# Patient Record
Sex: Female | Born: 1965 | Race: White | Hispanic: No | Smoking: Current every day smoker
Health system: Southern US, Community
[De-identification: ages and names within clinical notes are randomized; demographics above are authoritative.]

## PROBLEM LIST (undated history)

## (undated) DIAGNOSIS — G43909 Migraine, unspecified, not intractable, without status migrainosus: Secondary | ICD-10-CM

## (undated) DIAGNOSIS — IMO0001 Reserved for inherently not codable concepts without codable children: Secondary | ICD-10-CM

## (undated) DIAGNOSIS — E119 Type 2 diabetes mellitus without complications: Secondary | ICD-10-CM

## (undated) DIAGNOSIS — F419 Anxiety disorder, unspecified: Secondary | ICD-10-CM

## (undated) DIAGNOSIS — K219 Gastro-esophageal reflux disease without esophagitis: Secondary | ICD-10-CM

## (undated) HISTORY — DX: Reserved for inherently not codable concepts without codable children: IMO0001

## (undated) HISTORY — DX: Migraine, unspecified, not intractable, without status migrainosus: G43.909

## (undated) HISTORY — DX: Anxiety disorder, unspecified: F41.9

## (undated) HISTORY — DX: Gastro-esophageal reflux disease without esophagitis: K21.9

## (undated) HISTORY — DX: Type 2 diabetes mellitus without complications: E11.9

---

## 1997-08-25 ENCOUNTER — Ambulatory Visit (HOSPITAL_COMMUNITY): Admission: RE | Admit: 1997-08-25 | Discharge: 1997-08-25 | Payer: Self-pay | Admitting: Obstetrics and Gynecology

## 1998-01-19 ENCOUNTER — Inpatient Hospital Stay (HOSPITAL_COMMUNITY): Admission: AD | Admit: 1998-01-19 | Discharge: 1998-01-23 | Payer: Self-pay | Admitting: Obstetrics and Gynecology

## 1999-07-28 ENCOUNTER — Other Ambulatory Visit: Admission: RE | Admit: 1999-07-28 | Discharge: 1999-07-28 | Payer: Self-pay | Admitting: *Deleted

## 2000-07-11 ENCOUNTER — Encounter: Payer: Self-pay | Admitting: Emergency Medicine

## 2000-07-11 ENCOUNTER — Emergency Department (HOSPITAL_COMMUNITY): Admission: EM | Admit: 2000-07-11 | Discharge: 2000-07-11 | Payer: Self-pay | Admitting: Emergency Medicine

## 2001-08-30 ENCOUNTER — Other Ambulatory Visit: Admission: RE | Admit: 2001-08-30 | Discharge: 2001-08-30 | Payer: Self-pay | Admitting: Obstetrics and Gynecology

## 2002-04-30 ENCOUNTER — Encounter (HOSPITAL_COMMUNITY): Payer: Self-pay | Admitting: Oncology

## 2002-04-30 ENCOUNTER — Ambulatory Visit (HOSPITAL_COMMUNITY): Admission: RE | Admit: 2002-04-30 | Discharge: 2002-04-30 | Payer: Self-pay | Admitting: Oncology

## 2002-08-06 ENCOUNTER — Encounter: Admission: RE | Admit: 2002-08-06 | Discharge: 2002-08-06 | Payer: Self-pay | Admitting: Obstetrics and Gynecology

## 2002-08-06 ENCOUNTER — Encounter: Payer: Self-pay | Admitting: Obstetrics and Gynecology

## 2002-10-04 ENCOUNTER — Other Ambulatory Visit: Admission: RE | Admit: 2002-10-04 | Discharge: 2002-10-04 | Payer: Self-pay | Admitting: Obstetrics and Gynecology

## 2003-07-28 ENCOUNTER — Emergency Department (HOSPITAL_COMMUNITY): Admission: EM | Admit: 2003-07-28 | Discharge: 2003-07-28 | Payer: Self-pay | Admitting: Emergency Medicine

## 2003-08-14 ENCOUNTER — Ambulatory Visit (HOSPITAL_COMMUNITY): Admission: RE | Admit: 2003-08-14 | Discharge: 2003-08-15 | Payer: Self-pay | Admitting: Orthopaedic Surgery

## 2003-12-15 ENCOUNTER — Encounter: Admission: RE | Admit: 2003-12-15 | Discharge: 2003-12-15 | Payer: Self-pay | Admitting: Obstetrics and Gynecology

## 2005-03-16 ENCOUNTER — Other Ambulatory Visit: Admission: RE | Admit: 2005-03-16 | Discharge: 2005-03-16 | Payer: Self-pay | Admitting: Obstetrics and Gynecology

## 2005-04-21 ENCOUNTER — Encounter: Admission: RE | Admit: 2005-04-21 | Discharge: 2005-04-21 | Payer: Self-pay | Admitting: Obstetrics and Gynecology

## 2005-04-21 ENCOUNTER — Ambulatory Visit: Payer: Self-pay | Admitting: Gastroenterology

## 2005-04-26 ENCOUNTER — Ambulatory Visit: Payer: Self-pay | Admitting: Gastroenterology

## 2005-05-02 ENCOUNTER — Encounter (INDEPENDENT_AMBULATORY_CARE_PROVIDER_SITE_OTHER): Payer: Self-pay | Admitting: Specialist

## 2005-05-02 ENCOUNTER — Ambulatory Visit: Payer: Self-pay | Admitting: Gastroenterology

## 2005-06-28 ENCOUNTER — Ambulatory Visit: Payer: Self-pay | Admitting: Gastroenterology

## 2006-02-14 ENCOUNTER — Ambulatory Visit (HOSPITAL_COMMUNITY): Admission: RE | Admit: 2006-02-14 | Discharge: 2006-02-14 | Payer: Self-pay | Admitting: Family Medicine

## 2006-08-03 ENCOUNTER — Encounter: Admission: RE | Admit: 2006-08-03 | Discharge: 2006-08-03 | Payer: Self-pay | Admitting: Family Medicine

## 2006-10-11 ENCOUNTER — Encounter (HOSPITAL_COMMUNITY): Admission: RE | Admit: 2006-10-11 | Discharge: 2006-11-10 | Payer: Self-pay | Admitting: Oncology

## 2006-10-11 ENCOUNTER — Ambulatory Visit (HOSPITAL_COMMUNITY): Payer: Self-pay | Admitting: Oncology

## 2007-01-25 ENCOUNTER — Ambulatory Visit (HOSPITAL_COMMUNITY): Admission: RE | Admit: 2007-01-25 | Discharge: 2007-01-25 | Payer: Self-pay | Admitting: Obstetrics and Gynecology

## 2007-01-25 ENCOUNTER — Encounter (INDEPENDENT_AMBULATORY_CARE_PROVIDER_SITE_OTHER): Payer: Self-pay | Admitting: Obstetrics and Gynecology

## 2007-08-14 ENCOUNTER — Encounter: Admission: RE | Admit: 2007-08-14 | Discharge: 2007-08-14 | Payer: Self-pay | Admitting: Family Medicine

## 2009-03-02 ENCOUNTER — Encounter: Admission: RE | Admit: 2009-03-02 | Discharge: 2009-03-02 | Payer: Self-pay | Admitting: Obstetrics and Gynecology

## 2009-04-01 ENCOUNTER — Ambulatory Visit: Payer: Self-pay | Admitting: Oncology

## 2009-04-02 LAB — IRON AND TIBC: UIBC: 350 ug/dL

## 2009-04-02 LAB — CBC & DIFF AND RETIC
Basophils Absolute: 0.1 10*3/uL (ref 0.0–0.1)
Eosinophils Absolute: 0.3 10*3/uL (ref 0.0–0.5)
HCT: 31.3 % — ABNORMAL LOW (ref 34.8–46.6)
HGB: 9.7 g/dL — ABNORMAL LOW (ref 11.6–15.9)
LYMPH%: 21.5 % (ref 14.0–49.7)
MCV: 80.7 fL (ref 79.5–101.0)
MONO#: 0.6 10*3/uL (ref 0.1–0.9)
MONO%: 5.1 % (ref 0.0–14.0)
NEUT#: 8 10*3/uL — ABNORMAL HIGH (ref 1.5–6.5)
NEUT%: 70.5 % (ref 38.4–76.8)
Platelets: 467 10*3/uL — ABNORMAL HIGH (ref 145–400)
RBC: 3.88 10*6/uL (ref 3.70–5.45)
Retic %: 1.97 % — ABNORMAL HIGH (ref 0.50–1.50)
WBC: 11.4 10*3/uL — ABNORMAL HIGH (ref 3.9–10.3)

## 2009-04-02 LAB — CHCC SMEAR

## 2009-04-02 LAB — COMPREHENSIVE METABOLIC PANEL
Albumin: 3.6 g/dL (ref 3.5–5.2)
Alkaline Phosphatase: 71 U/L (ref 39–117)
Calcium: 9.2 mg/dL (ref 8.4–10.5)
Chloride: 107 mEq/L (ref 96–112)
Glucose, Bld: 98 mg/dL (ref 70–99)
Potassium: 3.4 mEq/L — ABNORMAL LOW (ref 3.5–5.3)
Sodium: 139 mEq/L (ref 135–145)
Total Protein: 6.8 g/dL (ref 6.0–8.3)

## 2009-04-02 LAB — FOLATE: Folate: 15.2 ng/mL

## 2009-04-03 LAB — LACTATE DEHYDROGENASE: LDH: 127 U/L (ref 94–250)

## 2009-06-24 ENCOUNTER — Ambulatory Visit: Payer: Self-pay | Admitting: Oncology

## 2010-04-05 ENCOUNTER — Emergency Department (HOSPITAL_COMMUNITY)
Admission: EM | Admit: 2010-04-05 | Discharge: 2010-04-05 | Payer: Self-pay | Source: Home / Self Care | Admitting: Emergency Medicine

## 2010-05-09 ENCOUNTER — Encounter (HOSPITAL_COMMUNITY): Payer: Self-pay | Admitting: Oncology

## 2010-05-10 ENCOUNTER — Encounter: Payer: Self-pay | Admitting: Obstetrics and Gynecology

## 2010-08-11 ENCOUNTER — Inpatient Hospital Stay (HOSPITAL_COMMUNITY)
Admission: AD | Admit: 2010-08-11 | Discharge: 2010-08-11 | Disposition: A | Discharge status: Home / Self Care | Payer: 59 | Source: Ambulatory Visit | Attending: Obstetrics and Gynecology | Admitting: Obstetrics and Gynecology

## 2010-08-11 ENCOUNTER — Inpatient Hospital Stay (HOSPITAL_COMMUNITY): Payer: 59

## 2010-08-11 DIAGNOSIS — D649 Anemia, unspecified: Secondary | ICD-10-CM | POA: Insufficient documentation

## 2010-08-11 DIAGNOSIS — N938 Other specified abnormal uterine and vaginal bleeding: Secondary | ICD-10-CM | POA: Insufficient documentation

## 2010-08-11 DIAGNOSIS — N949 Unspecified condition associated with female genital organs and menstrual cycle: Secondary | ICD-10-CM | POA: Insufficient documentation

## 2010-08-11 LAB — COMPREHENSIVE METABOLIC PANEL
ALT: 18 U/L (ref 0–35)
Albumin: 3.5 g/dL (ref 3.5–5.2)
Calcium: 9.2 mg/dL (ref 8.4–10.5)
GFR calc non Af Amer: >60 mL/min (ref >60)
Potassium: 3.9 mEq/L (ref 3.5–5.1)
Total Bilirubin: 0.6 mg/dL (ref 0.3–1.2)

## 2010-08-11 LAB — CBC
HCT: 30.7 % — ABNORMAL LOW (ref 36.0–46.0)
Hemoglobin: 9.8 g/dL — ABNORMAL LOW (ref 12.0–15.0)
MCV: 85 fL (ref 78.0–100.0)
RBC: 3.61 MIL/uL — ABNORMAL LOW (ref 3.87–5.11)

## 2010-08-23 ENCOUNTER — Encounter (HOSPITAL_COMMUNITY): Payer: 59

## 2010-08-23 ENCOUNTER — Other Ambulatory Visit: Payer: Self-pay | Admitting: Obstetrics and Gynecology

## 2010-08-23 LAB — SURGICAL PCR SCREEN: Staphylococcus aureus: NEGATIVE

## 2010-08-26 ENCOUNTER — Other Ambulatory Visit: Payer: Self-pay | Admitting: Obstetrics and Gynecology

## 2010-08-26 ENCOUNTER — Ambulatory Visit (HOSPITAL_COMMUNITY)
Admission: AD | Admit: 2010-08-26 | Discharge: 2010-08-27 | Disposition: A | Discharge status: Home / Self Care | Payer: 59 | Source: Ambulatory Visit | Attending: Obstetrics and Gynecology | Admitting: Obstetrics and Gynecology

## 2010-08-26 DIAGNOSIS — N8300 Follicular cyst of ovary, unspecified side: Secondary | ICD-10-CM | POA: Insufficient documentation

## 2010-08-26 DIAGNOSIS — D649 Anemia, unspecified: Secondary | ICD-10-CM | POA: Insufficient documentation

## 2010-08-26 DIAGNOSIS — Z01818 Encounter for other preprocedural examination: Secondary | ICD-10-CM | POA: Insufficient documentation

## 2010-08-26 DIAGNOSIS — D250 Submucous leiomyoma of uterus: Secondary | ICD-10-CM | POA: Insufficient documentation

## 2010-08-26 DIAGNOSIS — N8000 Endometriosis of the uterus, unspecified: Secondary | ICD-10-CM | POA: Insufficient documentation

## 2010-08-26 DIAGNOSIS — N801 Endometriosis of ovary: Secondary | ICD-10-CM | POA: Insufficient documentation

## 2010-08-26 DIAGNOSIS — N920 Excessive and frequent menstruation with regular cycle: Secondary | ICD-10-CM | POA: Insufficient documentation

## 2010-08-26 DIAGNOSIS — N80109 Endometriosis of ovary, unspecified side, unspecified depth: Secondary | ICD-10-CM | POA: Insufficient documentation

## 2010-08-26 DIAGNOSIS — Z01812 Encounter for preprocedural laboratory examination: Secondary | ICD-10-CM | POA: Insufficient documentation

## 2010-08-26 HISTORY — PX: LAPAROSCOPIC VAGINAL HYSTERECTOMY: SUR798

## 2010-08-26 LAB — CBC
Hemoglobin: 9.2 g/dL — ABNORMAL LOW (ref 12.0–15.0)
RBC: 3.39 MIL/uL — ABNORMAL LOW (ref 3.87–5.11)
RDW: 16.6 % — ABNORMAL HIGH (ref 11.5–15.5)
WBC: 11.5 10*3/uL — ABNORMAL HIGH (ref 4.0–10.5)

## 2010-08-26 NOTE — H&P (Signed)
NAMEKERRILYN, Tracy NO.:  [id number]  MEDICAL RECORD NO.:  [id number]           PATIENT TYPE:  LOCATION:                                 FACILITY:  PHYSICIAN:  Janine Limbo, M.D.DATE OF BIRTH:  02/05/1966  DATE OF ADMISSION:  08/26/2010 DATE OF DISCHARGE:                             HISTORY & PHYSICAL   HISTORY OF PRESENT ILLNESS:  [name] is a 45 year old female, para 1-0-0-1, who presents for a laparoscopy assisted vaginal hysterectomy.  She will also have a left ovarian cystectomy and lysis of adhesions.  She will have cystoscopy.  The patient has been followed at the [facility]. The patient complains of prolonged irregular uterine bleeding.  She has not responded to hormonal therapy.  The patient has a known history of endometriosis and a known history of pelvic adhesions.  In 2008, the patient had a diagnostic laparoscopy with laparoscopic lysis of adhesions and laparoscopic left ovarian cystectomy.  Operative findings at that time showed a uterus that was upper limits of normal size. There were dense adhesions present between the left ovary and the posterior uterus.  There are also dense adhesions present at the colon. There was an endometrioma present in the left ovary.  The patient has had an ultrasound performed which showed the uterus to measure 11.7 x 5.8 cm.  There was a 9.8 x 6.9 cm cystic structure in the left ovary that again appears to be consistent with endometriosis.  At this point, the patient wishes to proceed with hysterectomy. Her endometrial biopsy  showed benign cells.  OBSTETRICAL HISTORY:  The patient has had one term cesarean section. The pregnancy was complicated by severe postpartum depression and anxiety.  PAST MEDICAL HISTORY:  The patient complains of anxiety.  She also has esophageal reflux disease.  MEDICATIONS:  Her current medications include  Provera, Zoloft, Nexium, and iron.  DRUG ALLERGIES:  PENICILLIN causes an anaphylactic reaction.  SOCIAL HISTORY:  The patient drinks alcohol socially.  She denies cigarette use and recreational drug use.  She works as a Engineer, civil (consulting).  She is currently married.  REVIEW OF SYSTEMS:  Please see history of present illness.  FAMILY HISTORY:  The patient's mother had breast cancer.  PHYSICAL EXAMINATION:  VITAL SIGNS:  Height is 5 feet 1 inch, weight is 191 pounds. HEENT:  Within normal limits. CHEST:  Clear. HEART:  Regular rate and rhythm. BREASTS:  Without masses. ABDOMEN:  Nontender. EXTREMITIES:  Grossly normal. NEUROLOGIC:  Grossly normal. PELVIC:  External genitalia is normal.  Vagina is normal except for large amount of blood.  The cervix is nontender.  The uterus is normal size, shape, and consistency.  Adnexa, there is a cystic mass in the left adnexa and rectovaginal exam confirms.  LABORATORY VALUES:  Hemoglobin is 9.8 (down from 11 several days earlier).  ASSESSMENT: 1. Menorrhagia. 2. Endometriosis. 3. Pelvic adhesions.4. Left endometrioma 5. Anxiety.  PLAN:  The patient will undergo a laparoscopy assisted vaginal hysterectomy.  She will undergo a laparoscopic lysis of adhesions.  She understands that we may not be able to perform  her procedure using the laparoscope and that we may need to proceed with the abdominal approach. We will perform cystoscopy at the end of our procedure.  The patient understands the indications for her surgical procedure as well as her alternative treatment options.  She accepts the risks of, but not limited to, anesthetic complications, bleeding, infections, and possible damage to the surrounding organs.  The patient has had a bowel prep in preparation for her surgery.  We had a long discussion about management of her ovaries given her history of severe endometriosis.  The patient elects to maintain her ovaries, and she understands that  there is the potential for future ovarian surgery and pain.     Janine Limbo, M.D.     AVS/MEDQ  D:  08/24/2010  T:  08/25/2010  Job:  [id number]  Electronically Signed by Kirkland Hun M.D. on 08/26/2010 03:28:49 AM

## 2010-08-27 LAB — CBC
MCV: 89.6 fL (ref 78.0–100.0)
WBC: 12.7 10*3/uL — ABNORMAL HIGH (ref 4.0–10.5)

## 2010-08-30 LAB — CROSSMATCH
ABO/RH(D): A POS
Unit division: 0

## 2010-08-31 NOTE — H&P (Signed)
NAMEWYONIA, Dennis NO.:  [id number]   MEDICAL RECORD NO.:  [id number]          PATIENT TYPE:  AMB   LOCATION:  SDC                           FACILITY:  [facility]   PHYSICIAN:  Janine Limbo, M.D.DATE OF BIRTH:  [birth year]   DATE OF ADMISSION:  DATE OF DISCHARGE:                              HISTORY & PHYSICAL   HISTORY OF PRESENT ILLNESS:  [name] is a 45 year old female,  para 1-0-0-1, who presents for diagnostic laparoscopy with possible left  laparoscopic oophorectomy versus left laparoscopic ovarian cystectomy.  The patient has been followed at the [facility] Division of  [facility].  The patient presented in September 2008  complaining of abdominal pain.  A CT scan by her family physician showed  a left pelvic mass.  An ultrasound was performed that showed a 7 cm  cystic mass, but then a follow-up ultrasound showed a 7 cm mixed cystic  mass that was consistent with an endometrioma.  The patient had a CA-125  test performed that was 83.3.  The patient's last Pap smear was within  normal limits.  The patient denies GI and GU complaints.  She did have a  negative Hemoccult.   OBSTETRICAL HISTORY:  The patient had one term vaginal delivery in 1999.  That pregnancy was followed by severe postpartum depression and anxiety.   PAST MEDICAL HISTORY:  The patient has a history of postpartum  depression and subsequent depression.  She also has anxiety.  She  currently takes Zoloft 100 mg each day.  She also has a history of  gastroesophageal reflux disease and she takes Protonix.  She takes  Ativan p.r.n.  The patient had some atypical chest pain in July 2008 and  her cardiac workup was negative.   DRUG ALLERGIES:  PENICILLIN.   SOCIAL HISTORY:  The patient denies cigarette use, alcohol use, and  recreational drug use.  She is a Engineer, civil (consulting), and she is married.   REVIEW OF SYSTEMS:  The patient complains of abdominal  bloating.   FAMILY HISTORY:  Mother had breast cancer.   PHYSICAL EXAMINATION:  VITAL SIGNS:  Weight is 172 pounds, height is 5  feet 1 inch.  HEENT is within normal limits.  CHEST:  Clear.  HEART:  Regular rate and rhythm.  BREASTS:  Without masses.  ABDOMEN:  Nontender and there is no fluid wave present.  EXTREMITIES:  Grossly normal.  NEUROLOGIC:  Grossly normal.  PELVIC EXAM:  External genitalia is normal.  Vagina is normal.  Cervix  is nontender.  Uterus is normal size, shape and consistency.  Adnexa:  There is fullness present in the cul-de-sac and rectovaginal exam  confirms.  Hemoccult is negative.   ASSESSMENT:  Left ovarian cyst.   PLAN:  A consultation was obtained from a GYN oncologist, and it was  believed that the patient would be a candidate for laparoscopic  oophorectomy.  The thought was that should an occult malignancy be  identified, then she can be evaluated and operated on by a GYN  oncologist at a later time.  The patient understands  the indications for  her surgical procedure, and she accepts the risks of, but not limited  to, anesthetic complications, bleeding, infection, and possible damage  to surrounding organs.      Janine Limbo, M.D.  Electronically Signed     AVS/MEDQ  D:  01/18/2007  T:  01/19/2007  Job:  [id number]

## 2010-08-31 NOTE — Op Note (Signed)
[name], Tracy NO.:  [id number]   MEDICAL RECORD NO.:  [id number]          PATIENT TYPE:  AMB   LOCATION:  SDC                           [facility]ACILITY:  [facility]   PHYSICIAN:  Janine Limbo, M.D.DATE OF BIRTH:  April 28, 1965   DATE OF PROCEDURE:  01/25/2007  DATE OF DISCHARGE:                               OPERATIVE REPORT   PREOPERATIVE DIAGNOSIS:  1. Left ovarian cyst.  2. Pelvic pain.   POSTOPERATIVE DIAGNOSIS:  1. Left ovarian cyst.  2. Pelvic pain.  3. Endometriosis stage IV.   PROCEDURE:  1. Diagnostic laparoscopy.  2. Laparoscopic lysis of adhesions.  3. Laparoscopic left ovarian cystectomy.   SURGEON:  Janine Limbo, M.D.   FIRST ASSISTANT:  None.   ANESTHESIA:  General.   DISPOSITION:  [name] is a 45 year old female, para 1-0-0-1, who  presents with the above mentioned diagnosis.  She has had a CA-125 that  was 83.3.  We did consult with a GYN oncologist and they recommended  that we proceed with laparoscopic left ovarian cystectomy given that the  majority of the findings suggested a benign process.  The patient  understands the indications for her surgical procedure and she accepts  the risks of, but not limited to, anesthetic complications, bleeding,  infection, and possible damage to surrounding organs.  She understands  that if a malignancy is found then we will need to proceed with a second  procedure.  The plan is to obtain a frozen section sample during our  operative procedure and that will help direct [place] as to whether or not we  can perform an ovarian cystectomy only or whether or not an oophorectomy  would need to be performed.   FINDINGS:  The uterus was upper limits normal size.  There were dense  adhesions present between the left ovary, the left fallopian tube, the  posterior uterus, and the colon.  The left fallopian tube appeared  normal other than the fact that it was adhered to the left ovary and it  was  moderately edematous.  The right fallopian tube and the right ovary  appeared normal.  With the first inspection of the abdomen, there was  chocolate appearing fluid present throughout the pelvis and there were  dense adhesions between the omentum, the anterior abdominal wall, the  anterior uterus, and the bladder.  The posterior cul-de-sac had  moderately dense adhesions present.  There was a large cystic structure  that had ruptured from the left ovary.  It measured approximately 8 cm  in size.  The inner wall of this cystic structure was consistent with an  endometrioma.   OPERATIVE PROCEDURE:  The patient was taken to the operating room where  a general anesthetic was given.  The patient's abdomen, perineum, and  vagina were prepped with multiple layers of Betadine.  A Foley catheter  was placed in the bladder.  Examination under anesthesia was performed.  A Hulka tenaculum was placed inside the uterus.  The patient was then  sterilely draped.  The subumbilical area was injected with 3 mL of 0.5%  Marcaine with  epinephrine.  A subumbilical incision was made and carried  sharply through the subcutaneous tissue, fascia, and the anterior  peritoneum.  The Hassan cannula was sutured into place using 0 Vicryl.  The laparoscope was inserted.  A pneumoperitoneum was obtained.  The  pelvis was visualized with findings as mentioned above.  Pictures were  taken.  Two suprapubic areas were injected with an additional 6 mL of  0.5% Marcaine with epinephrine.  Two 5 mm trocars were placed in the  lower abdomen under direct visualization.  The pelvis was vigorously  irrigated.  The old blood was removed.   The adhesions between the omentum, the anterior uterus, the anterior  peritoneum, and the bladder flap were then lysed using sharp dissection.  The adhesions between the colon and the left ovarian cyst were then  lysed.  We were then able to safely retract the colon cephalad.  The  large left  ovarian cyst was brought into view.  A combination of sharp  and blunt dissection were used to remove the cystic structure from the  colon and the posterior uterus.  We then lysed the adhesions between the  cystic structure and the left fallopian tube.  Once the cyst was freed,  we then dissected the cystic structure from the base of the ovary.  A  combination of sharp and blunt dissection were used.  The cyst was  removed from the abdominal cavity through the umbilical port.  The  cystic structure was sent to pathology and it was said to appeared  benign.  They could not definitively say that this was endometriosis,  however.   We then obtained hemostasis in the bed of the left ovary from where the  cyst was removed.  Bipolar cautery was used.  Care was taken not to  damage the bowel or any of the other vital pelvic structures.  The  pelvis was vigorously irrigated.  Hemostasis was confirmed.  The bowel  was carefully inspected and there was no evidence of damage to the  bowel.  We felt that any additional lysis of adhesions or resection of  endometriosis was beyond the scope of this current procedure.  The  suprapubic trocars were then removed under direct visualization.  Again,  hemostasis was confirmed.  The Hassan cannula was removed after all the  gas had been released.  The subumbilical fascia was closed using figure-  of-eight sutures of 0 Vicryl.  The subcutaneous layer was closed using  interrupted sutures of 0 Vicryl.  The skin at the subumbilical incision  was closed using a running suture of 4-0 Monocryl.  The suprapubic  incisions were closed using interrupted sutures of 4-0 Monocryl.  Sponge, needle, and instrument counts were correct.  The estimated blood  loss for the procedure was 100 mL.  The patient tolerated the procedure  well.  The estimated operative time was approximately 2 1/2 hours.  The  patient was awakened from her anesthetic without difficulty and taken to   the recovery room in stable condition.  The patient received Toradol 30  mg IV and 30 mg IM during the operative procedure.  She will receive  clindamycin in the recovery room.   FOLLOW-UP INSTRUCTIONS:  The patient will take Vicodin 1-2 tablets every  4 hours as needed for severe pain.  She will take ibuprofen 600 mg every  6 hours as needed for mild to moderate pain.  The patient will take  ciprofloxacin 500 mg twice a day for 7  days.  She will call for  questions or concerns.  We will consider treating the patient with Depo-  Lupron for 3-4 months.  If she continues to have discomfort, then we  will discuss total abdominal hysterectomy and bilateral salpingo-  oophorectomy.  The patient was given a copy of the postoperative  instruction sheet as prepared by the [facility] for  patients who have undergone a laparoscopy.      Janine Limbo, M.D.  Electronically Signed     AVS/MEDQ  D:  01/25/2007  T:  01/25/2007  Job:  [id number]

## 2010-09-01 NOTE — Discharge Summary (Signed)
NAMEMARGIT, [name]             ACCOUNT NO.:  [id number]  MEDICAL RECORD NO.:  [id number]           PATIENT TYPE:  O  LOCATION:  9319                          FACILITY:  [facility]  PHYSICIAN:  Janine Limbo, M.D.DATE OF BIRTH:  09-15-1965  DATE OF ADMISSION:  08/26/2010 DATE OF DISCHARGE:  08/27/2010                              DISCHARGE SUMMARY   ADMISSION DIAGNOSES: 1. Menorrhagia. 2. Stage IV endometriosis. 3. Pelvic adhesions. 4. Anemia (hemoglobin 9.2). 5. Left ovarian endometrioma. 6. Body mass index equals 36. 7. Anxiety disorder.  POSTOPERATIVE DIAGNOSES: 1. Menorrhagia. 2. Stage IV endometriosis. 3. Pelvic adhesions. 4. Anemia (hemoglobin 6.7). 5. Left ovarian endometrioma. 6. Body mass index equals 36. 7. Anxiety disorder.  PROCEDURES THIS ADMISSION: 1. Laparoscopy assisted vaginal hysterectomy. 2. Laparoscopic lysis of adhesions. 3. Laparoscopic left salpingo-oophorectomy. 4. Cystoscopy. 5. Insufflation of the rectum.  HISTORY OF PRESENT ILLNESS:  [name] is a 45 year old female, para 1-0-0-1, who presents with the above-mentioned diagnoses.  She has not responded to hormonal therapy.  She wishes to proceed with definitive therapy.  Please see her dictated history and physical exam for details.  ADMISSION EXAM:  The patient was noted to have a uterus that was upper limits normal size and a large left adnexal mass.  HOSPITAL COURSE:  On the day of admission, the patient was taken to the operating room for laparoscopy assisted vaginal hysterectomy.  The patient was found to have a uterus that was 160 grams.  She was also found to have a 8-cm left ovarian endometrioma.  There were dense adhesions present between the bowel and the left posterior uterus. There were adhesions present in the anterior cul-de-sac.  We were able to successfully lyse the adhesions.  The patient tolerated her procedure well, although it did last 4 hours.  Cystoscopy  confirmed that there was no damage to the urinary tract system.  We insufflated the rectum and there was no apparent damage to the bowel system.  The patient's postoperative course was uneventful.  She remained afebrile.  She quickly tolerated her regular diet.  Her postoperative hemoglobin was 6.7 (preoperative hemoglobin 9.2).  Orthostatics were negative.  The patient was discharged to home on postoperative day #1 doing well.  DISCHARGE INSTRUCTIONS:  The patient will return to see Dr. Stefano Gaul in 6 weeks for followup examination.  She was given a copy of the postoperative instructions as prepared by the [facility] Division of [facility].  She will refrain from driving for 1 week, heavy lifting for 4 weeks, and intercourse for 6 weeks.  She will call for questions or concerns.  DISCHARGE MEDICATIONS: 1. Motrin 800 mg every 8 hours as needed for mild-to-moderate pain. 2. Percocet 5/325, 1-2 tablets every 4 hours as needed for severe     pain. 3. Phenergan 25 mg every 6 hours as needed for nausea. 4. Iron twice each day.  The patient will continue her preoperative     medications but will discontinue her Provera at this point.  Final pathology report is pending at the time of this discharge.     Janine Limbo,  M.D.     AVS/MEDQ  D:  08/27/2010  T:  08/27/2010  Job:  [id number]  Electronically Signed by Kirkland Hun M.D. on 09/01/2010 08:47:52 AM

## 2010-09-01 NOTE — Op Note (Signed)
[name], [name] NO.:  [id number]  MEDICAL RECORD NO.:  [id number]           PATIENT TYPE:  O  LOCATION:  9319                          FACILITY:  [facility]  PHYSICIAN:  Janine Limbo, M.D.DATE OF BIRTH:  August 14, 1965  DATE OF PROCEDURE:  08/26/2010 DATE OF DISCHARGE:                              OPERATIVE REPORT   PREOPERATIVE DIAGNOSES: 1. Menorrhagia 2. Anemia (hemoglobin 9.8) 3. History of stage IV endometriosis. 4. Pelvic adhesions. 5. Left ovarian endometrioma. 6. Body mass index equals 36.  POSTOPERATIVE DIAGNOSES: 1. Menorrhagia 2. Anemia (hemoglobin 9.8) 3. History of stage IV endometriosis. 4. Pelvic adhesions. 5. Left ovarian endometrioma. 6. Body mass index equals 36. 7. Fibroid uterus.  PROCEDURE: 1. Laparoscopy assisted vaginal hysterectomy. 2. Laparoscopic lysis of adhesions. 3. Laparoscopic left salpingo-oophorectomy. 4. Cystoscopy. 5. Insufflation of the rectum.  SURGEON:  Dr. Lafayette Dragon Miquel Stacks  FIRST ASSISTANT:  Dr. Pierre Bali. Dillard  ANESTHETIC:  General.  DISPOSITION:  [name] is a 45 year old female, para 1-0-0-1, who presents with the above-mentioned diagnosis.  Hormonal therapy has not relieved her discomfort.  She understands the indications for her surgical procedure as well as the alternative treatment options.  She accepts the risks of, but not limited to, anesthetic complications, bleeding, infection, and possible damage to the surrounding organs.  She wishes to maintain at least one ovary.  She understands that there is the possibility she may have discomfort from endometriosis in the future and that future surgery may be required.  FINDINGS:  The uterus weighed approximately 160 grams.  The patient was noted to have an 8 cm left endometrioma.  She had dense adhesions between the left ovary, the left posterior uterus, and the rectum.  She had moderate adhesions between the anterior cul-de-sac and  the anterior uterine segment (prior cesarean delivery).  There were adhesions present in the posterior cul-de-sac to the right of the midline.  The right fallopian tube and the right ovary appeared normal.  The appendix and the bowel appeared normal except for the previously mentioned adhesions. The upper abdomen appeared normal.  There was a 2-cm submucosal fibroid present.  On cystoscopy, the ureters were noted to be patent and blue dye quickly passed through the ureteral orifices.  There were no lesions noted within the bladder.  The rectum was filled with sterile milk and there was no evidence of leakage into the peritoneal cavity.  The surgery start time was 7:53 a.m.  The surgery end time was 11:55 a.m.  PROCEDURE:  The patient was taken to the operating room where a general anesthetic was given.  The patient's abdomen was prepped with ChloraPrep.  The perineum and vagina were prepped with multiple layers of Betadine.  A Foley catheter was placed in the bladder.  Examination under anesthesia was performed.  The patient then had a Hulka tenaculum placed inside the uterus.  The patient was sterilely draped.  The subumbilical area was injected with 0.5% Marcaine with epinephrine.  A subumbilical incision was made and the incision was carried sharply through the subcutaneous tissue, the fascia, and the anterior peritoneum.  The Hasson cannula was sutured  into place using 0 Vicryl. A pneumoperitoneum was obtained.  The pelvis was carefully inspected with findings as mentioned above.  The lower abdomen was then injected in 3 different areas (midline, left of the midline, and right of the midline).  Marcaine 0.5% with epinephrine were used.  Three 5-mm trocars were placed in the lower abdomen under direct visualization.  We attempted to take pictures of the patient's pelvic anatomy, but two of our cameras did not function properly.  We then carefully performed a lysis of adhesions using  sharp and blunt dissection.  As we attempted to dissect the endometrioma off the left posterior uterus, the endometrioma drained.  The fluid was then aspirated using the Nezhat irrigator.  We then used careful sharp dissection to dissect the bowel off the left posterior uterus.  Care was taken not to damage the bowel.  There was no evidence of damage to the serosal surface.  Once the bowel was dissected off the posterior uterus, we then cauterized the left round ligament. The round ligament was cut.  The left fallopian tube and then left utero- ovarian ligament were then cauterized and cut.  The pelvic sidewall was inspected and we felt that we could safely ligate the infundibulopelvic ligament on the left.  Two Endoloops of 0 Vicryl were placed on the left infundibulopelvic ligament.  The ligament was cut, and the specimen was placed in the posterior cul-de-sac.  Hemostasis was achieved using bipolar cautery.  We felt that the posterior cul-de-sac was adequately free so that we could proceed with the vaginal portion of our procedure. The bladder flap was developed anteriorly using the laparoscopic scissors.  At this point, the patient was placed in a more lithotomy position.  The cervix was injected with a diluted solution of Pitressin and saline.  A circumferential incision was made around the cervix and the vaginal mucosa was advanced anteriorly and posteriorly.  The anterior cul-de-sac and then the posterior cul-de-sac were sharply entered.  Alternating from right to left, the uterosacral ligaments, paracervical tissues, parametrial tissues, and uterine arteries were clamped, cut, sutured, and tied securely.  The uterus was inverted through the posterior colpotomy.  The right upper pedicle was then clamped and cut.  The uterus was removed from the operative field.  The right upper pedicle was secured using a free tie and then suture ligatures.  The suture was attached to the  uterosacral ligaments, were brought out through the vaginal angles and tied securely.  Bleeding was noted at the vaginal cuff and hemostasis was achieved using figure-of- eight sutures.  A McCall culdoplasty suture was placed in the posterior cul-de-sac incorporating the uterosacral ligaments bilaterally and the posterior peritoneum.  A final check was made for hemostasis and hemostasis was felt to be adequate.  The vaginal cuff was closed using figure-of-eight sutures.  The McCall culdoplasty suture was tied securely and the apex of the vagina was noted to elevate into the mid pelvis.  The estimated blood loss to this point was thought to be approximately 600 mL.  The patient was given an ampule of indigo carmine dye.  The Foley catheter was removed and the cystoscope was inserted. Blue dye quickly spilled through both ureteral orifices.  The cystoscope was removed and the Foley catheter was replaced under sterile conditions.  The patient was then placed in a more supine position.  The operator changed gown and gloves.  The pneumoperitoneum was reestablished.  The pelvis was carefully inspected and hemostasis was noted to be  adequate.  The pelvis was vigorously irrigated.  We then insufflated the rectum using sterile milk.  The rectum was noted to distend.  No leakage was noted into the peritoneal cavity.  At this point, we felt that we were ready to terminate our procedure.  The lower abdominal trocars were removed under direct visualization.  The pneumoperitoneum was allowed to escape.  The subumbilical trocar was removed.  The subumbilical fascia was closed using figure-of-eight sutures of 0 Vicryl.  All incisions were closed using subcuticular sutures of 3-0 Monocryl.  Dermabond was placed over the abdominal incisions.  The patient was awakened from her anesthetic without difficulty and she was transported to the recovery room in stable condition.  The uterus, left fallopian tube,  and the left ovary were all sent to Pathology for evaluation.  Sponge, needle, and instrument counts were correct.  The total estimated blood loss was 600 mL.  The patient was noted to drain blue colored but clear urine.  She tolerated her procedure well.  The patient received 30 mg of Toradol intramuscularly and 30 mg of Toradol intravenously during the operative procedure.     Janine Limbo, M.D.     AVS/MEDQ  D:  08/26/2010  T:  08/27/2010  Job:  [id number]  Electronically Signed by Kirkland Hun M.D. on 09/01/2010 08:47:40 AM

## 2010-09-03 NOTE — Op Note (Signed)
[name], [name]                       ACCOUNT NO.:  [id number]   MEDICAL RECORD NO.:  [id number]                   PATIENT TYPE:  OIB   LOCATION:  2899                                 FACILITY:  [facility]   PHYSICIAN:  Sharolyn Douglas, M.D.                     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   PROCEDURE:  1. Anterior cervical diskectomy C5-6.  2. Anterior cervical arthrodesis C5-6 with placement of an allograft     prosthesis spacer packed with local autologous bone graft.  3. Anterior cervical plating C5-6 utilizing the spinal concept system.   SURGEON:  Sharolyn Douglas, M.D.   ASSISTANT:  LMR, PA   ANESTHESIA:  General endotracheal.   COMPLICATIONS:  None.   INDICATIONS FOR PROCEDURE:  The patient is a 45 year old female with severe  left upper extremity C6 radiculopathy secondary to a large HNP.  She has not  responded to conservative treatment.  Her pain is intractable and she has  developed weakness in the left wrist extensors.  She has elected to undergo  ACDF in hopes of improving her symptoms and returning to normal activities.   DESCRIPTION OF PROCEDURE:  The patient was properly identified in the  holding area and taken to the operating room.  She underwent general  endotracheal anesthesia without difficulty.  She was given prophylactic IV  antibiotics.  She was carefully positioned supine on the operating room  table with a Mayfield headrest.  Her neck was placed in slight extension.  Five pounds of Halter traction applied.  Neck prepped and draped in the  usual sterile fashion.  A 4 cm incision was made on the left side of the  neck in a natural skin crease just above the cricoid cartilage.  Dissection  was carried sharply through the platysma.  The interval between the SCM and  strap muscles was developed down to the prevertebral space.  The esophagus,  trachea and carotid sheath were identified and protected at all times.  The  C5-6 disc space was identified by anterior osteophytes.  A spinal needle was  placed confirming this location.  The longus coli muscle elevated out over  the C5-6 disc space bilaterally.  Deep shadowline retractor placed.  Sharp  annulotomy performed.  Diskectomy carried back to the posterior longitudinal  ligament.  Caspar distraction pins were placed in the C5-6 vertebral body.  General distraction was applied.  High speed bur used to remove the  cartilaginous end plates.  The posterior longitudinal ligament was  inspected.  Several rents were found near the left foramen as well as in the  midline.  Disc material was protruding through the small rents.  The  posterior longitudinal ligament was taken down  from the midline out to the  left foramen.  Several large disc fragments were extracted from the spinal  canal using a blunt probe.  The foramen was explored and a large disc  fragment was removed.  The wound was irrigated.  Bleeding was controlled  with bipolar electrocautery and Gelfoam.  We then placed a 7 mm allograft  prosthesis spacer packed with local autologous bone graft collected from the  bur shavings.  This was carefully countersunk 1 mm.  We checked posteriorly.  There was a space with a blunt probe.  We then placed a 22 mm anterior  cervical plate with four 12 mm screws.  We had good screw purchase.  We  insured that the locking mechanism engaged.  The wound was irrigated.  Bleeding was controlled by bipolar electrocautery and Gelfoam.  Deep Penrose  drain left.  Platysma closed with interrupted 2-0 Vicryl, the subcutaneous  layer closed with 3-0 Vicryl, followed by a running 4-0 subcuticular suture  on the skin.  Benzoin and Steri-Strips were placed.  Soft collar placed.  The patient was extubated without difficulty,  transferred to the recovery  room in stable condition, able to move her upper and lower extremities.                                               Sharolyn Douglas, M.D.    MC/MEDQ  D:  08/14/2003  T:  08/14/2003  Job:  [id number]

## 2010-09-06 ENCOUNTER — Observation Stay (HOSPITAL_COMMUNITY): Payer: 59

## 2010-09-06 ENCOUNTER — Inpatient Hospital Stay (HOSPITAL_COMMUNITY)
Admission: AD | Admit: 2010-09-06 | Discharge: 2010-09-09 | DRG: 982 | Disposition: A | Discharge status: Home / Self Care | Payer: 59 | Source: Ambulatory Visit | Attending: Obstetrics and Gynecology | Admitting: Obstetrics and Gynecology

## 2010-09-06 DIAGNOSIS — N731 Chronic parametritis and pelvic cellulitis: Secondary | ICD-10-CM | POA: Diagnosis present

## 2010-09-06 DIAGNOSIS — R1032 Left lower quadrant pain: Secondary | ICD-10-CM | POA: Diagnosis present

## 2010-09-06 DIAGNOSIS — D649 Anemia, unspecified: Secondary | ICD-10-CM | POA: Diagnosis present

## 2010-09-06 DIAGNOSIS — IMO0002 Reserved for concepts with insufficient information to code with codable children: Principal | ICD-10-CM | POA: Diagnosis present

## 2010-09-06 DIAGNOSIS — Y838 Other surgical procedures as the cause of abnormal reaction of the patient, or of later complication, without mention of misadventure at the time of the procedure: Secondary | ICD-10-CM | POA: Diagnosis present

## 2010-09-06 DIAGNOSIS — T8140XA Infection following a procedure, unspecified, initial encounter: Secondary | ICD-10-CM | POA: Diagnosis present

## 2010-09-06 LAB — DIFFERENTIAL
Basophils Relative: 0 % (ref 0–1)
Eosinophils Absolute: 0.1 10*3/uL (ref 0.0–0.7)
Eosinophils Relative: 1 % (ref 0–5)
Lymphocytes Relative: 9 % — ABNORMAL LOW (ref 12–46)
Lymphs Abs: 1.6 10*3/uL (ref 0.7–4.0)
Monocytes Absolute: 0.9 10*3/uL (ref 0.1–1.0)
Neutrophils Relative %: 85 % — ABNORMAL HIGH (ref 43–77)

## 2010-09-06 LAB — BASIC METABOLIC PANEL WITH GFR
BUN: 12 mg/dL (ref 6–23)
CO2: 21 meq/L (ref 19–32)
Calcium: 9.2 mg/dL (ref 8.4–10.5)
Chloride: 96 meq/L (ref 96–112)
Creatinine, Ser: 0.72 mg/dL (ref 0.4–1.2)
GFR calc non Af Amer: >60 mL/min
Glucose, Bld: 163 mg/dL — ABNORMAL HIGH (ref 70–99)
Potassium: 3.5 meq/L (ref 3.5–5.1)
Sodium: 133 meq/L — ABNORMAL LOW (ref 135–145)

## 2010-09-06 LAB — CBC
MCHC: 30.3 g/dL (ref 30.0–36.0)
MCV: 86.7 fL (ref 78.0–100.0)
RDW: 15.5 % (ref 11.5–15.5)
WBC: 18.2 10*3/uL — ABNORMAL HIGH (ref 4.0–10.5)

## 2010-09-06 MED ORDER — IOHEXOL 300 MG/ML  SOLN
100.0000 mL | Freq: Once | INTRAMUSCULAR | Status: AC | PRN
  Administered 2010-09-06: 100 mL via INTRAVENOUS

## 2010-09-07 ENCOUNTER — Observation Stay (HOSPITAL_COMMUNITY): Payer: 59

## 2010-09-07 DIAGNOSIS — N731 Chronic parametritis and pelvic cellulitis: Secondary | ICD-10-CM

## 2010-09-07 LAB — DIFFERENTIAL
Basophils Absolute: 0.1 10*3/uL (ref 0.0–0.1)
Eosinophils Relative: 2 % (ref 0–5)
Lymphocytes Relative: 9 % — ABNORMAL LOW (ref 12–46)
Lymphs Abs: 1 10*3/uL (ref 0.7–4.0)
Neutro Abs: 9.4 10*3/uL — ABNORMAL HIGH (ref 1.7–7.7)
Neutrophils Relative %: 81 % — ABNORMAL HIGH (ref 43–77)

## 2010-09-07 LAB — GRAM STAIN

## 2010-09-07 LAB — CBC
HCT: 21.2 % — ABNORMAL LOW (ref 36.0–46.0)
Hemoglobin: 6.6 g/dL — CL (ref 12.0–15.0)
MCV: 86.5 fL (ref 78.0–100.0)
RBC: 2.45 MIL/uL — ABNORMAL LOW (ref 3.87–5.11)
WBC: 11.5 10*3/uL — ABNORMAL HIGH (ref 4.0–10.5)

## 2010-09-08 ENCOUNTER — Inpatient Hospital Stay (HOSPITAL_COMMUNITY): Payer: 59

## 2010-09-08 DIAGNOSIS — N731 Chronic parametritis and pelvic cellulitis: Secondary | ICD-10-CM

## 2010-09-08 LAB — DIFFERENTIAL
Basophils Absolute: 0.1 10*3/uL (ref 0.0–0.1)
Eosinophils Absolute: 0.3 10*3/uL (ref 0.0–0.7)
Eosinophils Relative: 3 % (ref 0–5)
Lymphocytes Relative: 13 % (ref 12–46)
Lymphs Abs: 1.3 10*3/uL (ref 0.7–4.0)
Neutrophils Relative %: 75 % (ref 43–77)

## 2010-09-08 LAB — CBC
MCV: 86.2 fL (ref 78.0–100.0)
Platelets: 640 10*3/uL — ABNORMAL HIGH (ref 150–400)
RBC: 3.25 MIL/uL — ABNORMAL LOW (ref 3.87–5.11)
RDW: 15.2 % (ref 11.5–15.5)
WBC: 10 10*3/uL (ref 4.0–10.5)

## 2010-09-08 LAB — CROSSMATCH
ABO/RH(D): A POS
Antibody Screen: NEGATIVE
Unit division: 0

## 2010-09-09 LAB — CULTURE, ROUTINE-GENITAL: Culture: NORMAL

## 2010-09-09 LAB — CBC
MCH: 27.8 pg (ref 26.0–34.0)
MCHC: 31.7 g/dL (ref 30.0–36.0)
MCV: 87.7 fL (ref 78.0–100.0)
Platelets: 750 10*3/uL — ABNORMAL HIGH (ref 150–400)
RBC: 3.24 MIL/uL — ABNORMAL LOW (ref 3.87–5.11)
RDW: 15.5 % (ref 11.5–15.5)

## 2010-09-10 ENCOUNTER — Emergency Department (HOSPITAL_COMMUNITY)
Admission: EM | Admit: 2010-09-10 | Discharge: 2010-09-10 | Disposition: A | Discharge status: Home / Self Care | Payer: 59 | Attending: Emergency Medicine | Admitting: Emergency Medicine

## 2010-09-10 ENCOUNTER — Emergency Department (HOSPITAL_COMMUNITY): Payer: 59

## 2010-09-10 DIAGNOSIS — Z79899 Other long term (current) drug therapy: Secondary | ICD-10-CM | POA: Insufficient documentation

## 2010-09-10 DIAGNOSIS — R079 Chest pain, unspecified: Secondary | ICD-10-CM | POA: Insufficient documentation

## 2010-09-10 DIAGNOSIS — R0602 Shortness of breath: Secondary | ICD-10-CM | POA: Insufficient documentation

## 2010-09-10 DIAGNOSIS — K219 Gastro-esophageal reflux disease without esophagitis: Secondary | ICD-10-CM | POA: Insufficient documentation

## 2010-09-10 DIAGNOSIS — D649 Anemia, unspecified: Secondary | ICD-10-CM | POA: Insufficient documentation

## 2010-09-10 MED ORDER — IOHEXOL 350 MG/ML SOLN
100.0000 mL | Freq: Once | INTRAVENOUS | Status: AC | PRN
  Administered 2010-09-10: 100 mL via INTRAVENOUS

## 2010-09-13 NOTE — H&P (Signed)
NAMESEVANNAH, MADIA NO.:  [id number]  MEDICAL RECORD NO.:  [id number]           PATIENT TYPE:  LOCATION:                                 FACILITY:  PHYSICIAN:  Janine Limbo, M.D.DATE OF BIRTH:  September 26, 1965  DATE OF ADMISSION: DATE OF DISCHARGE:                             HISTORY & PHYSICAL   HISTORY OF PRESENT ILLNESS:  [name] is a 45 year old female, para 1-0-0-1, who presents to the [facility] for evaluation of left lower quadrant pain, vaginal leaking, vaginal bleeding, and vaginal odor.  The patient has been followed at the [facility] Obstetrics and Gynecology Division of [facility].  On Aug 26, 2010, the patient had a laparoscopy assisted vaginal hysterectomy, laparoscopic lysis of adhesions, laparoscopic left salpingo-oophorectomy, and cystoscopy.  The patient was found to have adenomyosis, fibroids, endometriosis, and pelvic adhesions.  At the end of the procedure, the bladder was carefully inspected and there was no evidence of damage to the bladder.  Both ureteral orifices were noted to be patent.  The patient also had insufflation of the rectum and there was no evidence of damage to the rectum.  The patient reports doing quite well at home.  She did have some vaginal spotting starting on Sep 03, 2010.  She reported though that this morning she began having a large amount of dark vaginal bleeding with a very foul odor.  The patient was evaluated in the office today and there was no evidence of infection.  However, she continued to have a large amount of leaking. She is very distraught by this.  Her postoperative hemoglobin was 6.7. The patient reports being very fatigued.  The patient does have a past history of anxiety.  The patient's past history is also significant for an endometrioma that was resected from her left ovary in 2008.  She was also noted to have pelvic adhesions at that  time.  OBSTETRICAL HISTORY:  The patient has had one term cesarean delivery. 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  PAST MEDICAL HISTORY:  The patient has a history of esophageal reflux and a history of anxiety.  CURRENT MEDICATIONS:  Include Zoloft, Nexium, Xanax, and iron.  DRUG ALLERGIES:  PENICILLIN CAUSES AN ANAPHYLACTIC REACTION.  SOCIAL HISTORY:  The patient drinks alcohol socially.  She denies cigarette use and other recreational drug uses.  REVIEW OF SYSTEMS:  See history of present illness.  FAMILY HISTORY:  The patient's mother had breast cancer.  PHYSICAL EXAM:  VITAL SIGNS:  Height is 5 feet 1 inch, weight is 185 pounds, pulse is 94-112, temperature is 99.9.  Blood pressure is 122/72. HEENT:  Within normal limits except for patient who appears to be in distress. CHEST:  Clear. HEART:  Regular rate and rhythm. ABDOMEN:  Nontender.  Her back is within normal limits. EXTREMITIES:  Grossly normal. NEUROLOGIC:  Grossly normal. PELVIC:  External genitalia is normal.  Vagina is normal.  The cuff is well-healed.  There is a moderate amount of dark red brown bloody drainage in the vagina.  Cervix is absent.  Uterus is absent.  Adnexa, there is fullness in the midline, but no discrete masses are appreciated.  ASSESSMENT: 1. Irregular vaginal bleeding. 2. Vaginal discharge and vaginal odor. 3. Status post laparoscopy assisted vaginal hysterectomy, laparoscopic     lysis of adhesions, laparoscopic left salpingo-oophorectomy, and     cystoscopy on Aug 26, 2010. 4. Anemia. 5. History of anxiety.  PLAN:  The patient will be observed at [facility] [place]. We will begin by doing a CT scan to try to localize a pelvic abscess or perhaps a pelvic seroma.  The patient will be given pain medications. We will determine whether or not surgery will be required to either drain some type of collection or put stitches in the apex of the  vagina. We will also check a CBC with a differential and a basic metabolic panel.     Janine Limbo, M.D.     AVS/MEDQ  D:  09/06/2010  T:  09/06/2010  Job:  [id number]  Electronically Signed by Kirkland Hun M.D. on 09/13/2010 11:07:18 PM

## 2010-09-13 NOTE — Op Note (Signed)
NAMEALABAMA, DOIG             ACCOUNT NO.:  [id number]  MEDICAL RECORD NO.:  [id number]           PATIENT TYPE:  I  LOCATION:  9304                          FACILITY:  [facility]  PHYSICIAN:  Janine Limbo, M.D.DATE OF BIRTH:  [birth year]  DATE OF PROCEDURE:  09/08/2010 DATE OF DISCHARGE:                              OPERATIVE REPORT   PREOPERATIVE DIAGNOSES: 1. Pelvic abscess. 2. Status post laparoscopy-assisted vaginal hysterectomy on Aug 26, 2010. 3. Anemia.  POSTOPERATIVE DIAGNOSES: 1. Pelvic abscess. 2. Status post laparoscopy-assisted vaginal hysterectomy on Aug 26, 2010. 3. Anemia.  PROCEDURES: 1. Examination under anesthesia. 2. Vaginal drainage of pelvic abscess.  SURGEON:  Janine Limbo, MD  FIRST ASSISTANT:  None.  ANESTHETIC:  General.  DISPOSITION:  [name] is a 45 year old female, para 1-0-0-1 who had a laparoscopy-assisted vaginal hysterectomy on Aug 26, 2010.  The patient has been followed at the [facility] and Gynecology Division of [facility] for [facility].  On Aug 26, 2010, the patient also had a laparoscopic lysis of adhesions and laparoscopic left salpingo-oophorectomy.  Operative findings included pelvic adhesions, endometriosis, and fibroids.  The patient also had cystoscopy and the patient was noted to have patent ureters bilaterally.  There was no evidence of damage to the bowel.  She had insufflation of the rectum and there was no evidence of damage to the rectum.  The patient did well postoperatively.  However on Sep 06, 2010, the patient complained of dark foul-smelling drainage from the vagina.  She will report not feeling well.  The patient was evaluated in the office and was noted to have drainage from the apex of the vagina.  The patient had a CT scan performed, which showed a 10-cm pelvic abscess at the apex of the vagina.  The patient's white blood cell count was 18,000.  Her hemoglobin  was as low as 6.7 (unchanged from her postoperative hemoglobin).  The patient was started on Primaxin antibiotics.  Her white blood cell count decreased to 10,000 over the next 2 days.  A repeat CT scan was performed and it showed that the pelvic abscess was now 7 cm in size.  We talked about our management options which included continued hospital therapy.  We also talked about home therapy with antibiotics by mouth.  I offered (and recommended) drainage of the remaining pelvic abscess.  The risk and benefits of all options were reviewed.  The patient, the patient's mother, and her husband considered the above options and elected to proceed with vaginal drainage of the abscess.  OPERATIVE FINDINGS:  Upon examination under anesthesia, the patient was noted to have a midline mass.  No adnexal masses were appreciated. Approximately 20 mL of fluid was evacuated from the cuff of the vagina where the mass was present.  PROCEDURE:  The patient was taken to the operating room where a general anesthetic was given.  The patient's lower abdomen, perineum, and vagina were prepped with multiple layers of Betadine.  The bladder was drained of urine.  The patient was then sterilely draped.  Examination under anesthesia was  performed.  A weighted speculum was placed in the vagina. The cuff and vagina was held with clamps.  The cuff of the vagina was injected with 10 mL of 0.5% Marcaine with epinephrine.  We were able to gently open the apex of the vagina using a Kelly clamp.  Clear fluid was then evacuated.  A catheter was placed in the opening of the vaginal apex and the apex of the vagina and the lower pelvis were irrigated with copious amounts of irrigation fluid.  We irrigated until the fluid return was clear.  Hemostasis was adequate.  The patient's exam was repeated and no mass was present.  All instruments were removed.  The patient was awakened from her anesthetic without difficulty  and transported to the recovery room in stable condition.  Sponge, needle, and instrument counts were correct.  The estimated blood loss for the procedure was 20 mL.  The patient tolerated her procedure well.  She was transported to the recovery room in stable condition.     Janine Limbo, M.D.     AVS/MEDQ  D:  09/08/2010  T:  09/09/2010  Job:  [id number]  Electronically Signed by Kirkland Hun M.D. on 09/13/2010 11:07:38 PM

## 2010-10-12 NOTE — Discharge Summary (Addendum)
Tracy, Dennis NO.:  [id number]  MEDICAL RECORD NO.:  [id number]  LOCATION:                                 FACILITY:  PHYSICIAN:  Janine Limbo, M.D.DATE OF BIRTH:  26-Sep-1965  DATE OF ADMISSION: DATE OF DISCHARGE:                              DISCHARGE SUMMARY   DISCHARGE DIAGNOSES:  Pelvic pain, endometriosis, status post laparoscopically assisted vaginal hysterectomy (Aug 26, 2010), vaginal bleeding, pelvic abscess, and anemia.  OPERATION:  On hospital day #2, the patient underwent examination under anesthesia followed by vaginal drainage of a pelvic abscess, tolerating procedure well.  The patient was found to have a palpable mass in the middle of her vaginal cuff from which 20 mL of fluid was drained.  HISTORY OF PRESENT ILLNESS:  [name] is a 45 year old female, para 1-0-0-1 who is status post laparoscopically assisted vaginal hysterectomy on Aug 26, 2010, who presented to [facility] for evaluation of left lower quadrant pain, malodorous vaginal discharge, and vaginal bleeding.  Please see the patient's dictated history and physical examination for details.  ADMISSION PHYSICAL EXAMINATION:  VITAL SIGNS:  Blood pressure 122/72, pulse was between 94 and 112 beats per minute, temperature 99.9 degrees Fahrenheit orally, weight 185 pounds, and height 5 feet and 1 inch tall. GENERAL:  Within normal limits. PELVIC:  External genitalia was normal.  Vagina was normal.  The patient's vaginal cuff appeared to be well healed.  There was a moderate amount of dark red-brown bloody drainage in the vaginal vault.  The patient's uterus and cervix were surgically absent.  In her adnexa, there was noted fullness at the midline, but no discrete masses were appreciated.  HOSPITAL COURSE:  On the date of admission, the patient underwent a CT scan of her abdomen and pelvis that revealed a 10-cm pelvic abscess at the apex of the  vaginal cuff.  The patient's admission white blood count was 18,000.  Her hemoglobin and hematocrit were 7.3/24.1 respectively and platelets 76,000.  The patient was placed on Primaxin antibiotics and in 48 hours her white blood cell count decreased to 10,000 and radiographically her abscess to 7 cm.  The patient subsequently had an infectious disease consultation that concurred with her management.  She decided to proceed with surgical drainage of her abscess.  The results of that procedure yielded 20 mL of fluid from the vaginal apex.  During the course of the patient's hospital stay, she was transfused 3 units of packed red blood cells as she had been anemic since her previous surgical procedure and was now symptomatic.  By hospital day #4, the patient clinically improved significantly, had a hemoglobin/hematocrit of 9.0/28.4 respectively, denied any significant pain, and was therefore deemed ready for discharge home.  DISCHARGE MEDICATIONS:  The patient was directed to her home medication reconciliation form.  FOLLOWUP:  The patient is to call Dr. Debria Garret office at [phone] to schedule a followup appointment with him in 2 weeks.  DISCHARGE INSTRUCTIONS:  She was advised to call for any temperature greater than or equal to 100.4 degrees Fahrenheit orally, that she may walk up steps.  She may shower.  She is not to lift for 2  weeks, no driving for 1 week, no intercourse for 4 weeks.  She should increase her activities slowly.  DIET:  A regular diet.  WOUND CARE:  Not applicable nor is there any pathology report.     Elmira J. Lowell Guitar, P.A.-C   ______________________________ Janine Limbo, M.D.    EJP/MEDQ  D:  10/05/2010  T:  10/06/2010  Job:  [id number]  Electronically Signed by Raylene Everts. on 10/10/2010 08:47:29 PM Electronically Signed by Kirkland Hun M.D. on 10/12/2010 07:52:25 AM

## 2010-10-13 ENCOUNTER — Other Ambulatory Visit: Payer: Self-pay | Admitting: Family Medicine

## 2010-10-13 DIAGNOSIS — Z1231 Encounter for screening mammogram for malignant neoplasm of breast: Secondary | ICD-10-CM

## 2010-10-22 ENCOUNTER — Ambulatory Visit: Payer: 59

## 2010-10-28 ENCOUNTER — Ambulatory Visit
Admission: RE | Admit: 2010-10-28 | Discharge: 2010-10-28 | Disposition: A | Discharge status: Home / Self Care | Payer: 59 | Source: Ambulatory Visit | Attending: Family Medicine | Admitting: Family Medicine

## 2010-10-28 DIAGNOSIS — Z1231 Encounter for screening mammogram for malignant neoplasm of breast: Secondary | ICD-10-CM

## 2011-01-27 LAB — BASIC METABOLIC PANEL
BUN: 13
Creatinine, Ser: 0.49
GFR calc non Af Amer: >60
Glucose, Bld: 97
Potassium: 3.6

## 2011-01-27 LAB — URINALYSIS, ROUTINE W REFLEX MICROSCOPIC
Bilirubin Urine: NEGATIVE
Hgb urine dipstick: NEGATIVE
Nitrite: NEGATIVE
Protein, ur: NEGATIVE
Specific Gravity, Urine: 1.01
Urobilinogen, UA: 0.2

## 2011-01-27 LAB — CBC
HCT: 34.2 — ABNORMAL LOW
Platelets: 602 — ABNORMAL HIGH
RDW: 13.8
WBC: 13.5 — ABNORMAL HIGH

## 2011-01-27 LAB — PREGNANCY, URINE: Preg Test, Ur: NEGATIVE

## 2011-02-02 LAB — DIFFERENTIAL
Basophils Relative: 0
Lymphocytes Relative: 20
Lymphs Abs: 2.9
Monocytes Absolute: 0.8 — ABNORMAL HIGH
Monocytes Relative: 5
Neutro Abs: 10.5 — ABNORMAL HIGH

## 2011-02-02 LAB — CBC
Hemoglobin: 13.5
MCHC: 34.2
RBC: 4.59
WBC: 14.5 — ABNORMAL HIGH

## 2011-08-26 ENCOUNTER — Ambulatory Visit
Admission: RE | Admit: 2011-08-26 | Discharge: 2011-08-26 | Disposition: A | Discharge status: Home / Self Care | Payer: 59 | Source: Ambulatory Visit | Attending: Family Medicine | Admitting: Family Medicine

## 2011-08-26 ENCOUNTER — Other Ambulatory Visit: Payer: Self-pay | Admitting: Family Medicine

## 2011-08-26 DIAGNOSIS — N6310 Unspecified lump in the right breast, unspecified quadrant: Secondary | ICD-10-CM

## 2012-02-08 ENCOUNTER — Other Ambulatory Visit (HOSPITAL_COMMUNITY): Payer: Self-pay | Admitting: Radiology

## 2012-02-08 DIAGNOSIS — R0600 Dyspnea, unspecified: Secondary | ICD-10-CM

## 2012-02-08 DIAGNOSIS — R0609 Other forms of dyspnea: Secondary | ICD-10-CM

## 2012-02-09 ENCOUNTER — Ambulatory Visit (HOSPITAL_COMMUNITY): Payer: 59 | Attending: Family Medicine | Admitting: Radiology

## 2012-02-09 DIAGNOSIS — R0609 Other forms of dyspnea: Secondary | ICD-10-CM | POA: Insufficient documentation

## 2012-02-09 DIAGNOSIS — R0600 Dyspnea, unspecified: Secondary | ICD-10-CM

## 2012-02-09 DIAGNOSIS — R0602 Shortness of breath: Secondary | ICD-10-CM

## 2012-02-09 DIAGNOSIS — R0989 Other specified symptoms and signs involving the circulatory and respiratory systems: Secondary | ICD-10-CM | POA: Insufficient documentation

## 2012-02-09 NOTE — Progress Notes (Signed)
Echocardiogram performed.  

## 2012-03-20 ENCOUNTER — Institutional Professional Consult (permissible substitution): Payer: 59 | Admitting: Cardiovascular Disease

## 2012-05-29 ENCOUNTER — Institutional Professional Consult (permissible substitution): Payer: 59 | Admitting: Cardiovascular Disease

## 2012-05-31 ENCOUNTER — Institutional Professional Consult (permissible substitution): Payer: 59 | Admitting: Cardiovascular Disease

## 2012-07-06 IMAGING — CT CT ABD-PELV W/ CM
1 of 3 series · 13 of 32 positions shown, 19 images · IV contrast (OMNIPAQUE)
Comparison: None.

CLINICAL DATA: Postop from laparoscopic assisted vaginal
hysterectomy and left oophrectomy for endometriosis.  Left pelvic
pain, fever, and nausea.

CT ABDOMEN AND PELVIS WITH CONTRAST
TECHNIQUE: Multidetector CT imaging of the abdomen and pelvis was
performed following the standard protocol during bolus
administration of intravenous contrast.
Contrast: 100 ml Hmnipaque-SWW and oral contrast

[Series 2: routine abdomen/pelvis with · axial · 0.80mm/px · z∈[-429,-39]mm · 13 of 92 slices shown, 19 images]
[im 7/92  soft-tissue]
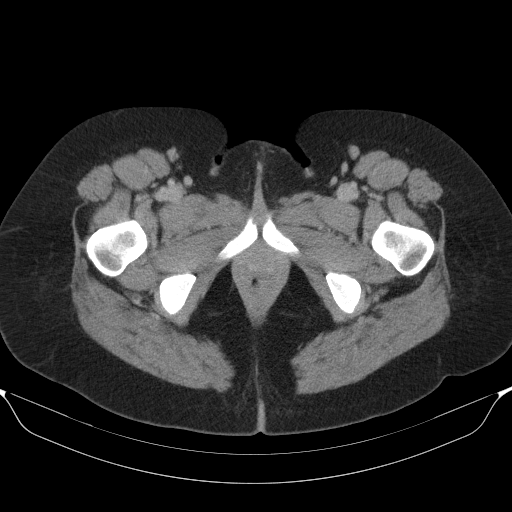
[im 7/92  bone]
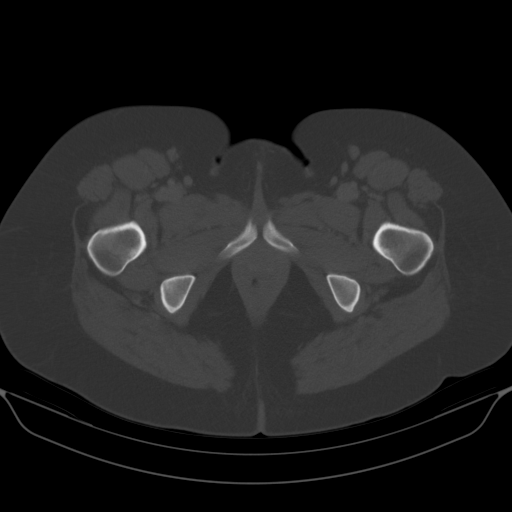
[im 13/92  soft-tissue]
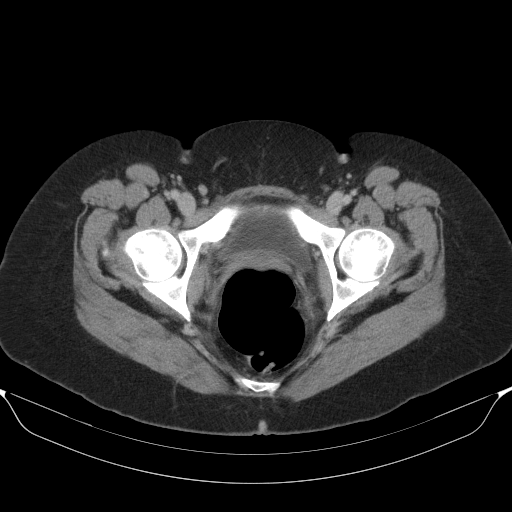
[im 19/92  soft-tissue]
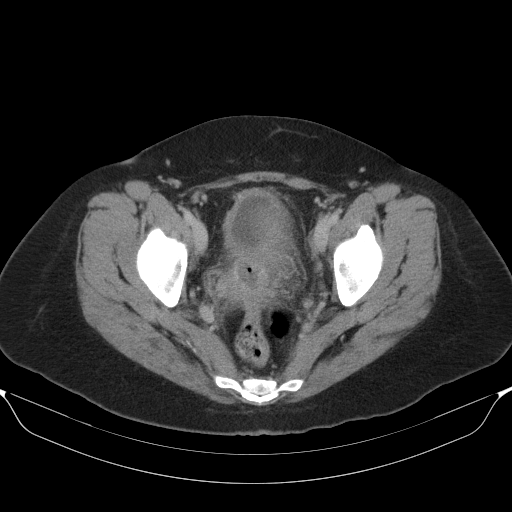
[im 25/92  soft-tissue]
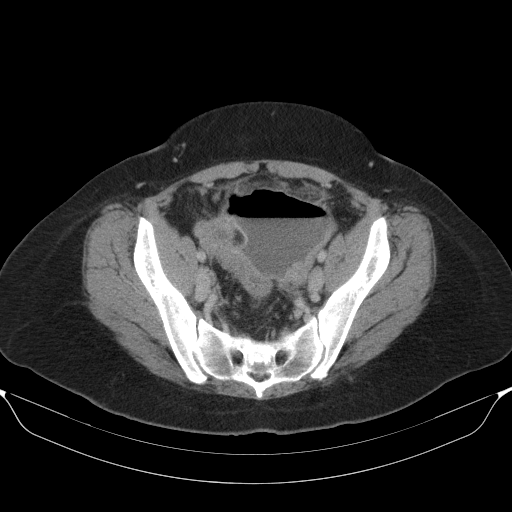
[im 31/92  soft-tissue]
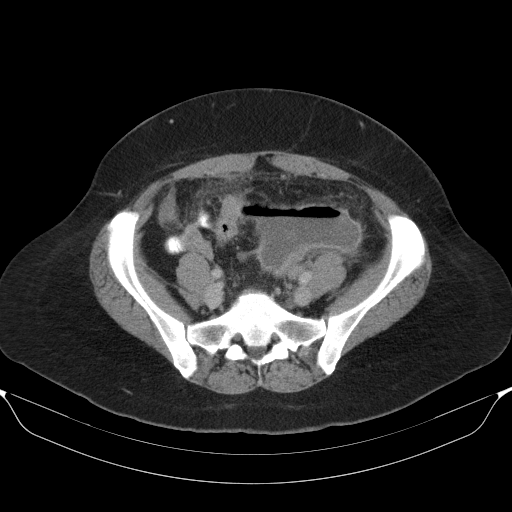
[im 37/92  soft-tissue]
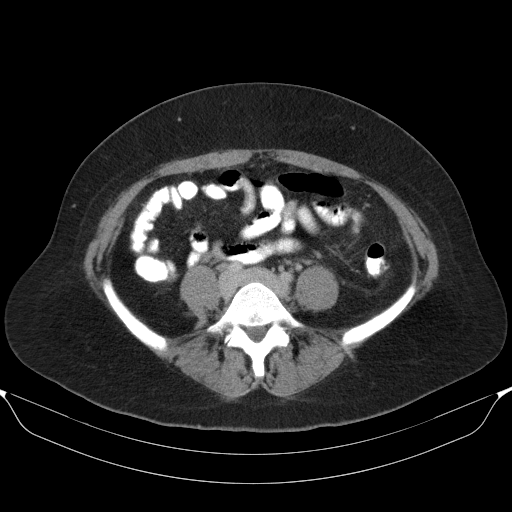
[im 49/92  soft-tissue]
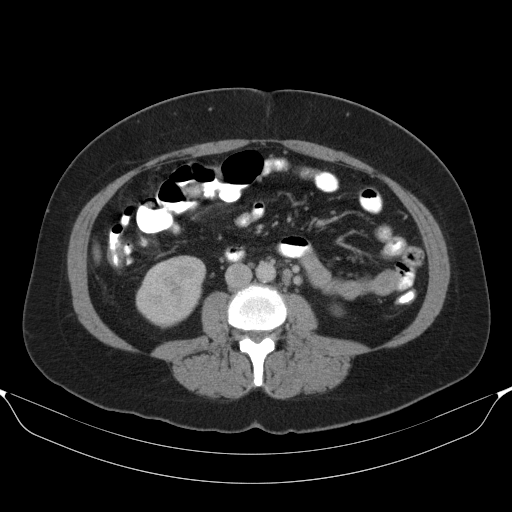
[im 55/92  soft-tissue]
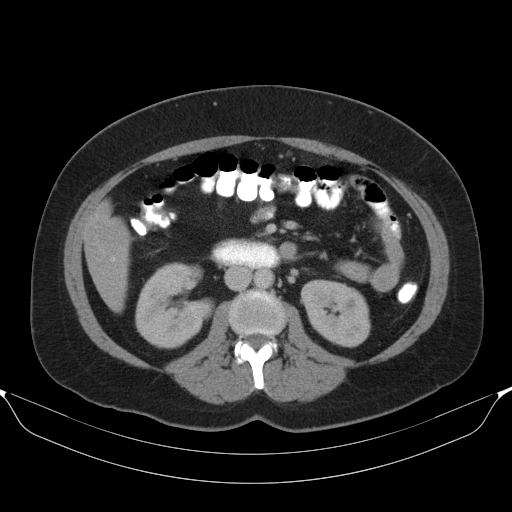
[im 61/92  soft-tissue]
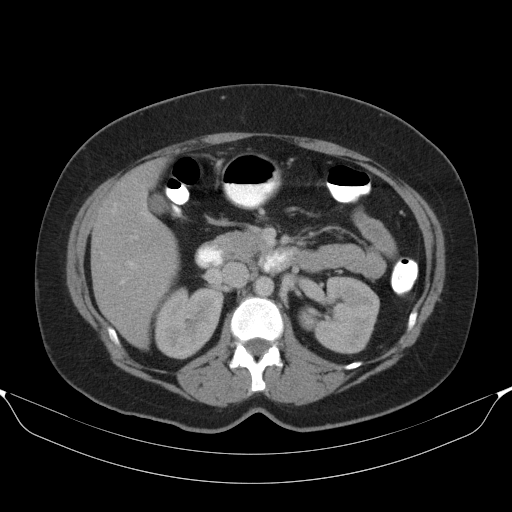
[im 61/92  bone]
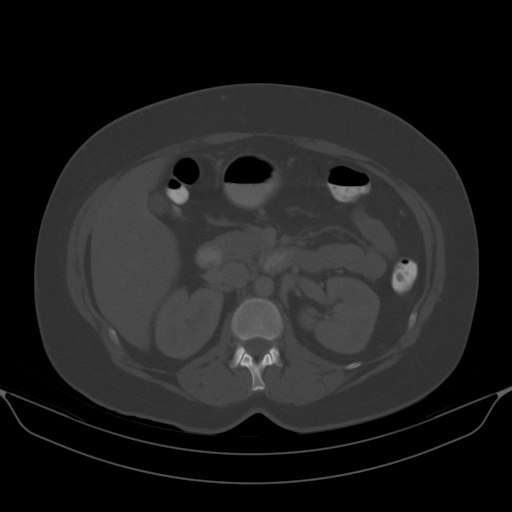
[im 67/92  soft-tissue]
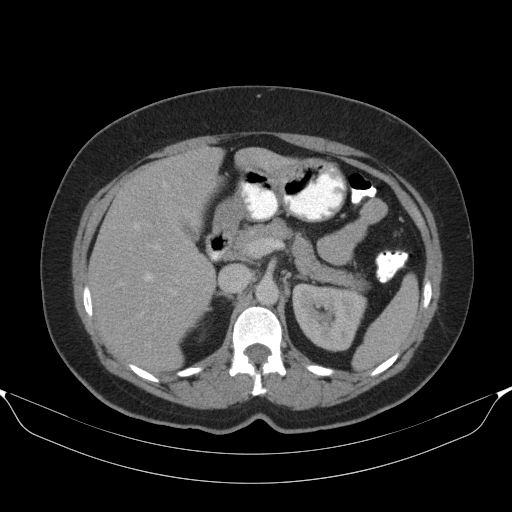
[im 67/92  lung]
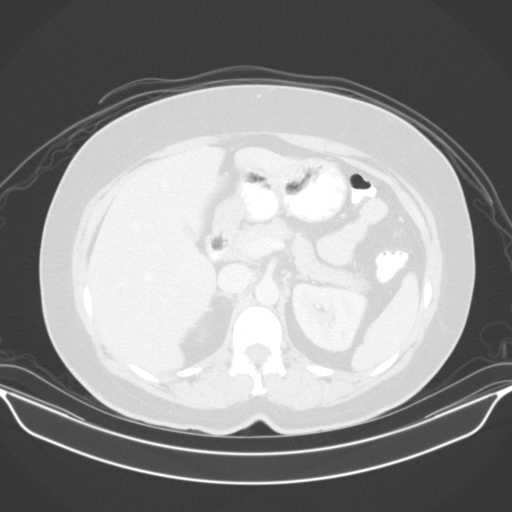
[im 73/92  soft-tissue]
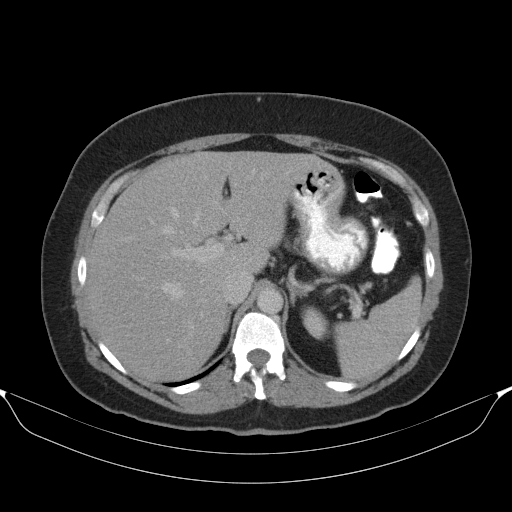
[im 73/92  lung]
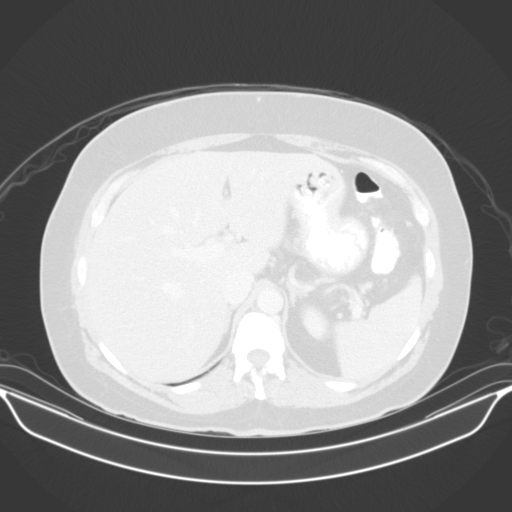
[im 79/92  soft-tissue]
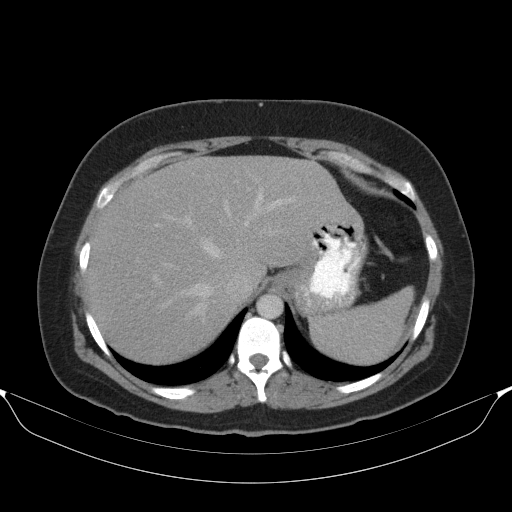
[im 79/92  lung]
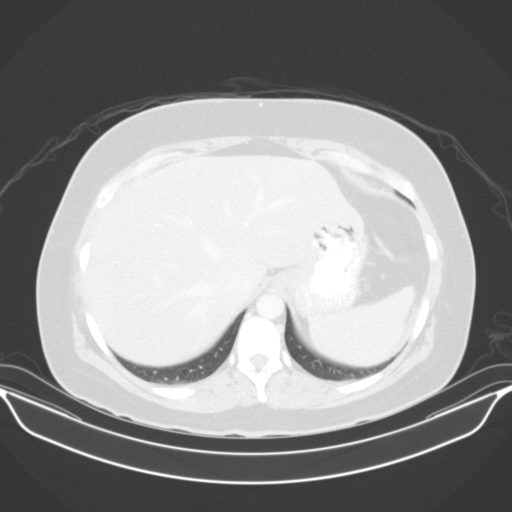
[im 85/92  soft-tissue]
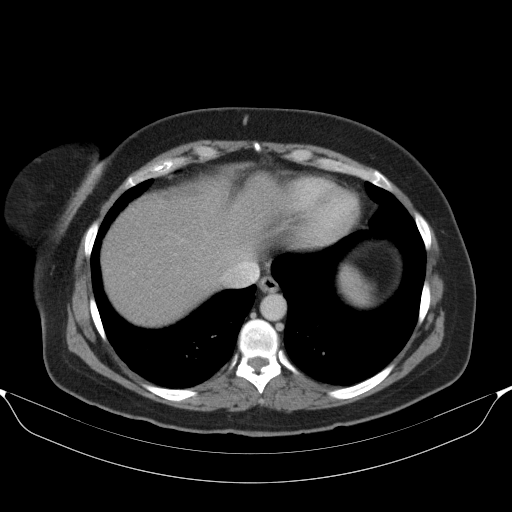
[im 85/92  lung]
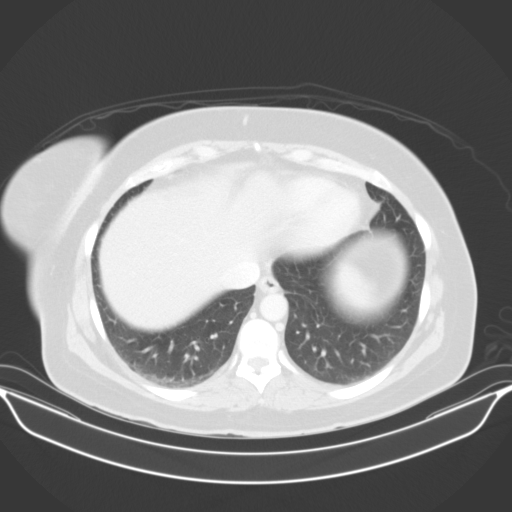

[13 of 32 positions shown; findings below may reference images not displayed]

FINDINGS: Postop changes are seen from hysterectomy.  There is a
large fluid and gas collection seen in the central pelvis which
shows mild rim enhancement and is consistent with an abscess.  This
measures 7 x 10 cm. This is contiguous with the vaginal cuff which
contains several small air bubbles, and and vaginal fistula is
suspected.  There is mild wall thickening of the proximal sigmoid
colon adjacent to the abscess, however there is no evidence of
bowel obstruction.  No evidence of intraperitoneal air.

Mild hepatic steatosis is seen.  No liver masses are identified.
Tiny renal cysts are noted.  No evidence of hydronephrosis.  No
evidence of ureteral or bladder injury on delayed excretory phase
images.

The spleen, pancreas, adrenal glands, and gallbladder are normal
appearance.  No soft tissue masses are identified.
IMPRESSION: 1.  7 x 10 cm abscess in the central pelvis, with probable fistula
communicating with the vaginal cuff.
2.  Mild wall thickening of adjacent sigmoid colon, which is likely
reactive.  No evidence of bowel obstruction.
3.  No evidence of urinary tract injury.
4.  Mild hepatic steatosis.

## 2012-07-16 ENCOUNTER — Institutional Professional Consult (permissible substitution): Payer: 59 | Admitting: Cardiovascular Disease

## 2012-07-31 ENCOUNTER — Telehealth: Payer: Self-pay | Admitting: *Deleted

## 2012-07-31 MED ORDER — SULFAMETHOXAZOLE-TRIMETHOPRIM 800-160 MG PO TABS: 1.0000 | ORAL_TABLET | Freq: Two times a day (BID) | ORAL | Status: DC

## 2012-07-31 NOTE — Telephone Encounter (Signed)
NEEDS ANTIBIOTIC SENT IN TO [facility] FOR SKIN INFECTION- SMALL ABCESS IN AXILLARY AREA- SHE HAS HAD BEFORE AND YOU'VE TREATED. ALLERGIC TO PCN

## 2012-07-31 NOTE — Telephone Encounter (Signed)
Sent in bactrim RX

## 2012-07-31 NOTE — Telephone Encounter (Signed)
RX SENT IN TO PHARMACY

## 2012-08-17 ENCOUNTER — Other Ambulatory Visit: Payer: Self-pay

## 2012-08-17 MED ORDER — PANTOPRAZOLE SODIUM 40 MG PO TBEC: 40.0000 mg | DELAYED_RELEASE_TABLET | Freq: Every day | ORAL | Status: DC

## 2012-08-17 MED ORDER — SERTRALINE HCL 100 MG PO TABS: 100.0000 mg | ORAL_TABLET | Freq: Every day | ORAL | Status: DC

## 2012-08-27 ENCOUNTER — Encounter: Payer: Self-pay | Admitting: Cardiovascular Disease

## 2012-08-27 ENCOUNTER — Ambulatory Visit (INDEPENDENT_AMBULATORY_CARE_PROVIDER_SITE_OTHER): Payer: 59 | Admitting: Cardiovascular Disease

## 2012-08-27 VITALS — BP 140/96 | HR 82 | Ht 62.0 in | Wt 204.0 lb

## 2012-08-27 DIAGNOSIS — I10 Essential (primary) hypertension: Secondary | ICD-10-CM

## 2012-08-27 MED ORDER — HYDROCHLOROTHIAZIDE 25 MG PO TABS: 25.0000 mg | ORAL_TABLET | Freq: Every day | ORAL | Status: DC

## 2012-08-27 NOTE — Patient Instructions (Addendum)
Your physician has recommended you make the following change in your medication: START HCTZ 25mg  take one by mouth daily  Your physician recommends that you return for lab work in: 2 WEEKS (B[facility] at [facility])  Your physician wants you to follow-up in: 6 MONTHS with Dr Excell Seltzer.  You will receive a reminder letter in the mail two months in advance. If you don't receive a letter, please call our office to schedule the follow-up appointment.

## 2012-08-27 NOTE — Progress Notes (Signed)
HPI:  47 year old woman presenting for evaluation of shortness of breath with exertion. She works as a Engineer, civil (consulting) at [employer]. She has noted progressive exertional dyspnea over the past one to 2 years. She denies chest pain or pressure. She denies orthopnea or PND. She does admit to leg swelling on occasion. She has no documented history of cardiac disease. The patient underwent an exercise treadmill study in 2013 demonstrating an exaggerated blood pressure response to exercise. She was started on a beta blocker. She could not tolerate the 5 mg dose of bysystolic and this was decreased to 2.5 mg daily. She continues to have elevated blood pressure. An echocardiogram was done to evaluate for diastolic dysfunction. This demonstrated normal left ventricular systolic function, with Doppler evidence of a relative increased contribution of atrial contraction (diastolic dysfunction).  The patient has gained 25 pounds over the last few years. She exercises less. She's had increased work stress. She's a long-time smoker and has not been able to quit.  Outpatient Encounter Prescriptions as of 08/27/2012  Medication Sig Dispense Refill  . nebivolol (BYSTOLIC) 5 MG tablet Take 2.5 mg by mouth daily.      . pantoprazole (PROTONIX) 40 MG tablet Take 1 tablet (40 mg total) by mouth daily.  90 tablet  1  . sertraline (ZOLOFT) 100 MG tablet Take 1 tablet (100 mg total) by mouth daily.  90 tablet  0  . sulfamethoxazole-trimethoprim (BACTRIM DS,SEPTRA DS) 800-160 MG per tablet Take 1 tablet by mouth 2 (two) times daily.  20 tablet  0  . VITAMIN D, CHOLECALCIFEROL, PO Take 50,000 mg by mouth daily.      . [DISCONTINUED] esomeprazole (NEXIUM) 40 MG capsule Take 40 mg by mouth daily before breakfast.       No facility-administered encounter medications on file as of 08/27/2012.    Penicillins  Past Medical History  Diagnosis Date  . Esophageal reflux   . Anxiety     Past Surgical History    Procedure Laterality Date  . Laparoscopic vaginal hysterectomy N/A 05 10 2012    History   Social History  . Marital Status: Married    Spouse Name: N/A    Number of Children: N/A  . Years of Education: N/A   Occupational History  . Not on file.   Social History Main Topics  . Smoking status: Current Every Day Smoker  . Smokeless tobacco: Not on file  . Alcohol Use: 1.0 oz/week    2 drink(s) per week  . Drug Use: No  . Sexually Active: Not on file   Other Topics Concern  . Not on file   Social History Narrative  . No narrative on file    Family History  Problem Relation Age of Onset  . Breast cancer Mother     ROS:  General: no fevers/chills/night sweats, positive for fatigue Eyes: no blurry vision, diplopia, or amaurosis ENT: no sore throat or hearing loss Resp: no cough, wheezing, or hemoptysis CV: no palpitations GI: no abdominal pain, nausea, vomiting, diarrhea, or constipation GU: no dysuria, frequency, or hematuria Skin: no rash Neuro: no headache, numbness, tingling, or weakness of extremities Musculoskeletal: no joint pain or swelling Heme: no bleeding, DVT, or easy bruising Endo: no polydipsia or polyuria  BP 140/96  Pulse 82  Ht 5\' 2"  (1.575 m)  Wt 92.534 kg (204 lb)  BMI 37.3 kg/m2  SpO2 99%  PHYSICAL EXAM: Pt is alert and oriented, WD, WN, pleasant overweight woman in  no distress. HEENT: normal Neck: JVP normal. Carotid upstrokes normal without bruits. No thyromegaly. Lungs: equal expansion, clear bilaterally CV: Apex is discrete and nondisplaced, RRR without murmur or gallop Abd: soft, NT, +BS, no bruit, no hepatosplenomegaly Back: no CVA tenderness Ext: no C/C/E        Femoral pulses 2+= without bruits        DP/PT pulses intact and = Skin: warm and dry without rash Neuro: CNII-XII intact             Strength intact = bilaterally  EKG:  Sinus rhythm, within normal limits.  2D Echo: 02/09/2012: Left ventricle: The cavity size  was normal. Wall thickness was normal. Systolic function was normal. The estimated ejection fraction was in the range of 60% to 65%. There was an increased relative contribution of atrial contraction to ventricular filling.  ------------------------------------------------------------ Aortic valve: Doppler: There was no stenosis. Mild regurgitation.  ------------------------------------------------------------ Aorta: Ascending aorta: The ascending aorta was mildly dilated.  ------------------------------------------------------------ Mitral valve: Doppler: No significant regurgitation.  ------------------------------------------------------------ Left atrium: The atrium was mildly dilated.  ------------------------------------------------------------ Right ventricle: The cavity size was normal. Wall thickness was normal. Systolic function was normal.  ------------------------------------------------------------ Pulmonic valve: Doppler: No significant regurgitation.  ------------------------------------------------------------ Tricuspid valve: Doppler: Trivial regurgitation.  ------------------------------------------------------------ Right atrium: The atrium was normal in size.  ------------------------------------------------------------  2D measurements Normal Doppler measurements Normal Left ventricle Left ventricle LVID ED, 42.6 mm 43-52 Ea, lat 6.15 cm/s ------ chord, ann, tiss PLAX DP LVID ES, 28.8 mm 23-38 E/Ea, lat 10.75 ------ chord, ann, tiss PLAX DP FS, chord, 32 % >29 Ea, med 4.51 cm/s ------ PLAX ann, tiss LVPW, ED 11.2 mm ------ DP IVS/LVPW 0.97 <1.3 E/Ea, med 14.66 ------ ratio, ED ann, tiss Ventricular septum DP IVS, ED 10.9 mm ------ LVOT LVOT Peak vel, 114 cm/s ------ Diam, S 22 mm ------ S Area 3.8 cm^2 ------ VTI, S 19.6 cm ------ Diam 22 mm ------ Peak 5 mm Hg ------ Aorta gradient, Root diam, 34 mm ------ S ED Stroke vol 74.5 ml  ------ Left atrium Stroke 39.8 ml/m^ ------ AP dim 41 mm ------ index 2 AP dim 2.19 cm/m^2 <2.2 Aortic valve index Regurg PHT 402 ms ------ Mitral valve Peak E vel 66.1 cm/s ------ Peak A vel 96.3 cm/s ------ Decelerati 195 ms 150-23 on time 0 Peak E/A 0.7 ------ ratio Right ventricle Sa vel, 14.8 cm/s ------ lat ann, tiss DP  ASSESSMENT AND PLAN: 1. Exertional dyspnea. I suspect this is related to a combination of uncontrolled hypertension, obesity, and diastolic dysfunction. We had a lengthy discussion today regarding the importance of regular exercise. We also discussed strategies that weight loss. She would like to try a carbohydrate restrictive diet and I think this is appropriate. I do not think she requires further cardiac testing at this point, as the likelihood of obstructive coronary disease as an etiology of her symptoms is low. I would like to see her back in 6 months for followup.  2. Essential hypertension with suboptimal control. Weight loss is going to be a key in controlling her blood pressure. I recommended that she start on hydrochlorothiazide 25 mg in addition to her current regimen. A metabolic panel should be checked in a few weeks.  3. Tobacco. Discussed need for tobacco cessation and counseling was done.  Tonny Bollman 08/27/2012 11:52 PM

## 2012-09-18 ENCOUNTER — Other Ambulatory Visit (INDEPENDENT_AMBULATORY_CARE_PROVIDER_SITE_OTHER): Payer: 59

## 2012-09-18 DIAGNOSIS — I10 Essential (primary) hypertension: Secondary | ICD-10-CM

## 2012-09-18 NOTE — Progress Notes (Signed)
Patient came in for order from Dr.Cooper

## 2012-09-19 LAB — BASIC METABOLIC PANEL WITH GFR
CO2: 26 mEq/L (ref 19–32)
Chloride: 101 mEq/L (ref 96–112)
Glucose, Bld: 122 mg/dL — ABNORMAL HIGH (ref 70–99)
Potassium: 4 mEq/L (ref 3.5–5.3)
Sodium: 134 mEq/L — ABNORMAL LOW (ref 135–145)

## 2012-09-26 ENCOUNTER — Other Ambulatory Visit: Payer: Self-pay

## 2012-09-26 DIAGNOSIS — E871 Hypo-osmolality and hyponatremia: Secondary | ICD-10-CM

## 2012-11-07 ENCOUNTER — Ambulatory Visit (INDEPENDENT_AMBULATORY_CARE_PROVIDER_SITE_OTHER): Payer: 59 | Admitting: General Practice

## 2012-11-07 VITALS — BP 128/80 | HR 80 | Temp 97.0°F | Resp 18 | Ht 62.0 in

## 2012-11-07 DIAGNOSIS — T7840XA Allergy, unspecified, initial encounter: Secondary | ICD-10-CM

## 2012-11-07 DIAGNOSIS — L509 Urticaria, unspecified: Secondary | ICD-10-CM

## 2012-11-07 MED ORDER — METHYLPREDNISOLONE ACETATE 80 MG/ML IJ SUSP
80.0000 mg | Freq: Once | INTRAMUSCULAR | Status: AC
  Administered 2012-11-07: 80 mg via INTRAMUSCULAR

## 2012-11-07 MED ORDER — DIPHENHYDRAMINE HCL 25 MG PO CAPS
50.0000 mg | ORAL_CAPSULE | Freq: Four times a day (QID) | ORAL | Status: DC | PRN
  Administered 2012-11-07: 50 mg via ORAL

## 2012-11-07 MED ORDER — PREDNISONE (PAK) 10 MG PO TABS: ORAL_TABLET | ORAL | Status: DC

## 2012-11-07 NOTE — Progress Notes (Signed)
  Subjective:    Patient ID: [name], female    DOB: 04-Apr-1966, 47 y.o.   MRN: [id number]  HPI Patient presents today with complaints of hives and itching. She reports onset was 4am and has been taking Bactrim for past five days. She also reports having a new pet (dog) that she has had for 10 days. She denies using new soaps, detergents, frangrances, or body washes.     Review of Systems  Constitutional: Negative for fever and chills.  Respiratory: Negative for chest tightness and shortness of breath.   Cardiovascular: Negative for chest pain and palpitations.  Gastrointestinal: Negative for abdominal pain.  Genitourinary: Negative for difficulty urinating.  Skin: Positive for rash.       Hives to mid abdomen, bilateral arms, and legs  Neurological: Negative for dizziness, weakness and headaches.       Objective:   Physical Exam  Constitutional: She is oriented to person, place, and time. She appears well-developed and well-nourished.  HENT:  Head: Normocephalic and atraumatic.  Right Ear: External ear normal.  Left Ear: External ear normal.  Mouth/Throat: Oropharynx is clear and moist.  Eyes: EOM are normal.  Neck: Normal range of motion. Neck supple. No thyromegaly present.  Cardiovascular: Normal rate, regular rhythm and normal heart sounds.   Pulmonary/Chest: Effort normal and breath sounds normal. No respiratory distress. She exhibits no tenderness.  Lymphadenopathy:    She has no cervical adenopathy.  Neurological: She is alert and oriented to person, place, and time.  Skin: Skin is warm and dry. Rash noted. Rash is macular and urticarial. There is erythema.  Hives and noted to mid abdomen, bilateral arm, and upper legs  Psychiatric: She has a normal mood and affect.          Assessment & Plan:  1. Urticaria and 2. Allergic reaction, initial encounter - methylPREDNISolone acetate (DEPO-MEDROL) injection 80 mg; Inject 1 mL (80 mg total) into the muscle  once. - diphenhydrAMINE (BENADRYL) capsule 50 mg; Take 2 capsules (50 mg total) by mouth every 6 (six) hours as needed (urticaria). -Prednisone dose pack (6 day), take as prescribed -cool compresses to soothe itching -May continue use of benadryl as directed -RTO if symptoms worsen or seek emergency medical treatment -Patient verbalized understanding -Coralie Keens, FNP-C

## 2012-11-07 NOTE — Patient Instructions (Addendum)
Hives Hives are itchy, red, swollen areas of the skin. They can vary in size and location on your body. Hives can come and go for hours or several days (acute hives) or for several weeks (chronic hives). Hives do not spread from person to person (noncontagious). They may get worse with scratching, exercise, and emotional stress. CAUSES   Allergic reaction to food, additives, or drugs.  Infections, including the common cold.  Illness, such as vasculitis, lupus, or thyroid disease.  Exposure to sunlight, heat, or cold.  Exercise.  Stress.  Contact with chemicals. SYMPTOMS   Red or white swollen patches on the skin. The patches may change size, shape, and location quickly and repeatedly.  Itching.  Swelling of the hands, feet, and face. This may occur if hives develop deeper in the skin. DIAGNOSIS  Your caregiver can usually tell what is wrong by performing a physical exam. Skin or blood tests may also be done to determine the cause of your hives. In some cases, the cause cannot be determined. TREATMENT  Mild cases usually get better with medicines such as antihistamines. Severe cases may require an emergency epinephrine injection. If the cause of your hives is known, treatment includes avoiding that trigger.  HOME CARE INSTRUCTIONS   Avoid causes that trigger your hives.  Take antihistamines as directed by your caregiver to reduce the severity of your hives. Non-sedating or low-sedating antihistamines are usually recommended. Do not drive while taking an antihistamine.  Take any other medicines prescribed for itching as directed by your caregiver.  Wear loose-fitting clothing.  Keep all follow-up appointments as directed by your caregiver. SEEK MEDICAL CARE IF:   You have persistent or severe itching that is not relieved with medicine.  You have painful or swollen joints. SEEK IMMEDIATE MEDICAL CARE IF:   You have a fever.  Your tongue or lips are swollen.  You have  trouble breathing or swallowing.  You feel tightness in the throat or chest.  You have abdominal pain. These problems may be the first sign of a life-threatening allergic reaction. Call your local emergency services (911 in [place] MAKE SURE YOU:   Understand these instructions.  Will watch your condition.  Will get help right away if you are not doing well or get worse. Document Released: 04/04/2005 Document Revised: 10/04/2011 Document Reviewed: 06/28/2011 [facility] Patient Information 2014 [place], [place].   Methylprednisolone Suspension for Injection What is this medicine? METHYLPREDNISOLONE (meth ill pred NISS oh lone) is a corticosteroid. It is commonly used to treat inflammation of the skin, joints, lungs, and other organs. Common conditions treated include asthma, allergies, and arthritis. It is also used for other conditions, such as blood disorders and diseases of the adrenal glands. This medicine may be used for other purposes; ask your health care provider or pharmacist if you have questions. What should I tell my health care provider before I take this medicine? They need to know if you have any of these conditions: -cataracts or glaucoma -Cushings -heart disease -high blood pressure -infection including tuberculosis -low calcium or potassium levels in the blood -recent surgery -seizures -stomach or intestinal disease, including colitis -threadworms -thyroid problems -an unusual or allergic reaction to methylprednisolone, corticosteroids, benzyl alcohol, other medicines, foods, dyes, or preservatives -pregnant or trying to get pregnant -breast-feeding How should I use this medicine? This medicine is for injection into a muscle, joint, or other tissue. It is given by a health care professional in a hospital or clinic setting. Talk to your pediatrician  regarding the use of this medicine in children. While this drug may be prescribed for selected conditions, precautions  do apply. Overdosage: If you think you have taken too much of this medicine contact a poison control center or emergency room at once. NOTE: This medicine is only for you. Do not share this medicine with others. What if I miss a dose? This does not apply. What may interact with this medicine? Do not take this medicine with any of the following medications: -mifepristone -radiopaque contrast agents This medicine may also interact with the following medications: -aspirin and aspirin-like medicines -cyclosporin -ketoconazole -phenobarbital -phenytoin -rifampin -tacrolimus -troleandomycin -vaccines -warfarin This list may not describe all possible interactions. Give your health care provider a list of all the medicines, herbs, non-prescription drugs, or dietary supplements you use. Also tell them if you smoke, drink alcohol, or use illegal drugs. Some items may interact with your medicine. What should I watch for while using this medicine? Visit your doctor or health care professional for regular checks on your progress. If you are taking this medicine for a long time, carry an identification card with your name and address, the type and dose of your medicine, and your doctor's name and address. The medicine may increase your risk of getting an infection. Stay away from people who are sick. Tell your doctor or health care professional if you are around anyone with measles or chickenpox. You may need to avoid some vaccines. Talk to your health care provider for more information. If you are going to have surgery, tell your doctor or health care professional that you have taken this medicine within the last twelve months. Ask your doctor or health care professional about your diet. You may need to lower the amount of salt you eat. The medicine can increase your blood sugar. If you are a diabetic check with your doctor if you need help adjusting the dose of your diabetic medicine. What side  effects may I notice from receiving this medicine? Side effects that you should report to your doctor or health care professional as soon as possible: -allergic reactions like skin rash, itching or hives, swelling of the face, lips, or tongue -bloody or tarry stools -changes in vision -eye pain or bulging eyes -fever, sore throat, sneezing, cough, or other signs of infection, wounds that will not heal -increased thirst -irregular heartbeat -muscle cramps -pain in hips, back, ribs, arms, shoulders, or legs -swelling of the ankles, feet, hands -trouble passing urine or change in the amount of urine -unusual bleeding or bruising -unusually weak or tired -weight gain or weight loss Side effects that usually do not require medical attention (report to your doctor or health care professional if they continue or are bothersome): -changes in emotions or moods -constipation or diarrhea -headache -irritation at site where injected -nausea, vomiting -skin problems, acne, thin and shiny skin -trouble sleeping -unusual hair growth on the face or body This list may not describe all possible side effects. Call your doctor for medical advice about side effects. You may report side effects to FDA at 1-800-FDA-1088. Where should I keep my medicine? This drug is given in a hospital or clinic and will not be stored at home. NOTE: This sheet is a summary. It may not cover all possible information. If you have questions about this medicine, talk to your doctor, pharmacist, or health care provider.  2013, [employer]. (10/24/2007 2:36:31 PM)

## 2012-11-08 ENCOUNTER — Other Ambulatory Visit: Payer: Self-pay | Admitting: General Practice

## 2012-11-08 DIAGNOSIS — T7840XD Allergy, unspecified, subsequent encounter: Secondary | ICD-10-CM

## 2012-11-08 MED ORDER — PREDNISONE 50 MG PO TABS: 50.0000 mg | ORAL_TABLET | Freq: Every day | ORAL | Status: AC

## 2012-12-03 ENCOUNTER — Telehealth: Payer: Self-pay | Admitting: *Deleted

## 2012-12-03 MED ORDER — VALACYCLOVIR HCL 1 G PO TABS: 1000.0000 mg | ORAL_TABLET | Freq: Three times a day (TID) | ORAL | Status: DC

## 2012-12-03 NOTE — Telephone Encounter (Signed)
Called in.

## 2012-12-03 NOTE — Telephone Encounter (Signed)
[place]mm [place] needs valtrex for fever blisters (from the fever she has been running)-jhb

## 2012-12-11 ENCOUNTER — Ambulatory Visit: Payer: 59 | Admitting: Nurse Practitioner

## 2012-12-11 ENCOUNTER — Encounter: Payer: Self-pay | Admitting: Family Medicine

## 2012-12-11 ENCOUNTER — Ambulatory Visit (INDEPENDENT_AMBULATORY_CARE_PROVIDER_SITE_OTHER): Payer: 59 | Admitting: Family Medicine

## 2012-12-11 VITALS — BP 107/71 | HR 76 | Temp 97.0°F | Wt 196.2 lb

## 2012-12-11 DIAGNOSIS — R748 Abnormal levels of other serum enzymes: Secondary | ICD-10-CM

## 2012-12-11 DIAGNOSIS — R5381 Other malaise: Secondary | ICD-10-CM

## 2012-12-11 DIAGNOSIS — R7401 Elevation of levels of liver transaminase levels: Secondary | ICD-10-CM

## 2012-12-11 DIAGNOSIS — R531 Weakness: Secondary | ICD-10-CM

## 2012-12-11 DIAGNOSIS — R7402 Elevation of levels of lactic acid dehydrogenase (LDH): Secondary | ICD-10-CM

## 2012-12-11 DIAGNOSIS — R197 Diarrhea, unspecified: Secondary | ICD-10-CM

## 2012-12-11 LAB — POCT CBC
Granulocyte percent: 64.3 %G (ref 37–80)
HCT, POC: 40.5 % (ref 37.7–47.9)
Hemoglobin: 13.8 g/dL (ref 12.2–16.2)
Lymph, poc: 2.1 (ref 0.6–3.4)
MCH, POC: 30.3 pg (ref 27–31.2)
MCHC: 34.1 g/dL (ref 31.8–35.4)
MCV: 88.9 fL (ref 80–97)
MPV: 6.7 fL (ref 0–99.8)
POC Granulocyte: 4.8 (ref 2–6.9)
POC LYMPH PERCENT: 28.4 %L (ref 10–50)
Platelet Count, POC: 313 10*3/uL (ref 142–424)
RBC: 4.6 M/uL (ref 4.04–5.48)
RDW, POC: 13 %
WBC: 7.4 10*3/uL (ref 4.6–10.2)

## 2012-12-11 NOTE — Progress Notes (Signed)
Patient ID: DECKLYN HORNIK, female   DOB: 27-Aug-1965, 47 y.o.   MRN: [id number] SUBJECTIVE: CC: Chief Complaint  Patient presents with  . Acute Visit    stataes was sick last week states looked dehydration called mmmm and she ordered fluids IV stataes bp 170/92    HPI Had an episode of diarrhea and being sick for a week. Went to [employer] and was sick and weak from the diarrhea for 9 days. Getting better now. Had 5 diarrhea stools daily. Had to get IV rehydration here one Saturday. Event started after eating a biscuit sandwich.  Past Medical History  Diagnosis Date  . Esophageal reflux   . Anxiety    Past Surgical History  Procedure Laterality Date  . Laparoscopic vaginal hysterectomy N/A 05 10 2012   History   Social History  . Marital Status: Married    Spouse Name: N/A    Number of Children: N/A  . Years of Education: N/A   Occupational History  . Not on file.   Social History Main Topics  . Smoking status: Current Every Day Smoker  . Smokeless tobacco: Never Used  . Alcohol Use: 1.0 oz/week    2 drink(s) per week  . Drug Use: No  . Sexual Activity: Not on file   Other Topics Concern  . Not on file   Social History Narrative  . No narrative on file   Family History  Problem Relation Age of Onset  . Breast cancer Mother    Current Outpatient Prescriptions on File Prior to Visit  Medication Sig Dispense Refill  . hydrochlorothiazide (HYDRODIURIL) 25 MG tablet Take 1 tablet (25 mg total) by mouth daily.  90 tablet  3  . nebivolol (BYSTOLIC) 5 MG tablet Take 2.5 mg by mouth daily.      . pantoprazole (PROTONIX) 40 MG tablet Take 1 tablet (40 mg total) by mouth daily.  90 tablet  1  . sertraline (ZOLOFT) 100 MG tablet Take 1 tablet (100 mg total) by mouth daily.  90 tablet  0  . valACYclovir (VALTREX) 1000 MG tablet Take 1 tablet (1,000 mg total) by mouth 3 (three) times daily.  21 tablet  0  . VITAMIN D, CHOLECALCIFEROL, PO Take 50,000 mg by mouth daily.        No current facility-administered medications on file prior to visit.   Allergies  Allergen Reactions  . Penicillins   . Sulfa Antibiotics     There is no immunization history on file for this patient. Prior to Admission medications   Medication Sig Start Date End Date Taking? Authorizing Provider  hydrochlorothiazide (HYDRODIURIL) 25 MG tablet Take 1 tablet (25 mg total) by mouth daily. 08/27/12  Yes Tonny Bollman, MD  nebivolol (BYSTOLIC) 5 MG tablet Take 2.5 mg by mouth daily.   Yes Historical Provider, MD  pantoprazole (PROTONIX) 40 MG tablet Take 1 tablet (40 mg total) by mouth daily. 08/17/12  Yes Ernestina Penna, MD  sertraline (ZOLOFT) 100 MG tablet Take 1 tablet (100 mg total) by mouth daily. 08/17/12  Yes Ernestina Penna, MD  valACYclovir (VALTREX) 1000 MG tablet Take 1 tablet (1,000 mg total) by mouth 3 (three) times daily. 12/03/12   Mary-Margaret Daphine Deutscher, FNP  VITAMIN D, CHOLECALCIFEROL, PO Take 50,000 mg by mouth daily.    Historical Provider, MD     ROS: As above in the HPI. All other systems are stable or negative.  OBJECTIVE: APPEARANCE:  Patient in no acute distress.The patient appeared  well nourished and normally developed. Acyanotic. Waist: VITAL SIGNS:BP 107/71  Pulse 76  Temp(Src) 97 F (36.1 C) (Oral)  Wt 196 lb 3.2 oz (88.996 kg)  BMI 35.88 kg/m2 WF  SKIN: warm and  Dry without overt rashes, tattoos and scars  HEAD and Neck: without JVD, Head and scalp: normal Eyes:No scleral icterus. Fundi normal, eye movements normal. Ears: Auricle normal, canal normal, Tympanic membranes normal, insufflation normal. Nose: normal Throat: normal Neck & thyroid: normal  CHEST & LUNGS: Chest wall: normal Lungs: Clear  CVS: Reveals the PMI to be normally located. Regular rhythm, First and Second Heart sounds are normal,  absence of murmurs, rubs or gallops. Peripheral vasculature: Radial pulses: normal Dorsal pedis pulses: normal Posterior pulses:  normal  ABDOMEN:  Appearance: normal Benign, no organomegaly, no masses, no Abdominal Aortic enlargement. No Guarding , no rebound. No Bruits. Bowel sounds: normal  RECTAL: N/A GU: N/A  EXTREMETIES: nonedematous.  MUSCULOSKELETAL:  Spine: normal Joints: intact  NEUROLOGIC: oriented to time,place and person; nonfocal. Strength is normal Sensory is normal Reflexes are normal Cranial Nerves are normal.  ASSESSMENT: Diarrhea - Plan: POCT CBC, Magnesium  Weakness - Plan: POCT CBC, CMP14+EGFR, Vit D  25 hydroxy (rtn osteoporosis monitoring), Lipid panel, Magnesium   PLAN: Orders Placed This Encounter  Procedures  . CMP14+EGFR  . Vit D  25 hydroxy (rtn osteoporosis monitoring)  . Lipid panel  . Magnesium  . POCT CBC    Await labs. Suspect that her symptoms was due to electrolyte disturbance.  Return if symptoms worsen or fail to improve.  Syd Newsome P. Modesto Charon, M.D.

## 2012-12-12 DIAGNOSIS — R748 Abnormal levels of other serum enzymes: Secondary | ICD-10-CM | POA: Insufficient documentation

## 2012-12-12 LAB — CMP14+EGFR
ALT: 80 IU/L — ABNORMAL HIGH (ref 0–32)
AST: 72 IU/L — ABNORMAL HIGH (ref 0–40)
Albumin/Globulin Ratio: 1.4 (ref 1.1–2.5)
Albumin: 3.9 g/dL (ref 3.5–5.5)
Alkaline Phosphatase: 73 IU/L (ref 39–117)
BUN/Creatinine Ratio: 19 (ref 9–23)
BUN: 12 mg/dL (ref 6–24)
CO2: 22 mmol/L (ref 18–29)
Calcium: 9.1 mg/dL (ref 8.7–10.2)
Chloride: 99 mmol/L (ref 97–108)
Creatinine, Ser: 0.63 mg/dL (ref 0.57–1.00)
GFR calc Af Amer: 125 mL/min/{1.73_m2} (ref >59)
GFR calc non Af Amer: 109 mL/min/{1.73_m2} (ref >59)
Globulin, Total: 2.8 g/dL (ref 1.5–4.5)
Glucose: 118 mg/dL — ABNORMAL HIGH (ref 65–99)
Potassium: 3.5 mmol/L (ref 3.5–5.2)
Sodium: 141 mmol/L (ref 134–144)
Total Bilirubin: 0.3 mg/dL (ref 0.0–1.2)
Total Protein: 6.7 g/dL (ref 6.0–8.5)

## 2012-12-12 LAB — LIPID PANEL
Chol/HDL Ratio: 5.8 ratio units — ABNORMAL HIGH (ref 0.0–4.4)
Cholesterol, Total: 161 mg/dL (ref 100–199)
HDL: 28 mg/dL — ABNORMAL LOW (ref >39)
LDL Calculated: 94 mg/dL (ref 0–99)
Triglycerides: 196 mg/dL — ABNORMAL HIGH (ref 0–149)
VLDL Cholesterol Cal: 39 mg/dL (ref 5–40)

## 2012-12-12 LAB — SPECIMEN STATUS REPORT

## 2012-12-12 LAB — VITAMIN D 25 HYDROXY (VIT D DEFICIENCY, FRACTURES): Vit D, 25-Hydroxy: 23.2 ng/mL — ABNORMAL LOW (ref 30.0–100.0)

## 2012-12-12 LAB — MAGNESIUM: Magnesium: 1.7 mg/dL (ref 1.6–2.6)

## 2012-12-12 NOTE — Addendum Note (Signed)
Addended by: Ileana Ladd on: 12/12/2012 12:20 PM   Modules accepted: Orders

## 2012-12-13 LAB — HEPATITIS PANEL, ACUTE
Hep A IgM: NEGATIVE
Hep B C IgM: NEGATIVE
Hep C Virus Ab: <0.1 s/co ratio (ref 0.0–0.9)
Hepatitis B Surface Ag: NEGATIVE

## 2012-12-13 LAB — SPECIMEN STATUS REPORT

## 2012-12-14 NOTE — Progress Notes (Signed)
Quick Note:  Labs abnormal. Tell [name] that the lipids needs to be better. Vegan diet!! The Vitamin D is a little low. She needs to take the Vitamin D and get 20 minutes sunshine daily. Recheck the lipids and Vitamin D in 3 months. ______

## 2013-04-04 ENCOUNTER — Other Ambulatory Visit: Payer: Self-pay | Admitting: *Deleted

## 2013-04-04 DIAGNOSIS — L659 Nonscarring hair loss, unspecified: Secondary | ICD-10-CM

## 2013-05-27 ENCOUNTER — Other Ambulatory Visit: Payer: Self-pay

## 2013-05-27 MED ORDER — SERTRALINE HCL 100 MG PO TABS: 100.0000 mg | ORAL_TABLET | Freq: Every day | ORAL | Status: DC

## 2013-05-27 MED ORDER — PANTOPRAZOLE SODIUM 40 MG PO TBEC: 40.0000 mg | DELAYED_RELEASE_TABLET | Freq: Every day | ORAL | Status: DC

## 2013-05-27 NOTE — Telephone Encounter (Signed)
x

## 2013-05-30 ENCOUNTER — Other Ambulatory Visit: Payer: Self-pay

## 2013-05-30 ENCOUNTER — Telehealth: Payer: Self-pay

## 2013-05-30 MED ORDER — KETOPROFEN 75 MG PO CAPS: 75.0000 mg | ORAL_CAPSULE | Freq: Four times a day (QID) | ORAL | Status: DC | PRN

## 2013-05-30 NOTE — Telephone Encounter (Signed)
Call you please call in Ketoprofen 75 mg take 1 tablet 3 times a day as needed for headache

## 2013-06-06 ENCOUNTER — Other Ambulatory Visit: Payer: Self-pay | Admitting: Nurse Practitioner

## 2013-06-06 MED ORDER — DOXYCYCLINE HYCLATE 100 MG PO TABS: 100.0000 mg | ORAL_TABLET | Freq: Two times a day (BID) | ORAL | Status: DC

## 2013-08-12 ENCOUNTER — Other Ambulatory Visit: Payer: Self-pay | Admitting: *Deleted

## 2013-08-12 ENCOUNTER — Ambulatory Visit (INDEPENDENT_AMBULATORY_CARE_PROVIDER_SITE_OTHER): Payer: 59 | Admitting: Family

## 2013-08-12 ENCOUNTER — Encounter: Payer: Self-pay | Admitting: Family

## 2013-08-12 VITALS — BP 156/78 | HR 74 | Temp 97.4°F

## 2013-08-12 DIAGNOSIS — J020 Streptococcal pharyngitis: Secondary | ICD-10-CM

## 2013-08-12 DIAGNOSIS — H109 Unspecified conjunctivitis: Secondary | ICD-10-CM

## 2013-08-12 MED ORDER — BENZOYL PEROXIDE-ERYTHROMYCIN 5-3 % EX GEL: Freq: Four times a day (QID) | CUTANEOUS | Status: DC

## 2013-08-12 NOTE — Progress Notes (Signed)
° °  Subjective:    Patient ID: [name], female    DOB: [birth year], 48 y.o.   MRN: [id number]  Conjunctivitis  The current episode started today. The onset was sudden. The problem occurs rarely. The problem has been gradually worsening. The problem is mild. Nothing relieves the symptoms. Associated symptoms include headaches, eye discharge and eye redness. Pertinent negatives include no fever, no decreased vision, no double vision and no eye itching. The eye pain is moderate. Both eyes are affected.There were no sick contacts.      Review of Systems  Constitutional: Negative.  Negative for fever.  Eyes: Positive for discharge and redness. Negative for double vision and itching.  Respiratory: Negative.   Gastrointestinal: Negative.   Endocrine: Negative.   Genitourinary: Negative.   Musculoskeletal: Negative.   Allergic/Immunologic: Negative.   Neurological: Positive for headaches.  Hematological: Negative.   Psychiatric/Behavioral: Negative.   All other systems reviewed and are negative.      Objective:   Physical Exam  Vitals reviewed. Constitutional: She is oriented to person, place, and time. She appears well-developed and well-nourished.  HENT:  Mouth/Throat: Oropharynx is clear and moist.  Bilaterally eyes redness   Eyes: Right eye exhibits discharge.  Neck: Normal range of motion. No thyromegaly present.  Cardiovascular: Normal rate, regular rhythm, normal heart sounds and intact distal pulses.   No murmur heard. Pulmonary/Chest: Effort normal and breath sounds normal. No respiratory distress.  Musculoskeletal: Normal range of motion.  Neurological: She is alert and oriented to person, place, and time.  Skin: Skin is warm and dry.  Psychiatric: She has a normal mood and affect. Her behavior is normal. Judgment and thought content normal.     BP 156/78   Pulse 74   Temp(Src) 97.4 F (36.3 C) (Oral)      Assessment & Plan:  1. Conjunctivitis - benzoyl  peroxide-erythromycin (BENZAMYCIN) gel; Apply topically QID.  Dispense: 23.3 g; Refill: 0 -Warm compress on eyes if helps -Wash hands to reduce infection -Avoid contacts and eye make-up for the next 5 days or until improvement  Evelina Dun, FNP

## 2013-08-12 NOTE — Patient Instructions (Signed)

## 2013-08-13 ENCOUNTER — Telehealth: Payer: Self-pay | Admitting: *Deleted

## 2013-08-13 MED ORDER — ERYTHROMYCIN 5 MG/GM OP OINT: 1.0000 "application " | TOPICAL_OINTMENT | Freq: Four times a day (QID) | OPHTHALMIC | Status: DC

## 2013-08-13 NOTE — Telephone Encounter (Signed)
Incorrect medication sent to pharmacy to treat conjunctivitis. Benzamycin gel d/c and erythromycin ointment authorized in it's place.

## 2013-08-15 ENCOUNTER — Other Ambulatory Visit: Payer: Self-pay

## 2013-08-15 ENCOUNTER — Other Ambulatory Visit: Payer: Self-pay | Admitting: Family Medicine

## 2013-08-15 DIAGNOSIS — Z1231 Encounter for screening mammogram for malignant neoplasm of breast: Secondary | ICD-10-CM

## 2013-08-23 ENCOUNTER — Ambulatory Visit (INDEPENDENT_AMBULATORY_CARE_PROVIDER_SITE_OTHER): Payer: 59 | Admitting: Physician Assistant

## 2013-08-23 ENCOUNTER — Encounter: Payer: Self-pay | Admitting: Physician Assistant

## 2013-08-23 VITALS — BP 140/82 | HR 73 | Temp 98.3°F | Ht 62.0 in

## 2013-08-23 DIAGNOSIS — L821 Other seborrheic keratosis: Secondary | ICD-10-CM

## 2013-08-23 NOTE — Progress Notes (Signed)
Patient ID: [name], female   DOB: 19-Jun-1965, 48 y.o.   MRN: [id number]  48 y/o female presents with enlarging lesion on top of scalp that is bothersome when brushing her hair. Asymptomatic regarding pain and pruritis.   PE reveals flesh colored seborrheic keratosis on scalp. Benign in appearance. Approximately 1 cm in diameter.   A/P:   Seborrheic Keratosis: Tx w/ cryosurgery x 1. Advised patient to use vaseline for healing. May need add'l treatment due to thickness of lesion but preferred not to shave due to secondary loss of hair when shaving. If lesion recurs, will biopsy to r/o dysplasia

## 2013-08-29 ENCOUNTER — Ambulatory Visit
Admission: RE | Admit: 2013-08-29 | Discharge: 2013-08-29 | Disposition: A | Discharge status: Home / Self Care | Payer: 59 | Source: Ambulatory Visit | Attending: Family Medicine | Admitting: Family Medicine

## 2013-08-29 DIAGNOSIS — Z1231 Encounter for screening mammogram for malignant neoplasm of breast: Secondary | ICD-10-CM

## 2013-09-26 ENCOUNTER — Other Ambulatory Visit: Payer: Self-pay

## 2013-09-26 DIAGNOSIS — I10 Essential (primary) hypertension: Secondary | ICD-10-CM

## 2013-09-26 MED ORDER — HYDROCHLOROTHIAZIDE 25 MG PO TABS: 25.0000 mg | ORAL_TABLET | Freq: Every day | ORAL | Status: DC

## 2013-09-26 MED ORDER — PANTOPRAZOLE SODIUM 40 MG PO TBEC: 40.0000 mg | DELAYED_RELEASE_TABLET | Freq: Every day | ORAL | Status: DC

## 2013-09-26 MED ORDER — SERTRALINE HCL 100 MG PO TABS: 100.0000 mg | ORAL_TABLET | Freq: Every day | ORAL | Status: DC

## 2013-10-21 ENCOUNTER — Other Ambulatory Visit: Payer: Self-pay | Admitting: Nurse Practitioner

## 2013-10-21 MED ORDER — CIPROFLOXACIN HCL 500 MG PO TABS: 500.0000 mg | ORAL_TABLET | Freq: Two times a day (BID) | ORAL | Status: DC

## 2013-11-07 ENCOUNTER — Ambulatory Visit (INDEPENDENT_AMBULATORY_CARE_PROVIDER_SITE_OTHER): Payer: 59 | Admitting: Family Medicine

## 2013-11-07 VITALS — BP 145/91 | HR 89 | Temp 97.4°F

## 2013-11-07 DIAGNOSIS — J029 Acute pharyngitis, unspecified: Secondary | ICD-10-CM

## 2013-11-07 DIAGNOSIS — N951 Menopausal and female climacteric states: Secondary | ICD-10-CM

## 2013-11-07 DIAGNOSIS — R232 Flushing: Secondary | ICD-10-CM

## 2013-11-07 MED ORDER — CLONIDINE HCL 0.1 MG PO TABS: ORAL_TABLET | ORAL | Status: DC

## 2013-11-07 MED ORDER — NYSTATIN 100000 UNIT/ML MT SUSP: 5.0000 mL | Freq: Four times a day (QID) | OROMUCOSAL | Status: DC

## 2013-11-07 MED ORDER — FLUCONAZOLE 150 MG PO TABS: 150.0000 mg | ORAL_TABLET | Freq: Once | ORAL | Status: DC

## 2013-11-07 MED ORDER — AZITHROMYCIN 250 MG PO TABS: ORAL_TABLET | ORAL | Status: DC

## 2013-11-07 MED ORDER — HYDROCODONE-ACETAMINOPHEN 5-325 MG PO TABS: 1.0000 | ORAL_TABLET | Freq: Four times a day (QID) | ORAL | Status: DC | PRN

## 2013-11-07 NOTE — Progress Notes (Signed)
   Subjective:    Patient ID: AURORA RODY, female    DOB: [birth year], 48 y.o.   MRN: [id number]  HPI This 48 y.o. female presents for evaluation of severe sore throat.  She is having difficulty swallowing and she is having severe pain that kept her up all night last night.  She has difficulty with sleep due to Vasomotor sx's ever since she had total hysterectomy.  She states she has just finished 2 weeks of cipro for an infection in her right wrist.   Review of Systems C/o dysphasia, pharyngitis, and hot flashes No chest pain, SOB, HA, dizziness, vision change, N/V, diarrhea, constipation, dysuria, urinary urgency or frequency, myalgias, arthralgias or rash.     Objective:   Physical Exam  Vital signs noted  Well developed well nourished female.  HEENT - Head atraumatic Normocephalic                Eyes - PERRLA, Conjuctiva - clear Sclera- Clear EOMI                Ears - EAC's Wnl TM's Wnl Gross Hearing WNL                Nose - Nares patent                 Throat - oropharanx erythematous and swollen posterior pharynx. Respiratory - Lungs CTA bilateral Cardiac - RRR S1 and S2 without murmur GI - Abdomen soft Nontender and bowel sounds active x 4       Assessment & Plan:  Hot flashes - Plan: azithromycin (ZITHROMAX) 250 MG tablet, nystatin (MYCOSTATIN) 100000 UNIT/ML suspension, fluconazole (DIFLUCAN) 150 MG tablet, cloNIDine (CATAPRES) 0.1 MG tablet, HYDROcodone-acetaminophen (NORCO) 5-325 MG per tablet  Pharyngitis - Plan: azithromycin (ZITHROMAX) 250 MG tablet, nystatin (MYCOSTATIN) 100000 UNIT/ML suspension, fluconazole (DIFLUCAN) 150 MG tablet, cloNIDine (CATAPRES) 0.1 MG tablet, HYDROcodone-acetaminophen (NORCO) 5-325 MG per tablet  Pharyngitis is probably due to yeast infection of pharynx.    Lysbeth Penner FNP

## 2013-11-08 ENCOUNTER — Other Ambulatory Visit: Payer: Self-pay | Admitting: Family Medicine

## 2013-11-08 MED ORDER — LIDOCAINE VISCOUS 2 % MT SOLN: 20.0000 mL | OROMUCOSAL | Status: DC | PRN

## 2013-11-09 ENCOUNTER — Encounter (HOSPITAL_COMMUNITY): Payer: Self-pay | Admitting: Emergency Medicine

## 2013-11-09 ENCOUNTER — Emergency Department (HOSPITAL_COMMUNITY)
Admission: EM | Admit: 2013-11-09 | Discharge: 2013-11-09 | Disposition: A | Discharge status: Home / Self Care | Payer: 59 | Attending: Emergency Medicine | Admitting: Emergency Medicine

## 2013-11-09 ENCOUNTER — Emergency Department (HOSPITAL_COMMUNITY): Payer: 59

## 2013-11-09 ENCOUNTER — Other Ambulatory Visit (HOSPITAL_COMMUNITY): Payer: 59

## 2013-11-09 DIAGNOSIS — J028 Acute pharyngitis due to other specified organisms: Secondary | ICD-10-CM

## 2013-11-09 DIAGNOSIS — F17200 Nicotine dependence, unspecified, uncomplicated: Secondary | ICD-10-CM | POA: Insufficient documentation

## 2013-11-09 DIAGNOSIS — K219 Gastro-esophageal reflux disease without esophagitis: Secondary | ICD-10-CM | POA: Insufficient documentation

## 2013-11-09 DIAGNOSIS — Z79899 Other long term (current) drug therapy: Secondary | ICD-10-CM | POA: Insufficient documentation

## 2013-11-09 DIAGNOSIS — Z880 Allergy status to penicillin: Secondary | ICD-10-CM | POA: Insufficient documentation

## 2013-11-09 DIAGNOSIS — J029 Acute pharyngitis, unspecified: Secondary | ICD-10-CM | POA: Insufficient documentation

## 2013-11-09 DIAGNOSIS — F411 Generalized anxiety disorder: Secondary | ICD-10-CM | POA: Insufficient documentation

## 2013-11-09 DIAGNOSIS — B9789 Other viral agents as the cause of diseases classified elsewhere: Secondary | ICD-10-CM

## 2013-11-09 LAB — BASIC METABOLIC PANEL
ANION GAP: 16 — AB (ref 5–15)
BUN: 10 mg/dL (ref 6–23)
CALCIUM: 9.8 mg/dL (ref 8.4–10.5)
CO2: 24 mEq/L (ref 19–32)
Chloride: 95 mEq/L — ABNORMAL LOW (ref 96–112)
Creatinine, Ser: 0.59 mg/dL (ref 0.50–1.10)
GFR calc Af Amer: >90 mL/min (ref >90)
GFR calc non Af Amer: >90 mL/min (ref >90)
GLUCOSE: 133 mg/dL — AB (ref 70–99)
Potassium: 4.1 mEq/L (ref 3.7–5.3)
SODIUM: 135 meq/L — AB (ref 137–147)

## 2013-11-09 LAB — CBC
HCT: 45.7 % (ref 36.0–46.0)
Hemoglobin: 15.8 g/dL — ABNORMAL HIGH (ref 12.0–15.0)
MCH: 32.4 pg (ref 26.0–34.0)
MCHC: 34.6 g/dL (ref 30.0–36.0)
MCV: 93.6 fL (ref 78.0–100.0)
PLATELETS: 271 10*3/uL (ref 150–400)
RBC: 4.88 MIL/uL (ref 3.87–5.11)
RDW: 13.1 % (ref 11.5–15.5)
WBC: 14.6 10*3/uL — ABNORMAL HIGH (ref 4.0–10.5)

## 2013-11-09 LAB — I-STAT TROPONIN, ED: Troponin i, poc: 0.01 ng/mL (ref 0.00–0.08)

## 2013-11-09 LAB — TROPONIN I: Troponin I: <0.3 ng/mL (ref <0.30)

## 2013-11-09 MED ORDER — DIPHENHYDRAMINE HCL 50 MG/ML IJ SOLN
25.0000 mg | Freq: Once | INTRAMUSCULAR | Status: AC
  Administered 2013-11-09: 25 mg via INTRAVENOUS
  Filled 2013-11-09: qty 1

## 2013-11-09 MED ORDER — GI COCKTAIL ~~LOC~~
30.0000 mL | Freq: Once | ORAL | Status: AC
  Administered 2013-11-09: 30 mL via ORAL
  Filled 2013-11-09: qty 30

## 2013-11-09 MED ORDER — SODIUM CHLORIDE 0.9 % IV BOLUS (SEPSIS)
1000.0000 mL | Freq: Once | INTRAVENOUS | Status: AC
  Administered 2013-11-09: 1000 mL via INTRAVENOUS

## 2013-11-09 MED ORDER — IOHEXOL 300 MG/ML  SOLN
75.0000 mL | Freq: Once | INTRAMUSCULAR | Status: AC | PRN
  Administered 2013-11-09: 75 mL via INTRAVENOUS

## 2013-11-09 MED ORDER — DEXAMETHASONE SODIUM PHOSPHATE 10 MG/ML IJ SOLN: 10.0000 mg | Freq: Once | INTRAMUSCULAR | Status: DC

## 2013-11-09 MED ORDER — DEXAMETHASONE SODIUM PHOSPHATE 4 MG/ML IJ SOLN
10.0000 mg | Freq: Once | INTRAMUSCULAR | Status: AC
  Administered 2013-11-09: 10 mg via INTRAVENOUS
  Filled 2013-11-09: qty 3

## 2013-11-09 MED ORDER — KETOROLAC TROMETHAMINE 30 MG/ML IJ SOLN
30.0000 mg | Freq: Once | INTRAMUSCULAR | Status: AC
  Administered 2013-11-09: 30 mg via INTRAVENOUS
  Filled 2013-11-09: qty 1

## 2013-11-09 MED ORDER — METOCLOPRAMIDE HCL 5 MG/ML IJ SOLN
10.0000 mg | Freq: Once | INTRAMUSCULAR | Status: AC
  Administered 2013-11-09: 10 mg via INTRAVENOUS
  Filled 2013-11-09: qty 2

## 2013-11-09 NOTE — ED Notes (Signed)
While walking back to room pt grabbed her chest and states she has been having intermittent pains to her chest that take her breath away. Pt initially described pain in her throat with swallowing- this chest pain episode was unrelated her her swallowing. On arrival to room EKG obtained.

## 2013-11-09 NOTE — ED Provider Notes (Signed)
Medical screening examination/treatment/procedure(s) were conducted as a shared visit with non-physician practitioner(s) and myself.  I personally evaluated the patient during the encounter.  5 days of sore throat, pain with swallowing.  Rapid strep negative. Treated with diflucan by PCP. No drooling or vomiting.  Burning central chest pain lasting <1 min at a time, atypical for ACS. OP clear, no asymmetry, no drooling. No tongue elevation or trismus. No FB on imaging.   EKG Interpretation   Date/Time:  Saturday November 09 2013 11:53:22 EDT Ventricular Rate:  104 PR Interval:  174 QRS Duration: 87 QT Interval:  331 QTC Calculation: 435 R Axis:   20 Text Interpretation:  Sinus tachycardia Ventricular premature complex  Baseline wander in lead(s) II III aVR aVF V3 V4 V5 V6 rater faster  Confirmed by Wyvonnia Dusky  MD, Tobby Fawcett [phone]) on 11/09/2013 12:01:09 PM       Ezequiel Essex, MD 11/09/13 [id number]

## 2013-11-09 NOTE — Discharge Instructions (Signed)

## 2013-11-09 NOTE — ED Provider Notes (Signed)
CSN: [id number]     Arrival date & time 11/09/13  1136 History   First MD Initiated Contact with Patient 11/09/13 1159     Chief Complaint  Patient presents with  . Sore Throat  . Chest Pain   HPI Comments: Patient is a 48 y.o. Female who presents to the ED with sore throat and chest pain.  Patient states that she has had sore throat since Tuesday.  Patients sore throat is sharp and makes it difficult for her to swallow, sleep, and eat due to pain.  She has been checked for strep throat multiple times since Tuesday as she works at a doctors office but has come up negative on all the rapid strep screens.  Patient also saw her PCP who thought she may have a candidal infection as she recently finished antibiotics for a soft tissue infection.  Patient has been on diflucan and oral nystatin with no relief of her symptoms.  Patient was prescribed viscous lidocaine with little relief.  Patient states that she also started developing some burning intermittent chest pain which she attributes to her GERD.  Patient states that this pain feels like heart burn and has a history of GERD.  Patient states that she has been taking lots of goody powder and also ibuprofen on an empty stomach.     Patient is a 48 y.o. female presenting with pharyngitis and chest pain. The history is provided by the patient. No language interpreter was used.  Sore Throat Associated symptoms include chest pain, chills, fatigue and a sore throat. Pertinent negatives include no abdominal pain, congestion, coughing, fever, nausea or vomiting.  Chest Pain Associated symptoms: fatigue   Associated symptoms: no abdominal pain, no cough, no dysphagia, no fever, no nausea, no palpitations, no shortness of breath and not vomiting     Past Medical History  Diagnosis Date  . Esophageal reflux   . Anxiety    Past Surgical History  Procedure Laterality Date  . Laparoscopic vaginal hysterectomy N/A 05 10 2012   Family History  Problem  Relation Age of Onset  . Breast cancer Mother    History  Substance Use Topics  . Smoking status: Current Every Day Smoker  . Smokeless tobacco: Never Used  . Alcohol Use: 1.0 oz/week    2 drink(s) per week   OB History   Grav Para Term Preterm Abortions TAB SAB Ect Mult Living                 Review of Systems  Constitutional: Positive for chills and fatigue. Negative for fever.  HENT: Positive for sore throat. Negative for congestion, drooling, ear pain, mouth sores, nosebleeds, postnasal drip, trouble swallowing and voice change.   Respiratory: Negative for cough, chest tightness and shortness of breath.   Cardiovascular: Positive for chest pain. Negative for palpitations and leg swelling.  Gastrointestinal: Negative for nausea, vomiting, abdominal pain, diarrhea, constipation and blood in stool.  All other systems reviewed and are negative.     Allergies  Penicillins and Sulfa antibiotics  Home Medications   Prior to Admission medications   Medication Sig Start Date End Date Taking? Authorizing Provider  hydrochlorothiazide (HYDRODIURIL) 25 MG tablet Take 1 tablet (25 mg total) by mouth daily. 09/26/13  Yes Tiffany Gann, PA-C  HYDROcodone-acetaminophen (NORCO/VICODIN) 5-325 MG per tablet Take 1 tablet by mouth every 4 (four) hours as needed for moderate pain.   Yes Historical Provider, MD  ketoprofen (ORUDIS) 75 MG capsule Take 75 mg by  mouth 4 (four) times daily as needed (for headache).   Yes Historical Provider, MD  lidocaine (XYLOCAINE) 2 % solution Use as directed 20 mLs in the mouth or throat every 3 (three) hours as needed for mouth pain. 11/08/13  Yes Lysbeth Penner, FNP  nebivolol (BYSTOLIC) 5 MG tablet Take 2.5 mg by mouth daily.   Yes Historical Provider, MD  nystatin (MYCOSTATIN) 100000 UNIT/ML suspension Take 5 mLs (500,000 Units total) by mouth 4 (four) times daily. 11/07/13  Yes Lysbeth Penner, FNP  pantoprazole (PROTONIX) 40 MG tablet Take 1 tablet (40 mg  total) by mouth daily. 09/26/13  Yes Tiffany Gann, PA-C  sertraline (ZOLOFT) 100 MG tablet Take 1 tablet (100 mg total) by mouth daily. 09/26/13  Yes Tiffany Gann, PA-C   BP 127/84  Pulse 87  Temp(Src) 98.4 F (36.9 C) (Oral)  Resp 21  Wt 205 lb (92.987 kg)  SpO2 92% Physical Exam  Nursing note and vitals reviewed. Constitutional: She is oriented to person, place, and time. She appears well-developed and well-nourished. No distress.  HENT:  Head: Normocephalic and atraumatic.  Mouth/Throat: Oropharynx is clear and moist. No oropharyngeal exudate.  Eyes: Conjunctivae and EOM are normal. Pupils are equal, round, and reactive to light. No scleral icterus.  Neck: Normal range of motion. Neck supple. No JVD present. No thyromegaly present.  Cardiovascular: Normal rate, regular rhythm, normal heart sounds and intact distal pulses.  Exam reveals no gallop and no friction rub.   No murmur heard. Pulmonary/Chest: Effort normal and breath sounds normal. No respiratory distress. She has no wheezes. She has no rales. She exhibits no tenderness.  Abdominal: Soft. Bowel sounds are normal. She exhibits no distension and no mass. There is no tenderness. There is no rebound and no guarding.  Musculoskeletal: Normal range of motion.  Lymphadenopathy:    She has cervical adenopathy.  Neurological: She is alert and oriented to person, place, and time.  Skin: Skin is warm and dry. She is not diaphoretic.  Psychiatric: She has a normal mood and affect. Her behavior is normal. Judgment and thought content normal.    ED Course  Procedures (including critical care time) Labs Review Labs Reviewed  CBC - Abnormal; Notable for the following:    WBC 14.6 (*)    Hemoglobin 15.8 (*)    All other components within normal limits  BASIC METABOLIC PANEL - Abnormal; Notable for the following:    Sodium 135 (*)    Chloride 95 (*)    Glucose, Bld 133 (*)    Anion gap 16 (*)    All other components within normal  limits  TROPONIN I  Randolm Idol, ED    Imaging Review Dg Chest 2 View  11/09/2013   CLINICAL DATA:  Difficulty swallowing.  Sore throat.  EXAM: CHEST  2 VIEW  COMPARISON:  Chest x-ray 09/08/2010.  FINDINGS: Lung volumes are normal. No consolidative airspace disease. No pleural effusions. No pneumothorax. No pulmonary nodule or mass noted. Pulmonary vasculature and the cardiomediastinal silhouette are within normal limits. Orthopedic fixation hardware in the lower cervical spine.  IMPRESSION: No radiographic evidence of acute cardiopulmonary disease.   Electronically Signed   By: Vinnie Langton M.D.   On: 11/09/2013 13:18   Ct Soft Tissue Neck W Contrast  11/09/2013   CLINICAL DATA:  Sore throat.  EXAM: CT NECK WITH CONTRAST  TECHNIQUE: Multidetector CT imaging of the neck was performed using the standard protocol following the bolus administration of intravenous contrast.  CONTRAST:  76mL OMNIPAQUE IOHEXOL 300 MG/ML  SOLN  COMPARISON:  No priors.  FINDINGS: Mildly enlarged submandibular lymph nodes bilaterally measuring up to 1.5 cm in long axis. No significant thickening of the nasopharynx, oropharynx or hypopharynx. No prevertebral soft tissue thickening or abnormal prevertebral fluid collections. No foreign body identified within the throat or proximal esophagus. Proximal trachea is unremarkable in appearance. Visualized paranasal sinuses and mastoids are well pneumatized. Limited visualization of the intracranial contents is unremarkable. Postoperative changes of ACDF at C5-C6 are noted, with an interbody graft at the C5-C6 interspace.  IMPRESSION: 1. No acute findings to account for the patient's symptoms. 2. Mildly enlarged submandibular lymph nodes bilaterally are favored to be reactive, given the patient's signs and symptoms of acute upper respiratory tract infection.   Electronically Signed   By: Vinnie Langton M.D.   On: 11/09/2013 16:12     EKG Interpretation   Date/Time:   Saturday November 09 2013 11:53:22 EDT Ventricular Rate:  104 PR Interval:  174 QRS Duration: 87 QT Interval:  331 QTC Calculation: 435 R Axis:   20 Text Interpretation:  Sinus tachycardia Ventricular premature complex  Baseline wander in lead(s) II III aVR aVF V3 V4 V5 V6 rater faster  Confirmed by Wyvonnia Dusky  MD, STEPHEN [phone]) on 11/09/2013 12:01:09 PM      MDM   Final diagnoses:  None   Labs Reviewed  CBC - Abnormal; Notable for the following:    WBC 14.6 (*)    Hemoglobin 15.8 (*)    All other components within normal limits  BASIC METABOLIC PANEL - Abnormal; Notable for the following:    Sodium 135 (*)    Chloride 95 (*)    Glucose, Bld 133 (*)    Anion gap 16 (*)    All other components within normal limits  TROPONIN I  I-STAT TROPOININ, ED   Patient presents to the ED with sore throat and chest pain.  Patient has mild elevation of leukocytosis at this time with WBC of 14.6 and hemoglobin of 15.8.  Suspect that this is likely due to some hemoconcentration as the patient has not been eating and drinking well.  Patient is mildly hyponatremic and hypochloremic supporting possible dehydration.  Given chest pain patient had cardiac workup including CXR, troponin.  Troponin here in the ED is negative.  CXR shows no acute cardiopulmonary process.  Given severity of sore throat the patient had CT soft tissue neck which also showed no acute abnormality.  Patient was treated here with headache cocktail, GI cocktail, and 1000 mL NS bolus.  Patient reports that all symptoms are gone at this time.  Suspect chest pain was likely due to GERD given rapid improvement with GI cocktail and negative cardiac workup.  I believe that patient is suffering from viral pharyngitis.  Patient was instructed to drink plenty of fluids, take tylenol and ibuprofen as needed for myalgias and to use viscous lidocaine as needed for sore throat.  Patient states understanding and agreement at this time.  Patient is stable  for discharge.  Patient was also seen by Dr. Wyvonnia Dusky who agrees with the above plan.  Patient was told to return for trismus, drooling, and swelling of the neck.      Cherylann Parr, PA-C 11/09/13 1702

## 2013-11-09 NOTE — ED Notes (Signed)
Pt in c/o sore throat since Tuesday, states she feels like something is stuck in her throat, states the pain has gotten progressively worse, pt seen for same multiple times and cannot get relief and is unable to eat due to pain, denies fever

## 2014-01-06 ENCOUNTER — Other Ambulatory Visit: Payer: Self-pay | Admitting: *Deleted

## 2014-01-06 MED ORDER — NEBIVOLOL HCL 5 MG PO TABS: 2.5000 mg | ORAL_TABLET | Freq: Every day | ORAL | Status: DC

## 2014-01-06 NOTE — Telephone Encounter (Signed)
Samples provided 

## 2014-01-13 ENCOUNTER — Other Ambulatory Visit: Payer: Self-pay

## 2014-01-13 MED ORDER — PANTOPRAZOLE SODIUM 40 MG PO TBEC: 40.0000 mg | DELAYED_RELEASE_TABLET | Freq: Every day | ORAL | Status: DC

## 2014-01-13 MED ORDER — SERTRALINE HCL 100 MG PO TABS: 100.0000 mg | ORAL_TABLET | Freq: Every day | ORAL | Status: DC

## 2014-01-24 ENCOUNTER — Telehealth: Payer: Self-pay | Admitting: *Deleted

## 2014-01-24 ENCOUNTER — Other Ambulatory Visit: Payer: Self-pay | Admitting: Nurse Practitioner

## 2014-01-24 MED ORDER — CEPHALEXIN 500 MG PO CAPS: 500.0000 mg | ORAL_CAPSULE | Freq: Four times a day (QID) | ORAL | Status: DC

## 2014-01-24 MED ORDER — DOXYCYCLINE HYCLATE 100 MG PO TABS: 100.0000 mg | ORAL_TABLET | Freq: Two times a day (BID) | ORAL | Status: DC

## 2014-01-24 NOTE — Telephone Encounter (Deleted)
Patient concerned about reaction to Keflex since she is allergic to Penicillin. Would doxycycline be appropriate? She has taken this with no problems.

## 2014-01-24 NOTE — Telephone Encounter (Signed)
Multiple abscesses under left arm due to hydradenitis suppurativa. This is a recurrent problem.   Can you send in antibiotic to [facility]? She is allergic to PCN and Sulfa.

## 2014-01-24 NOTE — Telephone Encounter (Signed)
Patient aware.

## 2014-01-28 ENCOUNTER — Encounter: Payer: Self-pay | Admitting: Nurse Practitioner

## 2014-01-28 ENCOUNTER — Ambulatory Visit (INDEPENDENT_AMBULATORY_CARE_PROVIDER_SITE_OTHER): Payer: 59 | Admitting: Nurse Practitioner

## 2014-01-28 VITALS — BP 132/84 | HR 88 | Temp 99.6°F | Ht 62.0 in | Wt 205.0 lb

## 2014-01-28 DIAGNOSIS — L02412 Cutaneous abscess of left axilla: Secondary | ICD-10-CM

## 2014-01-28 DIAGNOSIS — L732 Hidradenitis suppurativa: Secondary | ICD-10-CM

## 2014-01-28 MED ORDER — HYDROCODONE-ACETAMINOPHEN 5-325 MG PO TABS: 1.0000 | ORAL_TABLET | ORAL | Status: DC | PRN

## 2014-01-28 NOTE — Addendum Note (Signed)
Addended by: Chevis Pretty on: 01/28/2014 04:58 PM   Modules accepted: Orders

## 2014-01-28 NOTE — Patient Instructions (Signed)
Hidradenitis Suppurativa, Sweat Gland Abscess Hidradenitis suppurativa is a long lasting (chronic), uncommon disease of the sweat glands. With this, boil-like lumps and scarring develop in the groin, some times under the arms (axillae), and under the breasts. It may also uncommonly occur behind the ears, in the crease of the buttocks, and around the genitals.  CAUSES  The cause is from a blocking of the sweat glands. They then become infected. It may cause drainage and odor. It is not contagious. So it cannot be given to someone else. It most often shows up in puberty (about 10 to 47 years of age). But it may happen much later. It is similar to acne which is a disease of the sweat glands. This condition is slightly more common in African-Americans and women. SYMPTOMS   Hidradenitis usually starts as one or more red, tender, swellings in the groin or under the arms (axilla).  Over a period of hours to days the lesions get larger. They often open to the skin surface, draining clear to yellow-colored fluid.  The infected area heals with scarring. DIAGNOSIS  Your caregiver makes this diagnosis by looking at you. Sometimes cultures (growing germs on plates in the lab) may be taken. This is to see what germ (bacterium) is causing the infection.  TREATMENT   Topical germ killing medicine applied to the skin (antibiotics) are the treatment of choice. Antibiotics taken by mouth (systemic) are sometimes needed when the condition is getting worse or is severe.  Avoid tight-fitting clothing which traps moisture in.  Dirt does not cause hidradenitis and it is not caused by poor hygiene.  Involved areas should be cleaned daily using an antibacterial soap. Some patients find that the liquid form of Lever 2000, applied to the involved areas as a lotion after bathing, can help reduce the odor related to this condition.  Sometimes surgery is needed to drain infected areas or remove scarred tissue. Removal of  large amounts of tissue is used only in severe cases.  Birth control pills may be helpful.  Oral retinoids (vitamin A derivatives) for 6 to 12 months which are effective for acne may also help this condition.  Weight loss will improve but not cure hidradenitis. It is made worse by being overweight. But the condition is not caused by being overweight.  This condition is more common in people who have had acne.  It may become worse under stress. There is no medical cure for hidradenitis. It can be controlled, but not cured. The condition usually continues for years with periods of getting worse and getting better (remission). Document Released: 11/17/2003 Document Revised: 06/27/2011 Document Reviewed: 07/05/2013 ExitCare Patient Information 2015 [employer]. This information is not intended to replace advice given to you by your health care provider. Make sure you discuss any questions you have with your health care provider.  

## 2014-01-28 NOTE — Progress Notes (Signed)
   Subjective:    Patient ID: [name], female    DOB: [birth year], 48 y.o.   MRN: [id number]  HPI Patient in today with abscess left Darra Lis- has a long history of hydradenitis. Has been on doycycline for 3 days.    Review of Systems  Constitutional: Negative.   HENT: Negative.   Respiratory: Negative.   Cardiovascular: Negative.   Genitourinary: Negative.   Neurological: Negative.   Psychiatric/Behavioral: Negative.   All other systems reviewed and are negative.      Objective:   Physical Exam  Constitutional: She appears well-developed and well-nourished.  Cardiovascular: Normal rate and normal heart sounds.   Pulmonary/Chest: Effort normal and breath sounds normal.  Skin:  3cm fluctuant erythematous abscess left axillia- VERY tender to touch.    BP 132/84  Pulse 88  Temp(Src) 99.6 F (37.6 C) (Oral)  Ht 5\' 2"  (1.575 m)  Wt 205 lb (92.987 kg)  BMI 37.49 kg/m2  Procedure- Lidocaine !% plain 2cc local  Betadine prep  #11 blade  Exsanguinated copious amounts of yellow excudate  Cleaned with Nacl  Dressing applied         Assessment & Plan:   1. Hydradenitis   2. Cutaneous abscess of left axilla    Keep clean and dry Continue doxycycline as rx Avoid antiperspirants- deodorant only RTO prn  [name], FNP

## 2014-03-21 ENCOUNTER — Other Ambulatory Visit: Payer: Self-pay | Admitting: Pharmacist

## 2014-03-21 DIAGNOSIS — R350 Frequency of micturition: Secondary | ICD-10-CM

## 2014-03-21 DIAGNOSIS — E119 Type 2 diabetes mellitus without complications: Secondary | ICD-10-CM | POA: Insufficient documentation

## 2014-03-21 MED ORDER — METFORMIN HCL ER 500 MG PO TB24: 500.0000 mg | ORAL_TABLET | Freq: Every day | ORAL | Status: DC

## 2014-03-21 NOTE — Progress Notes (Signed)
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  Checked urine dip and was found to have glucosuria.  RBG was 270's. Patient has eaten 3-4 chocolate cookies BG was checked again about 2-3 hours later and was 170's (this was about 30 minutes after lunch of chili) A1c = 7.8% Patient given glucometer to check BG at home this weekend.  Rx for metformin XR 500mg  1 tablet daily sent to pharmacy.  Appt made to discuss BG for Monday 03/24/14  Cherre Robins, PharmD, CPP

## 2014-03-24 ENCOUNTER — Ambulatory Visit (INDEPENDENT_AMBULATORY_CARE_PROVIDER_SITE_OTHER): Payer: 59 | Admitting: Pharmacist

## 2014-03-24 VITALS — BP 132/90 | HR 68 | Ht 61.0 in | Wt 201.8 lb

## 2014-03-24 DIAGNOSIS — E119 Type 2 diabetes mellitus without complications: Secondary | ICD-10-CM

## 2014-03-24 MED ORDER — LISINOPRIL 10 MG PO TABS: 10.0000 mg | ORAL_TABLET | Freq: Every day | ORAL | Status: DC

## 2014-03-24 NOTE — Patient Instructions (Signed)
Diabetes and Standards of Medical Care   Diabetes is complicated. You may find that your diabetes team includes a dietitian, nurse, diabetes educator, eye doctor, and more. To help everyone know what is going on and to help you get the care you deserve, the following schedule of care was developed to help keep you on track. Below are the tests, exams, vaccines, medicines, education, and plans you will need.  Blood Glucose Goals Prior to meals = 80 - 130 Within 2 hours of the start of a meal = less than 180  HbA1c test (goal is less than 7.0% - your last value was %) This test shows how well you have controlled your glucose over the past 2 to 3 months. It is used to see if your diabetes management plan needs to be adjusted.   It is performed at least 2 times a year if you are meeting treatment goals.  It is performed 4 times a year if therapy has changed or if you are not meeting treatment goals.  Blood pressure test  This test is performed at every routine medical visit. The goal is less than 140/90 mmHg for most people, but 130/80 mmHg in some cases. Ask your health care provider about your goal.  Dental exam  Follow up with the dentist regularly.  Eye exam  If you are diagnosed with type 1 diabetes as a child, get an exam upon reaching the age of 10 years or older and have had diabetes for 3 to 5 years. Yearly eye exams are recommended after that initial eye exam.  If you are diagnosed with type 1 diabetes as an adult, get an exam within 5 years of diagnosis and then yearly.  If you are diagnosed with type 2 diabetes, get an exam as soon as possible after the diagnosis and then yearly.  Foot care exam  Visual foot exams are performed at every routine medical visit. The exams check for cuts, injuries, or other problems with the feet.  A comprehensive foot exam should be done yearly. This includes visual inspection as well as assessing foot pulses and testing for loss of  sensation.  Check your feet nightly for cuts, injuries, or other problems with your feet. Tell your health care provider if anything is not healing.  Kidney function test (urine microalbumin)  This test is performed once a year.  Type 1 diabetes: The first test is performed 5 years after diagnosis.  Type 2 diabetes: The first test is performed at the time of diagnosis.  A serum creatinine and estimated glomerular filtration rate (eGFR) test is done once a year to assess the level of chronic kidney disease (CKD), if present.  Lipid profile (cholesterol, HDL, LDL, triglycerides)  Performed every 5 years for most people.  The goal for LDL is less than 100 mg/dL. If you are at high risk, the goal is less than 70 mg/dL.  The goal for HDL is 40 mg/dL to 50 mg/dL for men and 50 mg/dL to 60 mg/dL for women. An HDL cholesterol of 60 mg/dL or higher gives some protection against heart disease.  The goal for triglycerides is less than 150 mg/dL.  Influenza vaccine, pneumococcal vaccine, and hepatitis B vaccine  The influenza vaccine is recommended yearly.  The pneumococcal vaccine is generally given once in a lifetime. However, there are some instances when another vaccination is recommended. Check with your health care provider.  The hepatitis B vaccine is also recommended for adults with diabetes.    Diabetes self-management education  Education is recommended at diagnosis and ongoing as needed.  Treatment plan  Your treatment plan is reviewed at every medical visit.  Document Released: 01/30/2009 Document Revised: 12/05/2012 Document Reviewed: 09/04/2012 ExitCare Patient Information 2014 [employer].   

## 2014-03-24 NOTE — Progress Notes (Signed)
Subjective:    [name] is a 48 y.o. female who presents for an initial evaluation of Type 2 diabetes mellitus.  She was just diagnosed with type 2 diabetes based on A1c of 7.8% and RBG of 270.  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  Urine dip was checked and found to have positive glucose present in urine and trace amount of blood.  All other values were WNL.  Current symptoms/problems include hyperglycemia, visual disturbances and lower abdominal pain (which resolved Friday 12/3)  She states that before RBG was checked last Friday, she has eaten 4 chocolate / "cow pattie" cookies.   No history of gestational diabetes Positive family history of DM - father (tyep 2) and paternal uncle  Known diabetic complications: none Cardiovascular risk factors: advanced age (older than 60 for men, 70 for women), diabetes mellitus, obesity (BMI >= 30 kg/m2), sedentary lifestyle and smoking/ tobacco exposure Current diabetic medications include metformin XR 500mg  qd - started Friday 03/21/14.   Eye exam current (within one year): no Weight trend: decreasing steadily - has lost 3# since 03/21/2014 Prior visit with dietician: no Current diet: in general, an "unhealthy" diet Current exercise: none  Current monitoring regimen: started check 1-2 timer daily 3 days ago Home blood sugar records: reading over the weekend were 190's  Any episodes of hypoglycemia? no  Is She on ACE inhibitor or angiotensin II receptor blocker?  No       The following portions of the patient's history were reviewed and updated as appropriate: allergies, current medications, past family history, past medical history, past social history, past surgical history and problem list.  Review of Systems Constitutional: positive for fatigue Eyes: positive for visual disturbance and changes in close vision Endocrine: positive for diabetic symptoms including blurry vision and increased fatigue     Objective:    BP 132/90 mmHg  Pulse 68  Ht 5\' 1"  (1.549 m)  Wt 201 lb 12 oz (91.513 kg)  BMI 38.14 kg/m2  General:  alert, cooperative, fatigued and mild distress    Lab Review GLUCOSE (mg/dL)  Date Value  12/11/2012 118*   GLUCOSE, BLD (mg/dL)  Date Value  11/09/2013 133*  09/18/2012 122*  09/06/2010 163*   CO2  Date Value  11/09/2013 24 mEq/L  12/11/2012 22 mmol/L  09/18/2012 26 mEq/L   BUN (mg/dL)  Date Value  11/09/2013 10  12/11/2012 12  09/18/2012 11  09/06/2010 12   CREAT (mg/dL)  Date Value  09/18/2012 0.62   CREATININE, SER (mg/dL)  Date Value  11/09/2013 0.59  12/11/2012 0.63  09/06/2010 0.72     A1c per our lab was 7.8% (03/22/2014) but value has not been resulted yet.  Assessment:    Diabetes Mellitus type II, under inadequate control History of elevated HR - patient has been taking bystolic 5mg  1/2 tablet daily Kidney protection  - recommend ACE or ARB due to type 2 DM.    Plan:    1.  Rx changes: continue metformin XR 500mg  1 tablet daily with food.    Start lisinopril 10mg  1 tablet daily.   Will hold bystolic 5mg  1/2 tablet - but will need to monitor HR and this was origianlly started doe to elevated HR.   2.  Education: Reviewed 'ABCs' of diabetes management (respective goals in parentheses):  A1C (<7), blood pressure (<130/80), and cholesterol (LDL <100).  Patient is very motivated to make TLC changes to control BG. 3.  Discussed CHO counting diet in depth.  Recommended 45 to 50 grams per meal and 15 to 20 gram per snack.  Reviewed portion sizes.  Recommended increase lean proteins and low CHO containing vegetables.  Discontinue regular sodas and sweet tea.  4.  Encouraged exercise - start with 5-10 minutes daily and work up to 30 minutes.  5.  Discussed BG goals/A1c and lipids goals.  Need to check lipids but will hold off until 1-2 months from now due to current dietary changes.  Also recommend check urine microalbumin in 1-2  months. 4. Follow up: 1 month     Cherre Robins, PharmD, CPP, CDE

## 2014-04-09 ENCOUNTER — Other Ambulatory Visit: Payer: Self-pay | Admitting: *Deleted

## 2014-04-09 MED ORDER — DOXYCYCLINE HYCLATE 100 MG PO TABS: 100.0000 mg | ORAL_TABLET | Freq: Two times a day (BID) | ORAL | Status: DC

## 2014-05-07 ENCOUNTER — Other Ambulatory Visit: Payer: Self-pay | Admitting: Pharmacist

## 2014-05-07 MED ORDER — METFORMIN HCL ER 500 MG PO TB24: 1000.0000 mg | ORAL_TABLET | Freq: Every day | ORAL | Status: DC

## 2014-05-22 LAB — HM DIABETES EYE EXAM

## 2014-06-03 ENCOUNTER — Other Ambulatory Visit: Payer: Self-pay

## 2014-06-03 MED ORDER — ALPRAZOLAM 0.5 MG PO TABS: 0.5000 mg | ORAL_TABLET | Freq: Two times a day (BID) | ORAL | Status: DC | PRN

## 2014-06-03 NOTE — Telephone Encounter (Signed)
Last filled 06/2012

## 2014-06-03 NOTE — Telephone Encounter (Signed)
Please call in xanax with 1 refills 

## 2014-06-04 NOTE — Telephone Encounter (Signed)
Called to pharmacy 

## 2014-06-23 ENCOUNTER — Encounter: Payer: Self-pay | Admitting: *Deleted

## 2014-07-03 ENCOUNTER — Ambulatory Visit (INDEPENDENT_AMBULATORY_CARE_PROVIDER_SITE_OTHER): Payer: 59 | Admitting: Family Medicine

## 2014-07-03 ENCOUNTER — Encounter: Payer: Self-pay | Admitting: Family Medicine

## 2014-07-03 VITALS — BP 134/79 | HR 71 | Temp 96.7°F

## 2014-07-03 DIAGNOSIS — M6530 Trigger finger, unspecified finger: Secondary | ICD-10-CM | POA: Diagnosis not present

## 2014-07-03 NOTE — Progress Notes (Signed)
   Subjective:    Patient ID: [name], female    DOB: [birth year], 49 y.o.   MRN: [id number]  HPI  49 year old female with trigger finger of the left thumb. It seems to becoming more symptomatic with pain. It is usual in that symptoms are worse upon arising in the morning and improves with activity and motion throughout the day, but she would like to have the problem resolved.  Patient Active Problem List   Diagnosis Date Noted  . Type 2 diabetes mellitus 03/21/2014  . Abnormal transaminases 12/12/2012   Outpatient Encounter Prescriptions as of 07/03/2014  Medication Sig  . ALPRAZolam (XANAX) 0.5 MG tablet Take 1 tablet (0.5 mg total) by mouth 2 (two) times daily as needed for anxiety.  Marland Kitchen lisinopril (PRINIVIL,ZESTRIL) 10 MG tablet Take 1 tablet (10 mg total) by mouth daily.  . metFORMIN (GLUCOPHAGE XR) 500 MG 24 hr tablet Take 2 tablets (1,000 mg total) by mouth daily. With a meal.  . Multiple Vitamins-Minerals (CENTRUM ADULTS) TABS Take 1 tablet by mouth daily.  . nebivolol (BYSTOLIC) 5 MG tablet Take 0.5 tablets (2.5 mg total) by mouth daily.  . pantoprazole (PROTONIX) 40 MG tablet Take 1 tablet (40 mg total) by mouth daily.  . sertraline (ZOLOFT) 100 MG tablet Take 1 tablet (100 mg total) by mouth daily.  . hydrochlorothiazide (HYDRODIURIL) 25 MG tablet Take 1 tablet (25 mg total) by mouth daily. (Patient not taking: Reported on 03/24/2014)  . [DISCONTINUED] doxycycline (VIBRA-TABS) 100 MG tablet Take 1 tablet (100 mg total) by mouth 2 (two) times daily.     Review of Systems  Constitutional: Negative.   HENT: Negative.   Eyes: Negative.   Respiratory: Negative.   Cardiovascular: Negative.   Gastrointestinal: Negative.   Endocrine: Negative.   Genitourinary: Negative.   Musculoskeletal: Positive for arthralgias.  Hematological: Negative.   Psychiatric/Behavioral: Negative.        Objective:   Physical Exam  Musculoskeletal:  Thickening at the distal flexor crease  in the hand was palpated in this area was injected with 1/2 mL of Kenalog. Patient tolerated the procedure. We'll follow progression of symptoms as she is here in the office.     BP 134/79 mmHg  Pulse 71  Temp(Src) 96.7 F (35.9 C) (Oral)  Wt       Assessment & Plan:  1. Trigger finger, acquired Treated with injection as described above.  Wardell Honour MD

## 2014-07-08 ENCOUNTER — Ambulatory Visit (INDEPENDENT_AMBULATORY_CARE_PROVIDER_SITE_OTHER): Payer: 59 | Admitting: Physician Assistant

## 2014-07-08 ENCOUNTER — Encounter: Payer: Self-pay | Admitting: Physician Assistant

## 2014-07-08 VITALS — BP 138/88 | HR 90 | Temp 97.0°F | Ht 61.0 in

## 2014-07-08 DIAGNOSIS — L918 Other hypertrophic disorders of the skin: Secondary | ICD-10-CM

## 2014-07-08 NOTE — Progress Notes (Signed)
   Subjective:    Patient ID: [name], female    DOB: [birth year], 49 y.o.   MRN: [id number]  HPI 49 y/o female presents with c/o skin tags that are enlarging and irritated by clothing.    Review of Systems  Skin:       New enlarging lesion s on neck, flesh color        Objective:   Physical Exam  Skin:  Hyperkeratotic lesions on neck resemble benign skin tags          Assessment & Plan:  1. Skin tags: Lesions treated with cryosurgery x 5 . Apply vaseline if needed.

## 2014-08-18 ENCOUNTER — Other Ambulatory Visit: Payer: Self-pay

## 2014-08-18 MED ORDER — PANTOPRAZOLE SODIUM 40 MG PO TBEC: 40.0000 mg | DELAYED_RELEASE_TABLET | Freq: Every day | ORAL | Status: DC

## 2014-09-03 ENCOUNTER — Other Ambulatory Visit: Payer: Self-pay

## 2014-09-03 MED ORDER — SERTRALINE HCL 100 MG PO TABS: 100.0000 mg | ORAL_TABLET | Freq: Every day | ORAL | Status: DC

## 2014-09-03 MED ORDER — LISINOPRIL 10 MG PO TABS: 10.0000 mg | ORAL_TABLET | Freq: Every day | ORAL | Status: DC

## 2014-10-27 ENCOUNTER — Other Ambulatory Visit: Payer: Self-pay | Admitting: *Deleted

## 2014-10-27 DIAGNOSIS — E119 Type 2 diabetes mellitus without complications: Secondary | ICD-10-CM

## 2014-10-31 ENCOUNTER — Other Ambulatory Visit (INDEPENDENT_AMBULATORY_CARE_PROVIDER_SITE_OTHER): Payer: 59

## 2014-10-31 ENCOUNTER — Other Ambulatory Visit: Payer: Self-pay | Admitting: *Deleted

## 2014-10-31 DIAGNOSIS — E119 Type 2 diabetes mellitus without complications: Secondary | ICD-10-CM | POA: Diagnosis not present

## 2014-10-31 DIAGNOSIS — R5383 Other fatigue: Secondary | ICD-10-CM

## 2014-10-31 LAB — POCT CBC
Granulocyte percent: 79.2 %G (ref 37–80)
HEMATOCRIT: 45.7 % (ref 37.7–47.9)
Hemoglobin: 15.9 g/dL (ref 12.2–16.2)
Lymph, poc: 2.2 (ref 0.6–3.4)
MCH, POC: 31.3 pg — AB (ref 27–31.2)
MCHC: 34.9 g/dL (ref 31.8–35.4)
MCV: 89.7 fL (ref 80–97)
MPV: 7.6 fL (ref 0–99.8)
POC Granulocyte: 9.3 — AB (ref 2–6.9)
POC LYMPH PERCENT: 18.5 %L (ref 10–50)
Platelet Count, POC: 353 10*3/uL (ref 142–424)
RBC: 5.09 M/uL (ref 4.04–5.48)
RDW, POC: 12 %
WBC: 11.8 10*3/uL — AB (ref 4.6–10.2)

## 2014-10-31 LAB — POCT GLYCOSYLATED HEMOGLOBIN (HGB A1C): Hemoglobin A1C: 6.1

## 2014-11-01 LAB — LIPID PANEL
Chol/HDL Ratio: 4.6 ratio units — ABNORMAL HIGH (ref 0.0–4.4)
Cholesterol, Total: 235 mg/dL — ABNORMAL HIGH (ref 100–199)
HDL: 51 mg/dL (ref >39)
LDL Calculated: 143 mg/dL — ABNORMAL HIGH (ref 0–99)
Triglycerides: 207 mg/dL — ABNORMAL HIGH (ref 0–149)
VLDL Cholesterol Cal: 41 mg/dL — ABNORMAL HIGH (ref 5–40)

## 2014-11-01 LAB — VITAMIN D 25 HYDROXY (VIT D DEFICIENCY, FRACTURES): Vit D, 25-Hydroxy: 27.3 ng/mL — ABNORMAL LOW (ref 30.0–100.0)

## 2014-11-01 LAB — CMP14+EGFR
A/G RATIO: 1.6 (ref 1.1–2.5)
ALK PHOS: 74 IU/L (ref 39–117)
ALT: 28 IU/L (ref 0–32)
AST: 22 IU/L (ref 0–40)
Albumin: 4.3 g/dL (ref 3.5–5.5)
BUN / CREAT RATIO: 23 (ref 9–23)
BUN: 14 mg/dL (ref 6–24)
Bilirubin Total: 0.5 mg/dL (ref 0.0–1.2)
CHLORIDE: 98 mmol/L (ref 97–108)
CO2: 23 mmol/L (ref 18–29)
Calcium: 10.1 mg/dL (ref 8.7–10.2)
Creatinine, Ser: 0.6 mg/dL (ref 0.57–1.00)
GFR calc non Af Amer: 109 mL/min/{1.73_m2} (ref >59)
GFR, EST AFRICAN AMERICAN: 126 mL/min/{1.73_m2} (ref >59)
GLOBULIN, TOTAL: 2.7 g/dL (ref 1.5–4.5)
Glucose: 120 mg/dL — ABNORMAL HIGH (ref 65–99)
POTASSIUM: 4.4 mmol/L (ref 3.5–5.2)
Sodium: 139 mmol/L (ref 134–144)
Total Protein: 7 g/dL (ref 6.0–8.5)

## 2014-11-01 LAB — VITAMIN B12: Vitamin B-12: 456 pg/mL (ref 211–946)

## 2014-11-04 ENCOUNTER — Ambulatory Visit (INDEPENDENT_AMBULATORY_CARE_PROVIDER_SITE_OTHER): Payer: 59

## 2014-11-04 ENCOUNTER — Encounter: Payer: Self-pay | Admitting: Nurse Practitioner

## 2014-11-04 ENCOUNTER — Ambulatory Visit (INDEPENDENT_AMBULATORY_CARE_PROVIDER_SITE_OTHER): Payer: 59 | Admitting: Nurse Practitioner

## 2014-11-04 VITALS — BP 139/95 | HR 90 | Temp 97.3°F | Ht 61.0 in | Wt 185.0 lb

## 2014-11-04 DIAGNOSIS — Z6834 Body mass index (BMI) 34.0-34.9, adult: Secondary | ICD-10-CM | POA: Insufficient documentation

## 2014-11-04 DIAGNOSIS — Z0000 Encounter for general adult medical examination without abnormal findings: Secondary | ICD-10-CM

## 2014-11-04 DIAGNOSIS — E1159 Type 2 diabetes mellitus with other circulatory complications: Secondary | ICD-10-CM | POA: Insufficient documentation

## 2014-11-04 DIAGNOSIS — F411 Generalized anxiety disorder: Secondary | ICD-10-CM | POA: Insufficient documentation

## 2014-11-04 DIAGNOSIS — I10 Essential (primary) hypertension: Secondary | ICD-10-CM

## 2014-11-04 DIAGNOSIS — E119 Type 2 diabetes mellitus without complications: Secondary | ICD-10-CM

## 2014-11-04 DIAGNOSIS — F329 Major depressive disorder, single episode, unspecified: Secondary | ICD-10-CM | POA: Insufficient documentation

## 2014-11-04 DIAGNOSIS — F32A Depression, unspecified: Secondary | ICD-10-CM | POA: Insufficient documentation

## 2014-11-04 DIAGNOSIS — I152 Hypertension secondary to endocrine disorders: Secondary | ICD-10-CM | POA: Insufficient documentation

## 2014-11-04 DIAGNOSIS — K219 Gastro-esophageal reflux disease without esophagitis: Secondary | ICD-10-CM | POA: Insufficient documentation

## 2014-11-04 MED ORDER — METFORMIN HCL ER 500 MG PO TB24: 1000.0000 mg | ORAL_TABLET | Freq: Every day | ORAL | Status: DC

## 2014-11-04 MED ORDER — SERTRALINE HCL 100 MG PO TABS: 100.0000 mg | ORAL_TABLET | Freq: Every day | ORAL | Status: DC

## 2014-11-04 MED ORDER — LISINOPRIL 20 MG PO TABS: 20.0000 mg | ORAL_TABLET | Freq: Every day | ORAL | Status: DC

## 2014-11-04 NOTE — Progress Notes (Addendum)
Subjective:    Patient ID: [name], female    DOB: [birth year], 49 y.o.   MRN: [id number] Patient here today for annual physical exam. Her husband died recently and she wants to make sure that she is okay. She is doing well. Still tearful at times. Current medical problems:  Diabetes She presents for her follow-up diabetic visit. She has type 2 diabetes mellitus. The initial diagnosis of diabetes was made 10 months ago. Her disease course has been fluctuating. There are no hypoglycemic associated symptoms. Associated symptoms include visual change. Pertinent negatives for diabetes include no chest pain, no fatigue, no foot paresthesias, no foot ulcerations, no polydipsia, no polyphagia, no polyuria and no weight loss. Symptoms are stable. Pertinent negatives for diabetic complications include no CVA, heart disease, nephropathy or retinopathy. Risk factors for coronary artery disease include dyslipidemia and obesity. Current diabetic treatment includes oral agent (monotherapy). Her weight is stable. Diabetic current diet: was on weight watchers before husband died . going to start back this week. When asked about meal planning, she reported none. She has not had a previous visit with a dietitian. She participates in exercise weekly. Her breakfast blood glucose is taken between 9-10 am. Her breakfast blood glucose range is generally 130-140 mg/dl. Her dinner blood glucose range is generally 130-140 mg/dl. Her highest blood glucose is >200 mg/dl. An ACE inhibitor/angiotensin II receptor blocker is being taken. She does not see a podiatrist.Eye exam is not current.  Hypertension This is a chronic problem. The current episode started more than 1 year ago. The problem is unchanged. The problem is controlled. Pertinent negatives include no chest pain. Risk factors for coronary artery disease include dyslipidemia, diabetes mellitus, obesity and post-menopausal state. Past treatments include ACE inhibitors.  The current treatment provides moderate improvement. Compliance problems include diet.  There is no history of CAD/MI, CVA or retinopathy.   Depression/anxiety Currently on zoloft 100mg  daily and xanax as needed- doing quite well even though husband past away. She has  No side effects form meds. GERD protonix daily helps keepp symptoms under control   Review of Systems  Constitutional: Negative for weight loss and fatigue.  Respiratory: Negative.   Cardiovascular: Negative.  Negative for chest pain.  Endocrine: Negative for polydipsia, polyphagia and polyuria.  Genitourinary: Negative.   Neurological: Negative.   Psychiatric/Behavioral: Negative.   All other systems reviewed and are negative.      Objective:   Physical Exam  Constitutional: She is oriented to person, place, and time. She appears well-developed and well-nourished.  HENT:  Head: Normocephalic.  Right Ear: Hearing, tympanic membrane, external ear and ear canal normal.  Left Ear: Hearing, tympanic membrane, external ear and ear canal normal.  Nose: Nose normal.  Mouth/Throat: Uvula is midline, oropharynx is clear and moist and mucous membranes are normal.  Eyes: Conjunctivae and EOM are normal. Pupils are equal, round, and reactive to light.  Neck: Normal range of motion. Neck supple. No JVD present. No thyromegaly present.  Cardiovascular: Normal rate, normal heart sounds and intact distal pulses.   No murmur heard. Pulmonary/Chest: Effort normal and breath sounds normal. She has no wheezes. She has no rales.  Abdominal: Soft. Bowel sounds are normal. She exhibits no mass.  Musculoskeletal: Normal range of motion.  Neurological: She is alert and oriented to person, place, and time. She has normal reflexes.  Skin: Skin is warm and dry.  Psychiatric: She has a normal mood and affect. Her behavior is normal. Judgment and thought  content normal.   BP 139/95 mmHg  Pulse 90  Temp(Src) 97.3 F (36.3 C) (Oral)  Ht 5'  1" (1.549 m)  Wt 185 lb (83.915 kg)  BMI 34.97 kg/m2  EKG- NSR_[name], [name]  Chest xray- normal-Preliminary reading by [name], [name]  [facility]     Assessment & Plan:  1. Annual physical exam - DG Chest 2 View; Future - EKG 12-Lead  2. Essential hypertension Dash diet Increased lisinopril from 10mg  to 20mg  daily - lisinopril (PRINIVIL,ZESTRIL) 20 MG tablet; Take 1 tablet (20 mg total) by mouth daily.  Dispense: 90 tablet; Refill: 3  3. Depression Stress management - sertraline (ZOLOFT) 100 MG tablet; Take 1 tablet (100 mg total) by mouth daily.  Dispense: 90 tablet; Refill: 0  4. Generalized anxiety disorder  5. Gastroesophageal reflux disease without esophagitis Avoid spicy foods Do not eat 2 hours prior to bedtime   6. BMI 34.0-34.9,adult Discussed diet and exercise for person with BMI >25 Will recheck weight in 3-6 months   7. Type 2 diabetes mellitus without complication Stricter low carb diet - metFORMIN (GLUCOPHAGE XR) 500 MG 24 hr tablet; Take 2 tablets (1,000 mg total) by mouth daily. With a meal.  Dispense: 180 tablet; Refill: 1    Labs discussed at appointment Health maintenance reviewed Diet and exercise encouraged Continue all meds Follow up  In 3 months   [place], [name]

## 2014-11-04 NOTE — Patient Instructions (Signed)
Exercise to Stay Healthy Exercise helps you become and stay healthy. EXERCISE IDEAS AND TIPS Choose exercises that:  You enjoy.  Fit into your day. You do not need to exercise really hard to be healthy. You can do exercises at a slow or medium level and stay healthy. You can:  Stretch before and after working out.  Try yoga, Pilates, or tai chi.  Lift weights.  Walk fast, swim, jog, run, climb stairs, bicycle, dance, or rollerskate.  Take aerobic classes. Exercises that burn about 150 calories:  Running 1  miles in 15 minutes.  Playing volleyball for 45 to 60 minutes.  Washing and waxing a car for 45 to 60 minutes.  Playing touch football for 45 minutes.  Walking 1  miles in 35 minutes.  Pushing a stroller 1  miles in 30 minutes.  Playing basketball for 30 minutes.  Raking leaves for 30 minutes.  Bicycling 5 miles in 30 minutes.  Walking 2 miles in 30 minutes.  Dancing for 30 minutes.  Shoveling snow for 15 minutes.  Swimming laps for 20 minutes.  Walking up stairs for 15 minutes.  Bicycling 4 miles in 15 minutes.  Gardening for 30 to 45 minutes.  Jumping rope for 15 minutes.  Washing windows or floors for 45 to 60 minutes. Document Released: 05/07/2010 Document Revised: 06/27/2011 Document Reviewed: 05/07/2010 Exit[employer] Patient Information 2015 [employer]. This information is not intended to replace advice given to you by your health care provider. Make sure you discuss any questions you have with your health care provider.  

## 2014-11-20 ENCOUNTER — Other Ambulatory Visit: Payer: Self-pay

## 2014-11-20 DIAGNOSIS — Z1231 Encounter for screening mammogram for malignant neoplasm of breast: Secondary | ICD-10-CM

## 2014-11-26 ENCOUNTER — Other Ambulatory Visit: Payer: Self-pay | Admitting: Family

## 2014-11-26 ENCOUNTER — Other Ambulatory Visit (INDEPENDENT_AMBULATORY_CARE_PROVIDER_SITE_OTHER): Payer: 59

## 2014-11-26 DIAGNOSIS — Z780 Asymptomatic menopausal state: Secondary | ICD-10-CM

## 2014-11-28 ENCOUNTER — Ambulatory Visit
Admission: RE | Admit: 2014-11-28 | Discharge: 2014-11-28 | Disposition: A | Discharge status: Home / Self Care | Payer: 59 | Source: Ambulatory Visit

## 2014-11-28 DIAGNOSIS — Z1231 Encounter for screening mammogram for malignant neoplasm of breast: Secondary | ICD-10-CM

## 2015-01-06 ENCOUNTER — Other Ambulatory Visit: Payer: Self-pay | Admitting: Nurse Practitioner

## 2015-01-06 MED ORDER — DOXYCYCLINE HYCLATE 100 MG PO TABS: 100.0000 mg | ORAL_TABLET | Freq: Two times a day (BID) | ORAL | Status: DC

## 2015-01-27 ENCOUNTER — Other Ambulatory Visit: Payer: Self-pay | Admitting: Nurse Practitioner

## 2015-01-27 ENCOUNTER — Other Ambulatory Visit: Payer: 59

## 2015-01-27 DIAGNOSIS — Z91018 Allergy to other foods: Secondary | ICD-10-CM

## 2015-01-27 DIAGNOSIS — R5383 Other fatigue: Secondary | ICD-10-CM

## 2015-01-30 LAB — ALPHA-GAL PANEL
Alpha Gal IgE*: <0.1 kU/L (ref <0.35)
Class Interpretation: 0
Class Interpretation: 0
LAMB CLASS INTERPRETATION: 0
Pork (Sus spp) IgE: <0.1 kU/L (ref <0.35)

## 2015-01-30 LAB — VITAMIN B12: VITAMIN B 12: 691 pg/mL (ref 211–946)

## 2015-02-04 ENCOUNTER — Other Ambulatory Visit: Payer: Self-pay

## 2015-02-04 ENCOUNTER — Ambulatory Visit (INDEPENDENT_AMBULATORY_CARE_PROVIDER_SITE_OTHER): Payer: 59

## 2015-02-04 VITALS — BP 128/81

## 2015-02-04 DIAGNOSIS — F32A Depression, unspecified: Secondary | ICD-10-CM

## 2015-02-04 DIAGNOSIS — I10 Essential (primary) hypertension: Secondary | ICD-10-CM

## 2015-02-04 DIAGNOSIS — F329 Major depressive disorder, single episode, unspecified: Secondary | ICD-10-CM

## 2015-02-04 MED ORDER — SERTRALINE HCL 100 MG PO TABS: 100.0000 mg | ORAL_TABLET | Freq: Every day | ORAL | Status: DC

## 2015-02-04 MED ORDER — PANTOPRAZOLE SODIUM 40 MG PO TBEC: 40.0000 mg | DELAYED_RELEASE_TABLET | Freq: Every day | ORAL | Status: DC

## 2015-02-04 NOTE — Progress Notes (Signed)
Patient ID: [name], female   DOB: Aug 17, 1965, 49 y.o.   MRN: [id number]   Patient returns to office today for a recheck of BP. She has a history of hypertension. BP today was 128/81.

## 2015-03-09 ENCOUNTER — Encounter: Payer: Self-pay | Admitting: Family Medicine

## 2015-03-09 ENCOUNTER — Ambulatory Visit (INDEPENDENT_AMBULATORY_CARE_PROVIDER_SITE_OTHER): Payer: 59 | Admitting: Family Medicine

## 2015-03-09 VITALS — BP 136/85 | HR 90 | Temp 97.1°F | Ht 61.0 in | Wt 185.0 lb

## 2015-03-09 DIAGNOSIS — J0121 Acute recurrent ethmoidal sinusitis: Secondary | ICD-10-CM | POA: Diagnosis not present

## 2015-03-09 MED ORDER — DOXYCYCLINE HYCLATE 100 MG PO TABS: ORAL_TABLET | ORAL | Status: DC

## 2015-03-09 NOTE — Patient Instructions (Signed)
Drink plenty of fluids and keep well hydrated Use nasal saline frequently in each nostril during the day and continue Nasonex 1-2 sprays each nostril at bedtime Take Mucinex, blue and white in color maximum strength, 1 twice daily with a large glass of water if needed for cough and congestion Take Benadryl as needed for allergy in addition to the Nasonex

## 2015-03-09 NOTE — Progress Notes (Signed)
Subjective:    Patient ID: [name], female    DOB: 09/24/1965, 49 y.o.   MRN: [id number]  HPI Patient here today for sinus pressure and dizziness that started about 5 days ago. This is been going on for 45 days and also just some slight lightheadedness. There is been minimal cough. Most of the discomfort is in the teeth in the front and in the ethmoid SINUS area between the eyes.       . Patient Active Problem List   Diagnosis Date Noted  . Gastroesophageal reflux disease without esophagitis 11/04/2014  . Essential hypertension 11/04/2014  . Depression 11/04/2014  . Generalized anxiety disorder 11/04/2014  . Annual physical exam 11/04/2014  . BMI 34.0-34.9,adult 11/04/2014  . Type 2 diabetes mellitus ([place]) 03/21/2014  . Abnormal transaminases 12/12/2012   Outpatient Encounter Prescriptions as of 03/09/2015  Medication Sig  . ALPRAZolam (XANAX) 0.5 MG tablet Take 1 tablet (0.5 mg total) by mouth 2 (two) times daily as needed for anxiety.  Marland Kitchen lisinopril (PRINIVIL,ZESTRIL) 20 MG tablet Take 1 tablet (20 mg total) by mouth daily.  . metFORMIN (GLUCOPHAGE XR) 500 MG 24 hr tablet Take 2 tablets (1,000 mg total) by mouth daily. With a meal.  . pantoprazole (PROTONIX) 40 MG tablet Take 1 tablet (40 mg total) by mouth daily.  . sertraline (ZOLOFT) 100 MG tablet Take 1 tablet (100 mg total) by mouth daily.  . [DISCONTINUED] doxycycline (VIBRA-TABS) 100 MG tablet Take 1 tablet (100 mg total) by mouth 2 (two) times daily. 1 po bid   No facility-administered encounter medications on file as of 03/09/2015.      Review of Systems  Constitutional: Negative.   HENT: Positive for sinus pressure. Negative for congestion.   Eyes: Negative.   Respiratory: Negative.  Negative for cough.   Cardiovascular: Negative.   Gastrointestinal: Negative.   Endocrine: Negative.   Genitourinary: Negative.   Musculoskeletal: Negative.   Skin: Negative.   Allergic/Immunologic: Negative.     Neurological: Positive for light-headedness.  Hematological: Negative.   Psychiatric/Behavioral: Negative.        Objective:   Physical Exam  Constitutional: She is oriented to person, place, and time. She appears well-developed and well-nourished.  HENT:  Head: Normocephalic and atraumatic.  Right Ear: External ear normal.  Left Ear: External ear normal.  Mouth/Throat: Oropharynx is clear and moist.  Ethmoid sinus tenderness  Eyes: Conjunctivae and EOM are normal. Pupils are equal, round, and reactive to light. Right eye exhibits no discharge. Left eye exhibits no discharge. No scleral icterus.  Neck: Normal range of motion. Neck supple. No thyromegaly present.  No anterior cervical adenopathy  Cardiovascular: Normal rate, regular rhythm and normal heart sounds.   No murmur heard. Pulmonary/Chest: Effort normal and breath sounds normal. No respiratory distress. She has no wheezes. She has no rales. She exhibits no tenderness.  Clear anteriorly and posteriorly  Musculoskeletal: Normal range of motion. She exhibits no edema.  Lymphadenopathy:    She has no cervical adenopathy.  Neurological: She is alert and oriented to person, place, and time.  Skin: Skin is warm and dry. No rash noted.  Psychiatric: She has a normal mood and affect. Her behavior is normal. Judgment and thought content normal.  Nursing note and vitals reviewed.  BP 129/92 mmHg  Pulse 90  Temp(Src) 97.1 F (36.2 C) (Oral)  Ht 5\' 1"  (1.549 m)  Wt 185 lb (83.915 kg)  BMI 34.97 kg/m2  Repeat blood pressure 136/85  Assessment & Plan:  1. Acute recurrent ethmoidal sinusitis -Drink plenty of fluids and stay well hydrated and use nasal saline frequently the in each nostril through the day and take Mucinex if needed for cough and congestion -Continue with Nasonex - doxycycline (VIBRA-TABS) 100 MG tablet; Take one twice daily with food until completed  Dispense: 20 tablet; Refill: 0  Patient Instructions   Drink plenty of fluids and keep well hydrated Use nasal saline frequently in each nostril during the day and continue Nasonex 1-2 sprays each nostril at bedtime Take Mucinex, blue and white in color maximum strength, 1 twice daily with a large glass of water if needed for cough and congestion Take Benadryl as needed for allergy in addition to the Nasonex   Arrie Senate MD

## 2015-03-19 ENCOUNTER — Ambulatory Visit (INDEPENDENT_AMBULATORY_CARE_PROVIDER_SITE_OTHER): Payer: 59 | Admitting: Nurse Practitioner

## 2015-03-19 ENCOUNTER — Encounter: Payer: Self-pay | Admitting: Nurse Practitioner

## 2015-03-19 VITALS — BP 116/81 | HR 92 | Temp 97.0°F | Ht 61.0 in | Wt 185.0 lb

## 2015-03-19 DIAGNOSIS — R1030 Lower abdominal pain, unspecified: Secondary | ICD-10-CM | POA: Diagnosis not present

## 2015-03-19 DIAGNOSIS — M79605 Pain in left leg: Secondary | ICD-10-CM | POA: Diagnosis not present

## 2015-03-19 DIAGNOSIS — R1032 Left lower quadrant pain: Secondary | ICD-10-CM

## 2015-03-19 MED ORDER — METHYLPREDNISOLONE ACETATE 80 MG/ML IJ SUSP
80.0000 mg | Freq: Once | INTRAMUSCULAR | Status: AC
  Administered 2015-03-19: 80 mg via INTRAMUSCULAR

## 2015-03-19 MED ORDER — PREDNISONE 20 MG PO TABS: ORAL_TABLET | ORAL | Status: DC

## 2015-03-19 MED ORDER — HYDROCODONE-ACETAMINOPHEN 5-325 MG PO TABS: 1.0000 | ORAL_TABLET | Freq: Four times a day (QID) | ORAL | Status: DC | PRN

## 2015-03-19 NOTE — Progress Notes (Signed)
   Subjective:    Patient ID: [name], female    DOB: [birth year], 49 y.o.   MRN: [id number]  HPI Patient in c/o of left groin pain that radiates down to knee- started yesterday. Denies any injury. Pain rated 9/10 and pain increases with walking. Sitting helps pain but still has pain around 5/10.    Review of Systems  Constitutional: Negative.   HENT: Negative.   Respiratory: Negative.   Cardiovascular: Negative.   Genitourinary: Negative.   Neurological: Negative.   Psychiatric/Behavioral: Negative.   All other systems reviewed and are negative.      Objective:   Physical Exam  Constitutional: She is oriented to person, place, and time. She appears well-developed and well-nourished.  Cardiovascular: Normal rate, regular rhythm and normal heart sounds.   2+ pedal pulse left foot 2+ femoral pulse on left  Pulmonary/Chest: Effort normal and breath sounds normal.  Musculoskeletal:  FROM of left hip without pain No point tenderness i left groin or hip. Pian in left hip area with movement of right leg  Neurological: She is alert and oriented to person, place, and time.  Skin: Skin is warm.  Psychiatric: She has a normal mood and affect. Her behavior is normal. Judgment and thought content normal.    BP 116/81 mmHg  Pulse 92  Temp(Src) 97 F (36.1 C) (Oral)  Ht 5\' 1"  (1.549 m)  Wt 185 lb (83.915 kg)  BMI 34.97 kg/m2       Assessment & Plan:  1. Left groin pain Stretching exercises RTO if not improving - methylPREDNISolone acetate (DEPO-MEDROL) injection 80 mg; Inject 1 mL (80 mg total) into the muscle once. - predniSONE (DELTASONE) 20 MG tablet; 2 po at sametime daily for 5 days- start tomorrow  Dispense: 10 tablet; Refill: 0 - HYDROcodone-acetaminophen (LORTAB) 5-325 MG tablet; Take 1 tablet by mouth every 6 (six) hours as needed for moderate pain.  Dispense: 20 tablet; Refill: 0  [name], FNP

## 2015-03-19 NOTE — Patient Instructions (Signed)
Groin Strain  A groin strain (also called a groin pull) is an injury to the muscles or tendon on the upper inner part of the thigh. These muscles are called the adductor muscles or groin muscles. They are responsible for moving the leg across the body. A muscle strain occurs when a muscle is overstretched and some muscle fibers are torn. A groin strain can range from mild to severe depending on how many muscle fibers are affected and whether the muscle fibers are partially or completely torn.   Groin strains usually occur during exercise or participation in sports. The injury often happens when a sudden, violent force is placed on a muscle, stretching the muscle too far. A strain is more likely to occur when your muscles are not warmed up or if you are not properly conditioned. Depending on the severity of the groin strain, recovery time may vary from a few weeks to several weeks. Severe injuries often require 4-6 weeks for recovery. In these cases, complete healing can take 4-5 months.   CAUSES    Stretching the groin muscles too far or too suddenly, often during side-to-side motion with an abrupt change in direction.   Putting repeated stress on the groin muscles over a long period of time.   Performing vigorous activity without properly stretching the groin muscles beforehand.  SYMPTOMS    Pain and tenderness in the groin area. This begins as sharp pain and persists as a dull ache.   Popping or snapping feeling when the injury occurs (for severe strains).   Swelling or bruising.   Muscle spasms.   Weakness in the leg.   Stiffness in the groin area with decreased ability to move the affected muscles.  DIAGNOSIS   Your caregiver will perform a physical exam to diagnose a groin strain. You will be asked about your symptoms and how the injury occurred. X-rays are sometimes needed to rule out a broken bone or cartilage problems. Your caregiver may order a CT scan or MRI if a complete muscle tear is  suspected.  TREATMENT   A groin strain will often heal on its own. Your caregiver may prescribe medicines to help manage pain and swelling (anti-inflammatory medicine). You may be told to use crutches for the first few days to minimize your pain.  HOME CARE INSTRUCTIONS    Rest. Do not use the strained muscle if it causes pain.   Put ice on the injured area.   Put ice in a plastic bag.   Place a towel between your skin and the bag.   Leave the ice on for 15-20 minutes, every 2-3 hours. Do this for the first 2 days after the injury.   Only take over-the-counter or prescription medicines as directed by your caregiver.   Wrap the injured area with an elastic bandage as directed by your caregiver.   Keep the injured leg raised (elevated).   Walk, stretch, and perform range-of-motion exercises to improve blood flow to the injured area. Only perform these activities if you can do so without any pain.  To prevent muscle strains:   Warm up before exercise.   Develop proper conditioning and strength in the groin muscles.  SEEK IMMEDIATE MEDICAL CARE IF:    You have increased pain or swelling in the affected area.    Your symptoms are not improving or are getting worse.  MAKE SURE YOU:    Understand these instructions.   Will watch your condition.   Will get help   right away if you are not doing well or get worse.     This information is not intended to replace advice given to you by your health care provider. Make sure you discuss any questions you have with your health care provider.     Document Released: 12/01/2003 Document Revised: 03/21/2012 Document Reviewed: 12/07/2011  Elsevier Interactive Patient Education 2016 [employer]

## 2015-04-27 ENCOUNTER — Other Ambulatory Visit: Payer: Self-pay

## 2015-04-27 MED ORDER — METFORMIN HCL ER 500 MG PO TB24: 1000.0000 mg | ORAL_TABLET | Freq: Every day | ORAL | Status: DC

## 2015-04-27 MED FILL — METFORMIN HCL ER 500 MG TAB: 500 | 90 days supply | Qty: 180 | Fill #0

## 2015-05-07 ENCOUNTER — Encounter: Payer: Self-pay | Admitting: Family Medicine

## 2015-05-07 ENCOUNTER — Ambulatory Visit (INDEPENDENT_AMBULATORY_CARE_PROVIDER_SITE_OTHER): Payer: 59 | Admitting: Family Medicine

## 2015-05-07 DIAGNOSIS — M6530 Trigger finger, unspecified finger: Secondary | ICD-10-CM | POA: Diagnosis not present

## 2015-05-07 NOTE — Progress Notes (Signed)
   Subjective:    Patient ID: [name], female    DOB: 09-Feb-1966, 50 y.o.   MRN: [id number]  HPI 50 year old female who has pain in her left thumb. I injected the thumb about 10 months ago for trigger finger. Now her problem is not so much the trigger finger and staffing the pain. She would like to try another injection to see if that will help. It is likely the same issue without full-blown symptomatology.  Patient Active Problem List   Diagnosis Date Noted  . Gastroesophageal reflux disease without esophagitis 11/04/2014  . Essential hypertension 11/04/2014  . Depression 11/04/2014  . Generalized anxiety disorder 11/04/2014  . Annual physical exam 11/04/2014  . BMI 34.0-34.9,adult 11/04/2014  . Type 2 diabetes mellitus ([place]) 03/21/2014  . Abnormal transaminases 12/12/2012   Outpatient Encounter Prescriptions as of 05/07/2015  Medication Sig  . ALPRAZolam (XANAX) 0.5 MG tablet Take 1 tablet (0.5 mg total) by mouth 2 (two) times daily as needed for anxiety.  Marland Kitchen lisinopril (PRINIVIL,ZESTRIL) 20 MG tablet Take 1 tablet (20 mg total) by mouth daily.  . metFORMIN (GLUCOPHAGE XR) 500 MG 24 hr tablet Take 2 tablets (1,000 mg total) by mouth daily. With a meal.  . pantoprazole (PROTONIX) 40 MG tablet Take 1 tablet (40 mg total) by mouth daily.  . sertraline (ZOLOFT) 100 MG tablet Take 1 tablet (100 mg total) by mouth daily.  . [DISCONTINUED] doxycycline (VIBRA-TABS) 100 MG tablet Take one twice daily with food until completed  . [DISCONTINUED] HYDROcodone-acetaminophen (LORTAB) 5-325 MG tablet Take 1 tablet by mouth every 6 (six) hours as needed for moderate pain.  . [DISCONTINUED] predniSONE (DELTASONE) 20 MG tablet 2 po at sametime daily for 5 days- start tomorrow   No facility-administered encounter medications on file as of 05/07/2015.      Review of Systems  Eyes: Positive for redness.  Cardiovascular: Negative.   Musculoskeletal: Positive for arthralgias.       Objective:     Physical Exam  Musculoskeletal:  The left thumb was palpated with a thickened tendon at the base of the thumb. Skin was prepped with alcohol and injected with 0.1 mL of Kenalog at the area corresponding to the thickening tendon at the proximal flexor crease. She tolerated this well.          Assessment & Plan:  1. Trigger finger, acquired Described above finger was at again injected at the proximal flexor crease. Depending on results may repeat but we got good results that last for 10 months at last visit Wardell Honour MD

## 2015-05-12 MED FILL — ESTRADIOL 0.1 MG PATCH: 0.1 | 84 days supply | Qty: 24 | Fill #1

## 2015-05-26 ENCOUNTER — Other Ambulatory Visit: Payer: Self-pay

## 2015-05-26 DIAGNOSIS — F329 Major depressive disorder, single episode, unspecified: Secondary | ICD-10-CM

## 2015-05-26 DIAGNOSIS — F32A Depression, unspecified: Secondary | ICD-10-CM

## 2015-05-26 MED ORDER — SERTRALINE HCL 100 MG PO TABS: 100.0000 mg | ORAL_TABLET | Freq: Every day | ORAL | Status: DC

## 2015-05-26 MED FILL — SERTRALINE HCL 100 MG TAB: 100 | 90 days supply | Qty: 90 | Fill #0

## 2015-06-30 ENCOUNTER — Ambulatory Visit (INDEPENDENT_AMBULATORY_CARE_PROVIDER_SITE_OTHER): Payer: 59 | Admitting: Pediatrics

## 2015-06-30 ENCOUNTER — Encounter: Payer: Self-pay | Admitting: Pediatrics

## 2015-06-30 VITALS — BP 130/80 | HR 95 | Temp 99.4°F | Ht 61.0 in | Wt 178.0 lb

## 2015-06-30 DIAGNOSIS — R6889 Other general symptoms and signs: Secondary | ICD-10-CM

## 2015-06-30 DIAGNOSIS — J069 Acute upper respiratory infection, unspecified: Secondary | ICD-10-CM

## 2015-06-30 LAB — VERITOR FLU A/B WAIVED
Influenza A: NEGATIVE
Influenza B: NEGATIVE

## 2015-07-03 ENCOUNTER — Ambulatory Visit (INDEPENDENT_AMBULATORY_CARE_PROVIDER_SITE_OTHER): Payer: 59 | Admitting: Pediatrics

## 2015-07-03 ENCOUNTER — Encounter: Payer: Self-pay | Admitting: Pediatrics

## 2015-07-03 ENCOUNTER — Ambulatory Visit (INDEPENDENT_AMBULATORY_CARE_PROVIDER_SITE_OTHER): Payer: 59

## 2015-07-03 VITALS — BP 124/85 | HR 82 | Temp 99.1°F | Ht 61.0 in | Wt 178.0 lb

## 2015-07-03 DIAGNOSIS — R6889 Other general symptoms and signs: Secondary | ICD-10-CM | POA: Diagnosis not present

## 2015-07-03 DIAGNOSIS — R05 Cough: Secondary | ICD-10-CM

## 2015-07-03 DIAGNOSIS — R059 Cough, unspecified: Secondary | ICD-10-CM

## 2015-07-03 DIAGNOSIS — R0789 Other chest pain: Secondary | ICD-10-CM | POA: Diagnosis not present

## 2015-07-03 MED ORDER — GUAIFENESIN-CODEINE 100-10 MG/5ML PO SOLN
5.0000 mL | Freq: Every evening | ORAL | Status: DC | PRN
Start: 2015-07-03 — End: 2015-07-21

## 2015-07-03 NOTE — Progress Notes (Signed)
    Subjective:    Patient ID: [name], female    DOB: 06-20-1965, 50 y.o.   MRN: [id number]  CC: flu like symptoms  HPI: [name] is a 50 y.o. female presenting for flu like  Started this morning Muscle aches No fevers Feeling bad Congestion Coughing    Depression screen [facility] 2/9 07/03/2015 03/09/2015  Decreased Interest 0 0  Down, Depressed, Hopeless 0 1  PHQ - 2 Score 0 1     Relevant past medical, surgical, family and social history reviewed and updated as indicated. Interim medical history since our last visit reviewed. Allergies and medications reviewed and updated.    ROS: Per HPI unless specifically indicated above  History  Smoking status  . Current Every Day Smoker  Smokeless tobacco  . Never Used    Past Medical History Patient Active Problem List   Diagnosis Date Noted  . Trigger finger, acquired 05/07/2015  . Gastroesophageal reflux disease without esophagitis 11/04/2014  . Essential hypertension 11/04/2014  . Depression 11/04/2014  . Generalized anxiety disorder 11/04/2014  . Annual physical exam 11/04/2014  . BMI 34.0-34.9,adult 11/04/2014  . Type 2 diabetes mellitus ([place]) 03/21/2014  . Abnormal transaminases 12/12/2012      Objective:    BP 130/80 mmHg  Pulse 95  Temp(Src) 99.4 F (37.4 C) (Oral)  Ht 5\' 1"  (1.549 m)  Wt 178 lb (80.74 kg)  BMI 33.65 kg/m2  Wt Readings from Last 3 Encounters:  07/03/15 178 lb (80.74 kg)  06/30/15 178 lb (80.74 kg)  03/19/15 185 lb (83.915 kg)     Gen: NAD, alert, cooperative with exam, NCAT EYES: EOMI, no scleral injection or icterus ENT: MMM CV: NRRR, normal S1/S2, no murmur, distal pulses 2+ b/l Resp: CTABL, no wheezes, normal WOB Neuro: Alert and oriented     Assessment & Plan:    P[place]ale was seen today for flu like symptoms, flu negative. Discussed sx care.  Diagnoses and all orders for this visit:  Flu-like symptoms -     Veritor Flu A/B Waived   Follow up  plan: prn  Assunta Found, MD [place]

## 2015-07-03 NOTE — Progress Notes (Signed)
    Subjective:    Patient ID: [name], female    DOB: 03-Aug-1965, 50 y.o.   MRN: [id number]  CC: Cough; Nasal Congestion; and Fever   HPI: [name] is a 50 y.o. female presenting for Cough; Nasal Congestion; and Fever  Feeling better today than yesterday Muscle aches improved Lots and lots of coughing, sometimes productive Incontinence with coughing No congestion Ears plugged Tested negative for flu two days ago No fevers at home but with night sweats and chills  Relevant past medical, surgical, family and social history reviewed and updated as indicated. Interim medical history since our last visit reviewed. Allergies and medications reviewed and updated.    ROS: Per HPI unless specifically indicated above   Past Medical History Patient Active Problem List   Diagnosis Date Noted  . Trigger finger, acquired 05/07/2015  . Gastroesophageal reflux disease without esophagitis 11/04/2014  . Essential hypertension 11/04/2014  . Depression 11/04/2014  . Generalized anxiety disorder 11/04/2014  . Annual physical exam 11/04/2014  . BMI 34.0-34.9,adult 11/04/2014  . Type 2 diabetes mellitus ([place]) 03/21/2014  . Abnormal transaminases 12/12/2012        Objective:    BP 124/85 mmHg  Pulse 82  Temp(Src) 99.1 F (37.3 C) (Oral)  Ht 5\' 1"  (1.549 m)  Wt 178 lb (80.74 kg)  BMI 33.65 kg/m2  Wt Readings from Last 3 Encounters:  07/03/15 178 lb (80.74 kg)  06/30/15 178 lb (80.74 kg)  03/19/15 185 lb (83.915 kg)     Gen: NAD, alert, cooperative with exam, NCAT EYES: EOMI, no scleral injection or icterus ENT:  TMs slightly red in some areas, normal LR b/l, OP with mild erythema CV: NRRR, normal S1/S2, no murmur, distal pulses 2+ b/l Resp: CTABL, no wheezes, normal WOB, coughing Ext: No edema, warm Neuro: Alert and oriented, strength equal b/l UE and LE, coordination grossly normal MSK: normal muscle bulk     Assessment & Plan:    [name] was seen today  for cough, nasal congestion and fever. CXR with no infiltrate. Continue symptomatic care.  Diagnoses and all orders for this visit:  Flu-like symptoms  Cough -     DG Chest 2 View; Future -     guaiFENesin-codeine 100-10 MG/5ML syrup; Take 5 mLs by mouth at bedtime as needed for cough.  Other chest pain -     DG Chest 2 View; Future   Follow up plan: prn  Assunta Found, MD [facility] 07/03/2015, 9:22 AM

## 2015-07-06 ENCOUNTER — Other Ambulatory Visit: Payer: Self-pay

## 2015-07-06 DIAGNOSIS — I10 Essential (primary) hypertension: Secondary | ICD-10-CM

## 2015-07-06 MED ORDER — PANTOPRAZOLE SODIUM 40 MG PO TBEC: 40.0000 mg | DELAYED_RELEASE_TABLET | Freq: Every day | ORAL | Status: DC

## 2015-07-06 MED ORDER — LISINOPRIL 20 MG PO TABS: 20.0000 mg | ORAL_TABLET | Freq: Every day | ORAL | Status: DC

## 2015-07-06 MED FILL — LISINOPRIL 20 MG TABLET: 20 | 90 days supply | Qty: 90 | Fill #0

## 2015-07-06 MED FILL — PANTOPRAZOLE SOD DR 40 MG T: 40 | 90 days supply | Qty: 90 | Fill #0

## 2015-07-20 ENCOUNTER — Encounter: Payer: Self-pay | Admitting: *Deleted

## 2015-07-21 ENCOUNTER — Ambulatory Visit (INDEPENDENT_AMBULATORY_CARE_PROVIDER_SITE_OTHER): Payer: 59 | Admitting: Family Medicine

## 2015-07-21 ENCOUNTER — Encounter: Payer: Self-pay | Admitting: Family Medicine

## 2015-07-21 VITALS — BP 142/82 | HR 73 | Temp 97.5°F | Ht 61.0 in | Wt 180.0 lb

## 2015-07-21 DIAGNOSIS — R079 Chest pain, unspecified: Secondary | ICD-10-CM | POA: Diagnosis not present

## 2015-07-21 DIAGNOSIS — I10 Essential (primary) hypertension: Secondary | ICD-10-CM

## 2015-07-21 DIAGNOSIS — R0789 Other chest pain: Secondary | ICD-10-CM | POA: Diagnosis not present

## 2015-07-21 DIAGNOSIS — N644 Mastodynia: Secondary | ICD-10-CM | POA: Insufficient documentation

## 2015-07-21 LAB — TROPONIN I: Troponin I: <0.01 ng/mL (ref 0.00–0.04)

## 2015-07-21 LAB — CREATININE KINASE MB: CK-MB Index: 1 ng/mL (ref 0.0–5.3)

## 2015-07-21 NOTE — Progress Notes (Signed)
Subjective:    Patient ID: [name], female    DOB: 1966/02/28, 50 y.o.   MRN: [id number]  HPI Patient here today for  Chest pain that started last night with squeezing chest pain and mild nausea. Symptoms began last night after she laid down. She took some times without relief. Pain was described as a squeezing accompanied by mild nausea but no diaphoresis. Pain persisted through the night.  Risk factors for cardiac pain include positive family history. Patient has hypertension and diabetes which are treated.  EKG was done this morning. It does not show any acute abnormalities but there is poor R-wave progression. Serum troponin was ordered as well as CPK. Both are negative for acute injury.  Additionally patient has been tired of late. She had a sleep study done several years ago which was apparently positive but she never did require CPAP machine. This could well be the cause of her tiredness.     Patient Active Problem List   Diagnosis Date Noted  . Trigger finger, acquired 05/07/2015  . Gastroesophageal reflux disease without esophagitis 11/04/2014  . Essential hypertension 11/04/2014  . Depression 11/04/2014  . Generalized anxiety disorder 11/04/2014  . Annual physical exam 11/04/2014  . BMI 34.0-34.9,adult 11/04/2014  . Type 2 diabetes mellitus ([place]) 03/21/2014  . Abnormal transaminases 12/12/2012   Outpatient Encounter Prescriptions as of 07/21/2015  Medication Sig  . lisinopril (PRINIVIL,ZESTRIL) 20 MG tablet Take 1 tablet (20 mg total) by mouth daily.  . metFORMIN (GLUCOPHAGE XR) 500 MG 24 hr tablet Take 2 tablets (1,000 mg total) by mouth daily. With a meal.  . pantoprazole (PROTONIX) 40 MG tablet Take 1 tablet (40 mg total) by mouth daily.  . sertraline (ZOLOFT) 100 MG tablet Take 1 tablet (100 mg total) by mouth daily.  Marland Kitchen ALPRAZolam (XANAX) 0.5 MG tablet Take 1 tablet (0.5 mg total) by mouth 2 (two) times daily as needed for anxiety. (Patient not taking:  Reported on 07/21/2015)  . [DISCONTINUED] guaiFENesin-codeine 100-10 MG/5ML syrup Take 5 mLs by mouth at bedtime as needed for cough. (Patient not taking: Reported on 07/21/2015)   No facility-administered encounter medications on file as of 07/21/2015.      Review of Systems  Constitutional: Negative.   HENT: Negative.   Eyes: Negative.   Respiratory: Negative.   Cardiovascular: Positive for chest pain.  Gastrointestinal: Positive for nausea.  Endocrine: Negative.   Genitourinary: Negative.   Musculoskeletal: Negative.   Skin: Negative.   Allergic/Immunologic: Negative.   Neurological: Negative.   Hematological: Negative.   Psychiatric/Behavioral: Negative.        Objective:   Physical Exam  Constitutional: She is oriented to person, place, and time. She appears well-developed and well-nourished.  Cardiovascular: Normal rate, regular rhythm, normal heart sounds and intact distal pulses.  Exam reveals no gallop.   Pulmonary/Chest: Effort normal and breath sounds normal.  Neurological: She is alert and oriented to person, place, and time.    BP 142/82 mmHg  Pulse 73  Temp(Src) 97.5 F (36.4 C) (Oral)  Ht 5\' 1"  (1.549 m)  Wt 180 lb (81.647 kg)  BMI 34.03 kg/m2       Assessment & Plan:  1. Other chest pain With no acute changes on EKG and negative serum troponin chest pain is not likely to be ischemic in nature. - Troponin I - CKMB - EKG 12-Lead  2. Essential hypertension Blood pressure could be better controlled with systolic at [id number] today. I would recommend follow-up with  sleep apnea. Patient is scheduled to take trip and she returns would look into acquisition of CPAP  Wardell Honour MD

## 2015-08-12 ENCOUNTER — Other Ambulatory Visit: Payer: Self-pay

## 2015-08-12 MED ORDER — METFORMIN HCL ER 500 MG PO TB24: 1000.0000 mg | ORAL_TABLET | Freq: Every day | ORAL | Status: DC

## 2015-08-12 MED FILL — METFORMIN HCL ER 500 MG TAB: 500 | 90 days supply | Qty: 180 | Fill #0

## 2015-09-23 ENCOUNTER — Other Ambulatory Visit: Payer: Self-pay | Admitting: *Deleted

## 2015-09-23 DIAGNOSIS — F32A Depression, unspecified: Secondary | ICD-10-CM

## 2015-09-23 DIAGNOSIS — F329 Major depressive disorder, single episode, unspecified: Secondary | ICD-10-CM

## 2015-09-23 MED ORDER — SERTRALINE HCL 100 MG PO TABS: 100.0000 mg | ORAL_TABLET | Freq: Every day | ORAL | Status: DC

## 2015-09-23 MED FILL — SERTRALINE HCL 100 MG TAB: 100 | 90 days supply | Qty: 90 | Fill #0

## 2015-10-13 MED FILL — ESTRADIOL 0.1 MG PATCH: 0.1 | 84 days supply | Qty: 24 | Fill #2

## 2015-10-13 MED FILL — LISINOPRIL 20 MG TABLET: 20 | 90 days supply | Qty: 90 | Fill #2

## 2015-10-16 ENCOUNTER — Other Ambulatory Visit: Payer: Self-pay | Admitting: Nurse Practitioner

## 2015-10-16 MED ORDER — DOXYCYCLINE HYCLATE 100 MG PO TABS: 100.0000 mg | ORAL_TABLET | Freq: Two times a day (BID) | ORAL | Status: DC

## 2015-10-19 ENCOUNTER — Ambulatory Visit (INDEPENDENT_AMBULATORY_CARE_PROVIDER_SITE_OTHER): Payer: 59

## 2015-10-19 ENCOUNTER — Encounter: Payer: Self-pay | Admitting: Family

## 2015-10-19 ENCOUNTER — Other Ambulatory Visit: Payer: Self-pay | Admitting: Family

## 2015-10-19 ENCOUNTER — Ambulatory Visit (INDEPENDENT_AMBULATORY_CARE_PROVIDER_SITE_OTHER): Payer: 59 | Admitting: Family

## 2015-10-19 VITALS — BP 126/76 | HR 74 | Temp 97.1°F | Ht 61.0 in | Wt 187.4 lb

## 2015-10-19 DIAGNOSIS — R52 Pain, unspecified: Secondary | ICD-10-CM

## 2015-10-19 DIAGNOSIS — W101XXA Fall (on)(from) sidewalk curb, initial encounter: Secondary | ICD-10-CM

## 2015-10-19 DIAGNOSIS — M25572 Pain in left ankle and joints of left foot: Secondary | ICD-10-CM

## 2015-10-19 DIAGNOSIS — S93402A Sprain of unspecified ligament of left ankle, initial encounter: Secondary | ICD-10-CM

## 2015-10-19 DIAGNOSIS — M25512 Pain in left shoulder: Secondary | ICD-10-CM

## 2015-10-19 MED ORDER — KETOROLAC TROMETHAMINE 60 MG/2ML IM SOLN: 60.0000 mg | Freq: Once | INTRAMUSCULAR | Status: DC

## 2015-10-19 MED ORDER — NAPROXEN 500 MG PO TABS: 500.0000 mg | ORAL_TABLET | Freq: Two times a day (BID) | ORAL | Status: DC

## 2015-10-19 NOTE — Progress Notes (Signed)
Subjective:    Patient ID: [name], female    DOB: 12-11-1965, 50 y.o.   MRN: [id number]  Pt presents to the office today with left ankle pain. Pt states she fell three days and twisted her ankle. Ankle Pain  The incident occurred 3 to 5 days ago. The injury mechanism was a fall and a twisting injury. The pain is present in the left ankle. The quality of the pain is described as aching. The pain is at a severity of 6/10. The pain is moderate. The pain has been constant since onset. Pertinent negatives include no inability to bear weight, muscle weakness, numbness or tingling. She reports no foreign bodies present. The symptoms are aggravated by movement and weight bearing. She has tried ice and acetaminophen for the symptoms. The treatment provided mild relief.  Shoulder Pain  The pain is present in the left shoulder. This is a chronic problem. The current episode started more than 1 year ago. There has been a history of trauma. The problem occurs intermittently. The problem has been unchanged. The quality of the pain is described as aching. The pain is at a severity of 3/10. The pain is moderate. Associated symptoms include stiffness. Pertinent negatives include no inability to bear weight, joint locking, joint swelling, limited range of motion, numbness or tingling. She has tried NSAIDS for the symptoms. The treatment provided mild relief.      Review of Systems  Constitutional: Negative.   HENT: Negative.   Eyes: Negative.   Respiratory: Negative.  Negative for shortness of breath.   Cardiovascular: Negative.  Negative for palpitations.  Gastrointestinal: Negative.   Endocrine: Negative.   Genitourinary: Negative.   Musculoskeletal: Positive for joint swelling and stiffness.  Neurological: Negative.  Negative for tingling, numbness and headaches.  Hematological: Negative.   Psychiatric/Behavioral: Negative.   All other systems reviewed and are negative.      Objective:   Physical Exam  Constitutional: She is oriented to person, place, and time. She appears well-developed and well-nourished. No distress.  Cardiovascular: Normal rate, regular rhythm, normal heart sounds and intact distal pulses.   No murmur heard. Pulmonary/Chest: Effort normal and breath sounds normal. No respiratory distress. She has no wheezes.  Abdominal: Soft. Bowel sounds are normal. She exhibits no distension. There is no tenderness.  Musculoskeletal: She exhibits edema (left ankle swelling and tenderness, brusing presnt on lateral ankle) and tenderness.  Neurological: She is alert and oriented to person, place, and time.  Skin: Skin is warm and dry.  Psychiatric: She has a normal mood and affect. Her behavior is normal. Judgment and thought content normal.  Vitals reviewed.   BP 126/76 mmHg  Pulse 74  Temp(Src) 97.1 F (36.2 C) (Oral)  Ht 5\' 1"  (1.549 m)  Wt 187 lb 6.4 oz (85.004 kg)  BMI 35.43 kg/m2  Xray- negative for fracture Preliminary reading by Evelina Dun, FNP [facility]      Assessment & Plan:  1. Pain in joint, ankle and foot, left - DG Foot Complete Left; Future - naproxen (NAPROSYN) 500 MG tablet; Take 1 tablet (500 mg total) by mouth 2 (two) times daily with a meal.  Dispense: 60 tablet; Refill: 1 - ketorolac (TORADOL) injection 60 mg; Inject 2 mLs (60 mg total) into the muscle once.  2. Pain in joint of left shoulder - DG Shoulder Left; Future - naproxen (NAPROSYN) 500 MG tablet; Take 1 tablet (500 mg total) by mouth 2 (two) times daily with a meal.  Dispense: 60 tablet; Refill: 1 - ketorolac (TORADOL) injection 60 mg; Inject 2 mLs (60 mg total) into the muscle once.  3. Fall on or from sidewalk curb, initial encounter - DG Foot Complete Left; Future - naproxen (NAPROSYN) 500 MG tablet; Take 1 tablet (500 mg total) by mouth 2 (two) times daily with a meal.  Dispense: 60 tablet; Refill: 1 - ketorolac (TORADOL) injection 60 mg; Inject 2 mLs (60 mg total) into the  muscle once.  4. Sprain of ankle, left, initial encounter - naproxen (NAPROSYN) 500 MG tablet; Take 1 tablet (500 mg total) by mouth 2 (two) times daily with a meal.  Dispense: 60 tablet; Refill: 1 - ketorolac (TORADOL) injection 60 mg; Inject 2 mLs (60 mg total) into the muscle once.   Rest Ice  Compression ROM exercises encouraged- handout given RTO PRN  Evelina Dun, FNP

## 2015-10-19 NOTE — Patient Instructions (Signed)

## 2015-11-03 ENCOUNTER — Other Ambulatory Visit: Payer: Self-pay

## 2015-11-03 MED ORDER — PANTOPRAZOLE SODIUM 40 MG PO TBEC: 40.0000 mg | DELAYED_RELEASE_TABLET | Freq: Every day | ORAL | Status: DC

## 2015-11-03 MED FILL — PANTOPRAZOLE SOD DR 40 MG T: 40 | 90 days supply | Qty: 90 | Fill #1

## 2015-11-06 ENCOUNTER — Encounter: Payer: 59 | Admitting: Nurse Practitioner

## 2015-12-07 ENCOUNTER — Encounter: Payer: Self-pay | Admitting: Family Medicine

## 2015-12-07 ENCOUNTER — Ambulatory Visit (INDEPENDENT_AMBULATORY_CARE_PROVIDER_SITE_OTHER): Payer: 59 | Admitting: Family Medicine

## 2015-12-07 VITALS — BP 122/81 | HR 98 | Temp 99.1°F | Ht 61.0 in | Wt 187.0 lb

## 2015-12-07 DIAGNOSIS — R197 Diarrhea, unspecified: Secondary | ICD-10-CM

## 2015-12-07 DIAGNOSIS — R112 Nausea with vomiting, unspecified: Secondary | ICD-10-CM | POA: Diagnosis not present

## 2015-12-07 DIAGNOSIS — R000 Tachycardia, unspecified: Secondary | ICD-10-CM | POA: Diagnosis not present

## 2015-12-07 MED ORDER — PROMETHAZINE HCL 25 MG PO TABS: 25.0000 mg | ORAL_TABLET | Freq: Three times a day (TID) | ORAL | 0 refills | Status: DC | PRN

## 2015-12-07 MED ORDER — SODIUM CHLORIDE 0.9 % IV SOLN
INTRAVENOUS | Status: AC
  Administered 2015-12-07: 10:00:00 via INTRAVENOUS

## 2015-12-07 MED ORDER — PROMETHAZINE HCL 25 MG/ML IJ SOLN
25.0000 mg | Freq: Once | INTRAMUSCULAR | Status: AC
  Administered 2015-12-07: 25 mg via INTRAMUSCULAR

## 2015-12-07 MED ORDER — ONDANSETRON 4 MG PO TBDP
4.0000 mg | ORAL_TABLET | Freq: Once | ORAL | Status: AC
  Administered 2015-12-07: 4 mg via ORAL

## 2015-12-07 MED ORDER — PROMETHAZINE HCL 25 MG/ML IJ SOLN
25.0000 mg | Freq: Once | INTRAMUSCULAR | 0 refills | Status: DC
Start: 2015-12-07 — End: 2015-12-07

## 2015-12-07 NOTE — Addendum Note (Signed)
Addended by: Louann Sjogren A on: 12/07/2015 11:45 AM   Modules accepted: Orders

## 2015-12-07 NOTE — Progress Notes (Signed)
   HPI  Patient presents today here with diarrhea and tachycardia.  Pt is an Rn in our clinic   Patient's  Explains for the last 3 days she's had diarrhea, approximately 15-20 episodes per day, sweating, malaise, and feeling unwell. She denies any cough, shortness of breath.   Denies any sick contacts.  She is tolerating fluids.  She had 4-5 episodes of emesis on day 1 of illness, however that has resolved.  His well-controlled diabetes, uncontrolled hyperlipidemia   PMH: Smoking status noted ROS: Per HPI  Objective: BP 122/81   Pulse 98   Temp 99.1 F (37.3 C) (Oral)   Ht '5\' 1"'$  (1.549 m)   Wt 187 lb (84.8 kg)   BMI 35.33 kg/m  Gen: NAD, alert, cooperative with exam, sweating HEENT: NCAT CV: RRR, good S1/S2, no murmur Resp: CTABL, no wheezes, non-labored Abd: SNTND, BS present, no guarding or organomegaly Ext: No edema, warm Neuro: Alert and oriented, No gross deficits  Assessment and plan:  # Diarrhea, dehydration Improving after 1 L of normal saline Patient still with some nausea and vomiting. 2-3 episodes of vomiting while under observation here today Given Zofran orally and IM Phenergan. Phenergan Rx for home Red flags reviewed, low threshold for return and/or emergency room if symptoms worsen. Stat labs are pending including troponin, EKG is normal.      Orders Placed This Encounter  Procedures  . CMP14+EGFR  . CBC with Differential  . EKG 12-Lead    Meds ordered this encounter  Medications  . estradiol (VIVELLE-DOT) 0.1 MG/24HR patch    Refill:  [place], MD [facility] 12/07/2015, 8:02 AM

## 2015-12-07 NOTE — Progress Notes (Signed)
IV infusion completed 1,000 ml of NS. IV removed at 10:00am per order of Dr. Wendi Snipes

## 2015-12-07 NOTE — Patient Instructions (Signed)

## 2015-12-07 NOTE — Progress Notes (Signed)
Patient ID: [name], female   DOB: [birth year], 50 y.o.   MRN: [id number] Had dry heaves and assisted pt. to bathroom and patient reported she had diarrhea. Zofran 4mg  ODT given.

## 2015-12-08 LAB — CBC WITH DIFFERENTIAL/PLATELET
Basophils Absolute: 0 10*3/uL (ref 0.0–0.2)
Basos: 0 %
EOS (ABSOLUTE): 0 10*3/uL (ref 0.0–0.4)
Eos: 0 %
HEMOGLOBIN: 16.7 g/dL — AB (ref 11.1–15.9)
Hematocrit: 48.7 % — ABNORMAL HIGH (ref 34.0–46.6)
Immature Grans (Abs): 0 10*3/uL (ref 0.0–0.1)
Immature Granulocytes: 0 %
Lymphocytes Absolute: 0.9 10*3/uL (ref 0.7–3.1)
Lymphs: 8 %
MCH: 31 pg (ref 26.6–33.0)
MCHC: 34.3 g/dL (ref 31.5–35.7)
MCV: 91 fL (ref 79–97)
MONOS ABS: 0.6 10*3/uL (ref 0.1–0.9)
Monocytes: 5 %
NEUTROS ABS: 9.9 10*3/uL — AB (ref 1.4–7.0)
Neutrophils: 87 %
PLATELETS: 325 10*3/uL (ref 150–379)
RBC: 5.38 x10E6/uL — ABNORMAL HIGH (ref 3.77–5.28)
RDW: 12.8 % (ref 12.3–15.4)
WBC: 11.5 10*3/uL — ABNORMAL HIGH (ref 3.4–10.8)

## 2015-12-08 LAB — CMP14+EGFR
ALK PHOS: 76 IU/L (ref 39–117)
ALT: 24 IU/L (ref 0–32)
AST: 21 IU/L (ref 0–40)
Albumin/Globulin Ratio: 1.5 (ref 1.2–2.2)
Albumin: 4.4 g/dL (ref 3.5–5.5)
BUN/Creatinine Ratio: 16 (ref 9–23)
BUN: 13 mg/dL (ref 6–24)
Bilirubin Total: 0.6 mg/dL (ref 0.0–1.2)
CO2: 19 mmol/L (ref 18–29)
Calcium: 9.8 mg/dL (ref 8.7–10.2)
Chloride: 96 mmol/L (ref 96–106)
Creatinine, Ser: 0.81 mg/dL (ref 0.57–1.00)
GFR calc non Af Amer: 86 mL/min/{1.73_m2} (ref >59)
GFR, EST AFRICAN AMERICAN: 99 mL/min/{1.73_m2} (ref >59)
GLOBULIN, TOTAL: 2.9 g/dL (ref 1.5–4.5)
GLUCOSE: 130 mg/dL — AB (ref 65–99)
POTASSIUM: 4 mmol/L (ref 3.5–5.2)
Sodium: 134 mmol/L (ref 134–144)
Total Protein: 7.3 g/dL (ref 6.0–8.5)

## 2015-12-08 LAB — TROPONIN I: Troponin I: <0.01 ng/mL (ref 0.00–0.04)

## 2015-12-09 ENCOUNTER — Telehealth: Payer: Self-pay | Admitting: Physician Assistant

## 2015-12-09 MED ORDER — VALACYCLOVIR HCL 1 G PO TABS: 1000.0000 mg | ORAL_TABLET | Freq: Two times a day (BID) | ORAL | 0 refills | Status: DC

## 2015-12-09 NOTE — Telephone Encounter (Signed)
Prescription sent to pharmacy.

## 2015-12-15 ENCOUNTER — Encounter: Payer: Self-pay | Admitting: Family Medicine

## 2015-12-15 ENCOUNTER — Ambulatory Visit (INDEPENDENT_AMBULATORY_CARE_PROVIDER_SITE_OTHER): Payer: 59

## 2015-12-15 ENCOUNTER — Ambulatory Visit (INDEPENDENT_AMBULATORY_CARE_PROVIDER_SITE_OTHER): Payer: 59 | Admitting: Family Medicine

## 2015-12-15 ENCOUNTER — Other Ambulatory Visit: Payer: Self-pay

## 2015-12-15 VITALS — BP 158/86 | HR 95 | Temp 97.6°F | Ht 61.0 in | Wt 185.0 lb

## 2015-12-15 DIAGNOSIS — S42201A Unspecified fracture of upper end of right humerus, initial encounter for closed fracture: Secondary | ICD-10-CM | POA: Diagnosis not present

## 2015-12-15 DIAGNOSIS — M25511 Pain in right shoulder: Secondary | ICD-10-CM

## 2015-12-15 DIAGNOSIS — S42294A Other nondisplaced fracture of upper end of right humerus, initial encounter for closed fracture: Secondary | ICD-10-CM | POA: Diagnosis not present

## 2015-12-15 NOTE — Progress Notes (Signed)
   HPI  Patient presents today with right shoulder pain and difficulty moving her right arm.  Patient explains that on Sunday, 3 days ago she lost balance and fell into her right hand rail without falling completely. She landed on her right shoulder that time. She had immediate right shoulder pain and felt that it was due to an old rotator cuff injury. Today she had persistent pain in inability to move her shoulder so she's been seen.  X-ray reveals nondisplaced fracture of the proximal humerus.  Her pain is reasonable presently.   PMH: Smoking status noted ROS: Per HPI  Objective: BP (!) 158/86   Pulse 95   Temp 97.6 F (36.4 C) (Oral)   Ht 5\' 1"  (1.549 m)   Wt 185 lb (83.9 kg)   BMI 34.96 kg/m  Gen: NAD, alert, cooperative with exam HEENT: NCAT, EOMI, PERRL CV: RRR, good S1/S2, no murmur Resp: CTABL, no wheezes, non-labored Ext: No edema, warm Neuro: Alert and oriented, No gross deficits  Musculoskeletal Tenderness to palpation of the right shoulder, normal movement and use of the right hand but inability to move the right arm without pain.  X-ray: Right proximal humerus nondisplaced fracture  Assessment and plan:  # Nondisplaced fracture of the right proximal humerus Urgent orthopedic referral, patient is placed in the shoulder sling for comfort and she has a family member that can drive her to the orthopedics practice. Follow-up here as needed.    Laroy Apple, MD [facility] 12/15/2015, 9:52 AM

## 2015-12-17 ENCOUNTER — Ambulatory Visit: Payer: 59 | Admitting: Family Medicine

## 2015-12-23 ENCOUNTER — Other Ambulatory Visit: Payer: Self-pay

## 2015-12-23 DIAGNOSIS — F32A Depression, unspecified: Secondary | ICD-10-CM

## 2015-12-23 DIAGNOSIS — F329 Major depressive disorder, single episode, unspecified: Secondary | ICD-10-CM

## 2015-12-23 MED ORDER — METFORMIN HCL ER 500 MG PO TB24: 1000.0000 mg | ORAL_TABLET | Freq: Every day | ORAL | 0 refills | Status: DC

## 2015-12-23 MED ORDER — SERTRALINE HCL 100 MG PO TABS: 100.0000 mg | ORAL_TABLET | Freq: Every day | ORAL | 0 refills | Status: DC

## 2015-12-23 MED FILL — SERTRALINE HCL 100 MG TAB: 100 | 90 days supply | Qty: 90 | Fill #0

## 2015-12-23 MED FILL — METFORMIN HCL ER 500 MG TAB: 500 | 90 days supply | Qty: 180 | Fill #0

## 2015-12-29 DIAGNOSIS — S42294A Other nondisplaced fracture of upper end of right humerus, initial encounter for closed fracture: Secondary | ICD-10-CM | POA: Diagnosis not present

## 2016-01-08 DIAGNOSIS — S42294D Other nondisplaced fracture of upper end of right humerus, subsequent encounter for fracture with routine healing: Secondary | ICD-10-CM | POA: Diagnosis not present

## 2016-01-15 DIAGNOSIS — M25511 Pain in right shoulder: Secondary | ICD-10-CM | POA: Diagnosis not present

## 2016-01-15 DIAGNOSIS — S42294D Other nondisplaced fracture of upper end of right humerus, subsequent encounter for fracture with routine healing: Secondary | ICD-10-CM | POA: Diagnosis not present

## 2016-01-26 ENCOUNTER — Other Ambulatory Visit: Payer: Self-pay

## 2016-01-26 ENCOUNTER — Encounter: Payer: Self-pay | Admitting: Family Medicine

## 2016-01-26 ENCOUNTER — Ambulatory Visit (INDEPENDENT_AMBULATORY_CARE_PROVIDER_SITE_OTHER): Payer: 59 | Admitting: Family Medicine

## 2016-01-26 VITALS — BP 127/75 | HR 84 | Temp 97.7°F | Ht 61.0 in | Wt 185.0 lb

## 2016-01-26 DIAGNOSIS — M6530 Trigger finger, unspecified finger: Secondary | ICD-10-CM

## 2016-01-26 DIAGNOSIS — I10 Essential (primary) hypertension: Secondary | ICD-10-CM

## 2016-01-26 MED ORDER — LISINOPRIL 20 MG PO TABS: 20.0000 mg | ORAL_TABLET | Freq: Every day | ORAL | 0 refills | Status: DC

## 2016-01-26 MED FILL — LISINOPRIL 20 MG TABLET: 20 | 90 days supply | Qty: 90 | Fill #0

## 2016-01-26 NOTE — Progress Notes (Signed)
   Subjective:    Patient ID: [name], female    DOB: [birth year], 50 y.o.   MRN: [id number]  HPI patient with known history of trigger finger. We have injected the nodule at the flexor crease several times previously with good results. Patient is having increased symptoms of pain in the area now. Last injection was 10 months ago.  Patient Active Problem List   Diagnosis Date Noted  . Chest pain 07/21/2015  . Trigger finger, acquired 05/07/2015  . Gastroesophageal reflux disease without esophagitis 11/04/2014  . Essential hypertension 11/04/2014  . Depression 11/04/2014  . Generalized anxiety disorder 11/04/2014  . Annual physical exam 11/04/2014  . BMI 34.0-34.9,adult 11/04/2014  . Type 2 diabetes mellitus ([place]) 03/21/2014  . Abnormal transaminases 12/12/2012   Outpatient Encounter Prescriptions as of 01/26/2016  Medication Sig  . ALPRAZolam (XANAX) 0.5 MG tablet Take 1 tablet (0.5 mg total) by mouth 2 (two) times daily as needed for anxiety.  Marland Kitchen estradiol (VIVELLE-DOT) 0.1 MG/24HR patch   . lisinopril (PRINIVIL,ZESTRIL) 20 MG tablet Take 1 tablet (20 mg total) by mouth daily.  . metFORMIN (GLUCOPHAGE XR) 500 MG 24 hr tablet Take 2 tablets (1,000 mg total) by mouth daily. With a meal.  . naproxen (NAPROSYN) 500 MG tablet Take 1 tablet (500 mg total) by mouth 2 (two) times daily with a meal.  . pantoprazole (PROTONIX) 40 MG tablet Take 1 tablet (40 mg total) by mouth daily.  . sertraline (ZOLOFT) 100 MG tablet Take 1 tablet (100 mg total) by mouth daily.  . valACYclovir (VALTREX) 1000 MG tablet Take 1 tablet (1,000 mg total) by mouth 2 (two) times daily.  . [DISCONTINUED] promethazine (PHENERGAN) 25 MG tablet Take 1 tablet (25 mg total) by mouth every 8 (eight) hours as needed for nausea or vomiting.   No facility-administered encounter medications on file as of 01/26/2016.       Review of Systems  Musculoskeletal: Positive for arthralgias.       Objective:   Physical  Exam  Musculoskeletal:  Nodule palpated at base of left thumb. Tenderness is 4+. Nodule was injected with small amount of Kenalog. Patient tolerated procedure well.   BP 127/75   Pulse 84   Temp 97.7 F (36.5 C) (Oral)   Ht 5\' 1"  (1.549 m)   Wt 185 lb (83.9 kg)   BMI 34.96 kg/m         Assessment & Plan:  1. Trigger finger, acquired Acted as described above  Wardell Honour MD

## 2016-02-08 ENCOUNTER — Other Ambulatory Visit: Payer: Self-pay

## 2016-02-08 DIAGNOSIS — S42411D Displaced simple supracondylar fracture without intercondylar fracture of right humerus, subsequent encounter for fracture with routine healing: Secondary | ICD-10-CM

## 2016-02-16 ENCOUNTER — Ambulatory Visit: Payer: 59 | Attending: Family Medicine | Admitting: Physical Therapy

## 2016-02-16 ENCOUNTER — Other Ambulatory Visit: Payer: Self-pay

## 2016-02-16 DIAGNOSIS — M25611 Stiffness of right shoulder, not elsewhere classified: Secondary | ICD-10-CM | POA: Diagnosis not present

## 2016-02-16 DIAGNOSIS — M25511 Pain in right shoulder: Secondary | ICD-10-CM | POA: Insufficient documentation

## 2016-02-16 MED ORDER — PANTOPRAZOLE SODIUM 40 MG PO TBEC: 40.0000 mg | DELAYED_RELEASE_TABLET | Freq: Every day | ORAL | 0 refills | Status: DC

## 2016-02-16 MED FILL — PANTOPRAZOLE SOD DR 40 MG T: 40 | 90 days supply | Qty: 90 | Fill #0

## 2016-02-16 NOTE — Patient Instructions (Signed)
Instruct in pendulum exercise; supine cane exercise to increase ER and left hand grasping right wrist to increase flexion in supine.

## 2016-02-16 NOTE — Therapy (Signed)
[facility] [place], [place], [address] Phone: [phone]   Fax:  [phone]  Physical Therapy Treatment  Patient Details  Name: [name] MRN: [id number] Date of Birth: 06/28/1965 Referring Provider: Kenn File MD.  Encounter Date: 02/16/2016      PT End of Session - 02/16/16 1419    Visit Number 1   Number of Visits 12   Date for PT Re-Evaluation 03/29/16   PT Start Time 0105   PT Stop Time 0155   PT Time Calculation (min) 50 min   Activity Tolerance Patient tolerated treatment well   Behavior During Therapy [facility] for tasks assessed/performed      Past Medical History:  Diagnosis Date  . Anxiety   . Diabetes mellitus without complication ([place])   . Elevated heart rate and blood pressure   . Esophageal reflux     Past Surgical History:  Procedure Laterality Date  . LAPAROSCOPIC VAGINAL HYSTERECTOMY N/A 05 10 2012    There were no vitals filed for this visit.      Subjective Assessment - 02/16/16 1422    Subjective the patient fell on 12/13/15 and sustained a right proximal humeral fracture.  She was in a sling for four weeks and then began pendulum and wall climb exercises.  She reports that after exercise her right shoulder pain rises to an 8/10 and is very achy with pain radiating down her right upper arm.  She hopes to increase her range of motion.    Her resting pain-level is a 3/10 today.   Patient Stated Goals Use right UE better with less pain.   Currently in Pain? Yes   Pain Score 3    Pain Location Shoulder   Pain Orientation Right   Pain Descriptors / Indicators Aching   Pain Type Acute pain   Pain Onset More than a month ago   Pain Frequency Constant   Aggravating Factors  Movement.   Pain Relieving Factors rest.            [facility] PT Assessment - 02/16/16 0001      Assessment   Medical Diagnosis Right proximal humeral fracure.   Referring Provider Kenn File MD.    Onset Date/Surgical Date --  12/13/15.     Precautions   Precaution Comments No ultrasound.     Restrictions   Weight Bearing Restrictions No   Other Position/Activity Restrictions --  1.     Balance Screen   Has the patient fallen in the past 6 months Yes   How many times? --  1.   Has the patient had a decrease in activity level because of a fear of falling?  No   Is the patient reluctant to leave their home because of a fear of falling?  No     Home Ecologist residence     Prior Function   Level of Independence Independent     ROM / Strength   AROM / PROM / Strength AROM     AROM   Overall AROM Comments In supine AAROM right shoulder flexion= 88 degrees and ER= 25 degrees.     Palpation   Palpation comment Tender over proximal anterior right shoulder region with referred pain down arm.     Ambulation/Gait   Gait Comments WNL.                     [facility] Adult PT Treatment/Exercise - 02/16/16  0001      Modalities   Modalities Electrical Stimulation;Moist Heat     Moist Heat Therapy   Number Minutes Moist Heat 15 Minutes   Moist Heat Location Shoulder     Electrical Stimulation   Electrical Stimulation Location RT SHLD.   Electrical Stimulation Action IFC   Electrical Stimulation Parameters 80-150 HZ at 100% scan x 15 minutes.   Electrical Stimulation Goals Pain                PT Education - 02/16/16 1427    Education provided Yes   Person(s) Educated Patient   Methods Explanation;Demonstration;Tactile cues   Comprehension Verbalized understanding;Returned demonstration             PT Long Term Goals - 02/16/16 1429      PT LONG TERM GOAL #1   Title Independent with a HEP.   Time 6   Period Weeks   Status New     PT LONG TERM GOAL #2   Title Active right shoulder flexion to 145 degrees so the patient can easily reach overhead   Time 6   Period Weeks   Status New     PT LONG TERM GOAL #3    Title Active ER to 70 degrees+ to allow for easily donning/doffing of apparel.   Time 6   Period Weeks   Status New     PT LONG TERM GOAL #4   Title Increase ROM so patient is able to reach behind back to L3.   Time 6   Period Weeks   Status New     PT LONG TERM GOAL #5   Title Perform ADL's with pain not > 3/10.   Time 6   Period Weeks   Status New               Plan - 02/16/16 1427    Clinical Impression Statement The patient has a notable loss of right shoulder range of motion and high pain-levels with increased activity limiting her ability to perform ADL's.   Rehab Potential Good   PT Frequency 2x / week   PT Duration 8 weeks   PT Treatment/Interventions ADLs/Self Care Home Management;Cryotherapy;Electrical Stimulation;Moist Heat;Patient/family education;Therapeutic activities;Therapeutic exercise;Manual techniques;Passive range of motion   PT Next Visit Plan Pulleys; UE Ranger; cane exercises; wall ladder; PROM; moist heat and e'stim.  No ultrasound.      Patient will benefit from skilled therapeutic intervention in order to improve the following deficits and impairments:  Pain, Decreased activity tolerance, Decreased range of motion  Visit Diagnosis: Acute pain of right shoulder - Plan: PT plan of care cert/re-cert  Stiffness of right shoulder, not elsewhere classified - Plan: PT plan of care cert/re-cert     Problem List Patient Active Problem List   Diagnosis Date Noted  . Chest pain 07/21/2015  . Trigger finger, acquired 05/07/2015  . Gastroesophageal reflux disease without esophagitis 11/04/2014  . Essential hypertension 11/04/2014  . Depression 11/04/2014  . Generalized anxiety disorder 11/04/2014  . Annual physical exam 11/04/2014  . BMI 34.0-34.9,adult 11/04/2014  . Type 2 diabetes mellitus ([place]) 03/21/2014  . Abnormal transaminases 12/12/2012    APPLE[place], [place] MPT 02/16/2016, 2:43 PM  [facility] [address] [place], [place], [address] Phone: [phone]   Fax:  [phone]  Name: [name] MRN: [id number] Date of Birth: [birth year]

## 2016-02-22 ENCOUNTER — Ambulatory Visit: Payer: 59 | Attending: Family Medicine | Admitting: Physical Therapy

## 2016-02-22 DIAGNOSIS — M25511 Pain in right shoulder: Secondary | ICD-10-CM | POA: Insufficient documentation

## 2016-02-22 DIAGNOSIS — M25611 Stiffness of right shoulder, not elsewhere classified: Secondary | ICD-10-CM | POA: Insufficient documentation

## 2016-02-22 DIAGNOSIS — R399 Unspecified symptoms and signs involving the genitourinary system: Secondary | ICD-10-CM | POA: Diagnosis not present

## 2016-02-22 NOTE — Therapy (Signed)
[facility] [place], [place], [address] Phone: [phone]   Fax:  [phone]  Physical Therapy Treatment  Patient Details  Name: [name] MRN: [id number] Date of Birth: Aug 22, 1965 Referring Provider: Kenn File MD.  Encounter Date: 02/22/2016      PT End of Session - 02/22/16 1334    Visit Number 2   Number of Visits 12   Date for PT Re-Evaluation 03/29/16   PT Start Time 0102   PT Stop Time 0155   PT Time Calculation (min) 53 min   Activity Tolerance Patient tolerated treatment well   Behavior During Therapy [facility] for tasks assessed/performed      Past Medical History:  Diagnosis Date  . Anxiety   . Diabetes mellitus without complication ([place])   . Elevated heart rate and blood pressure   . Esophageal reflux     Past Surgical History:  Procedure Laterality Date  . LAPAROSCOPIC VAGINAL HYSTERECTOMY N/A 05 10 2012    There were no vitals filed for this visit.      Subjective Assessment - 02/22/16 1335    Subjective I put up my Christmas tree last weekend and my shoulder is hurting quite a bit and is achy.   Patient Stated Goals Use right UE better with less pain.   Pain Score 5    Pain Location Shoulder   Pain Descriptors / Indicators Aching   Pain Type Acute pain   Pain Onset More than a month ago                         [facility] Adult PT Treatment/Exercise - 02/22/16 0001      Exercises   Exercises Shoulder     Shoulder Exercises: Standing   Other Standing Exercises Wall ladder x 5 minutes.   Other Standing Exercises UE Ranger into flexion x 3 minutes.     Shoulder Exercises: Pulleys   Flexion Limitations 5 minutes.     Modalities   Modalities Electrical Stimulation     Moist Heat Therapy   Number Minutes Moist Heat 15 Minutes   Moist Heat Location --  Right shoulder.     Acupuncturist Location RT SHLD.   Electrical  Stimulation Action IFC   Electrical Stimulation Parameters 80-150 HZ x 15 minutes.   Electrical Stimulation Goals Pain     Manual Therapy   Manual therapy comments In supine:  Gentle PROM into right shoulder flexion and ER x 10 minutes.                     PT Long Term Goals - 02/16/16 1429      PT LONG TERM GOAL #1   Title Independent with a HEP.   Time 6   Period Weeks   Status New     PT LONG TERM GOAL #2   Title Active right shoulder flexion to 145 degrees so the patient can easily reach overhead   Time 6   Period Weeks   Status New     PT LONG TERM GOAL #3   Title Active ER to 70 degrees+ to allow for easily donning/doffing of apparel.   Time 6   Period Weeks   Status New     PT LONG TERM GOAL #4   Title Increase ROM so patient is able to reach behind back to L3.   Time 6   Period Weeks  Status New     PT LONG TERM GOAL #5   Title Perform ADL's with pain not > 3/10.   Time 6   Period Weeks   Status New               Plan - 02/22/16 1340    PT Treatment/Interventions ADLs/Self Care Home Management;Cryotherapy;Electrical Stimulation;Moist Heat;Patient/family education;Therapeutic activities;Therapeutic exercise;Manual techniques;Passive range of motion      Patient will benefit from skilled therapeutic intervention in order to improve the following deficits and impairments:     Visit Diagnosis: Acute pain of right shoulder  Stiffness of right shoulder, not elsewhere classified     Problem List Patient Active Problem List   Diagnosis Date Noted  . Chest pain 07/21/2015  . Trigger finger, acquired 05/07/2015  . Gastroesophageal reflux disease without esophagitis 11/04/2014  . Essential hypertension 11/04/2014  . Depression 11/04/2014  . Generalized anxiety disorder 11/04/2014  . Annual physical exam 11/04/2014  . BMI 34.0-34.9,adult 11/04/2014  . Type 2 diabetes mellitus ([place]) 03/21/2014  . Abnormal transaminases 12/12/2012     Ayvion Kavanagh, [place] 02/22/2016, 1:56 PM  [facility] [address] [place], [place], [address] Phone: [phone]   Fax:  [phone]  Name: ANJULIE PEELER MRN: [id number] Date of Birth: Jun 12, 1965

## 2016-02-23 ENCOUNTER — Other Ambulatory Visit: Payer: Self-pay | Admitting: Family Medicine

## 2016-02-23 DIAGNOSIS — Z1231 Encounter for screening mammogram for malignant neoplasm of breast: Secondary | ICD-10-CM

## 2016-02-23 DIAGNOSIS — H5213 Myopia, bilateral: Secondary | ICD-10-CM | POA: Diagnosis not present

## 2016-02-24 ENCOUNTER — Encounter: Payer: Self-pay | Admitting: Physical Therapy

## 2016-02-24 ENCOUNTER — Ambulatory Visit: Payer: 59 | Admitting: Physical Therapy

## 2016-02-24 DIAGNOSIS — M25611 Stiffness of right shoulder, not elsewhere classified: Secondary | ICD-10-CM | POA: Diagnosis not present

## 2016-02-24 DIAGNOSIS — M25511 Pain in right shoulder: Secondary | ICD-10-CM

## 2016-02-24 DIAGNOSIS — R399 Unspecified symptoms and signs involving the genitourinary system: Secondary | ICD-10-CM | POA: Diagnosis not present

## 2016-02-24 NOTE — Therapy (Signed)
[name], [place], [address] Phone: [phone]   Fax:  [phone]  Physical Therapy Treatment  Patient Details  Name: [name] MRN: [id number] Date of Birth: 05/20/1965 Referring Provider: Kenn File MD.  Encounter Date: 02/24/2016      PT End of Session - 02/24/16 1306    Visit Number 3   Number of Visits 12   Date for PT Re-Evaluation 03/29/16   PT Start Time 1302   PT Stop Time 1354   PT Time Calculation (min) 52 min   Activity Tolerance Patient tolerated treatment well   Behavior During Therapy [facility] for tasks assessed/performed      Past Medical History:  Diagnosis Date  . Anxiety   . Diabetes mellitus without complication ([place])   . Elevated heart rate and blood pressure   . Esophageal reflux     Past Surgical History:  Procedure Laterality Date  . LAPAROSCOPIC VAGINAL HYSTERECTOMY N/A 05 10 2012    There were no vitals filed for this visit.      Subjective Assessment - 02/24/16 1306    Subjective Reports that she could tell it was rainy weather coming. Reports bringing in pulley for [place] to fix. Reports 8/10 toothache sensation following exercises at home.   Patient Stated Goals Use right UE better with less pain.   Currently in Pain? Yes   Pain Score 3    Pain Location Shoulder   Pain Orientation Right   Pain Descriptors / Indicators Aching   Pain Type Acute pain   Pain Onset More than a month ago            [facility] PT Assessment - 02/24/16 0001      Assessment   Medical Diagnosis Right proximal humeral fracure.   Onset Date/Surgical Date 12/13/15     Precautions   Precaution Comments No ultrasound.     Restrictions   Weight Bearing Restrictions No                     [facility] Adult PT Treatment/Exercise - 02/24/16 0001      Shoulder Exercises: Supine   External Rotation AAROM;Right;20 reps   Flexion AAROM;Both;20 reps     Shoulder Exercises:  Pulleys   Flexion Other (comment)  x5 min   Other Pulley Exercises UE ranger flex/circles x20 reps     Shoulder Exercises: ROM/Strengthening   UBE (Upper Arm Bike) 120 RPM x 5 min     Modalities   Modalities Electrical Stimulation;Moist Heat     Moist Heat Therapy   Number Minutes Moist Heat 15 Minutes   Moist Heat Location Shoulder     Electrical Stimulation   Electrical Stimulation Location R shoulder   Electrical Stimulation Action pre-Mod   Electrical Stimulation Parameters 80-150 hz x15 min   Electrical Stimulation Goals Pain     Manual Therapy   Manual Therapy Passive ROM   Passive ROM PROM of R shoulder into flex/ER with gentle holds at end range                     PT Long Term Goals - 02/16/16 1429      PT LONG TERM GOAL #1   Title Independent with a HEP.   Time 6   Period Weeks   Status New     PT LONG TERM GOAL #2   Title Active right shoulder flexion to 145 degrees so the patient can easily  reach overhead   Time 6   Period Weeks   Status New     PT LONG TERM GOAL #3   Title Active ER to 70 degrees+ to allow for easily donning/doffing of apparel.   Time 6   Period Weeks   Status New     PT LONG TERM GOAL #4   Title Increase ROM so patient is able to reach behind back to L3.   Time 6   Period Weeks   Status New     PT LONG TERM GOAL #5   Title Perform ADL's with pain not > 3/10.   Time 6   Period Weeks   Status New               Plan - 02/24/16 1342    Clinical Impression Statement Patient presented in clinic with achey R shoulder discomfort but able to complete exercises as directed. Patient required minimal multimodal cueing for proper exercise technique. Patient reported RUE not being shakey in descent using UE ranger. Firm end feels noted with PROM of R shoulder into flexion and ER with smooth arc of motion. Normal modalities response noted following removal of the modalities.     Rehab Potential Good   PT Frequency 2x  / week   PT Duration 8 weeks   PT Treatment/Interventions ADLs/Self Care Home Management;Cryotherapy;Electrical Stimulation;Moist Heat;Patient/family education;Therapeutic activities;Therapeutic exercise;Manual techniques;Passive range of motion   PT Next Visit Plan Pulleys; UE Ranger; cane exercises; wall ladder; PROM; moist heat and e'stim.  No ultrasound.   Consulted and Agree with Plan of Care Patient      Patient will benefit from skilled therapeutic intervention in order to improve the following deficits and impairments:  Pain, Decreased activity tolerance, Decreased range of motion  Visit Diagnosis: Acute pain of right shoulder  Stiffness of right shoulder, not elsewhere classified     Problem List Patient Active Problem List   Diagnosis Date Noted  . Chest pain 07/21/2015  . Trigger finger, acquired 05/07/2015  . Gastroesophageal reflux disease without esophagitis 11/04/2014  . Essential hypertension 11/04/2014  . Depression 11/04/2014  . Generalized anxiety disorder 11/04/2014  . Annual physical exam 11/04/2014  . BMI 34.0-34.9,adult 11/04/2014  . Type 2 diabetes mellitus ([place]) 03/21/2014  . Abnormal transaminases 12/12/2012    Wynelle Fanny, PTA 02/24/2016, 2:01 PM  [facility] [address] [place], [place], [address] Phone: [phone]   Fax:  [phone]  Name: [name] MRN: [id number] Date of Birth: Nov 23, 1965

## 2016-02-29 ENCOUNTER — Ambulatory Visit: Payer: 59 | Admitting: Physical Therapy

## 2016-02-29 ENCOUNTER — Encounter: Payer: Self-pay | Admitting: Physical Therapy

## 2016-02-29 DIAGNOSIS — M25611 Stiffness of right shoulder, not elsewhere classified: Secondary | ICD-10-CM

## 2016-02-29 DIAGNOSIS — R399 Unspecified symptoms and signs involving the genitourinary system: Secondary | ICD-10-CM | POA: Diagnosis not present

## 2016-02-29 DIAGNOSIS — M25511 Pain in right shoulder: Secondary | ICD-10-CM

## 2016-02-29 NOTE — Therapy (Signed)
[facility] [place], [place], [address] Phone: [phone]   Fax:  [phone]  Physical Therapy Treatment  Patient Details  Name: [name] MRN: [id number] Date of Birth: November 08, 1965 Referring Provider: Kenn File MD.  Encounter Date: 02/29/2016      PT End of Session - 02/29/16 1304    Visit Number 4   Number of Visits 12   Date for PT Re-Evaluation 03/29/16   PT Start Time 1302   PT Stop Time 1348   PT Time Calculation (min) 46 min   Activity Tolerance Patient tolerated treatment well   Behavior During Therapy [facility] for tasks assessed/performed      Past Medical History:  Diagnosis Date  . Anxiety   . Diabetes mellitus without complication ([place])   . Elevated heart rate and blood pressure   . Esophageal reflux     Past Surgical History:  Procedure Laterality Date  . LAPAROSCOPIC VAGINAL HYSTERECTOMY N/A 05 10 2012    There were no vitals filed for this visit.      Subjective Assessment - 02/29/16 1304    Subjective Reports that she still difficulty into abduction. Reports that by herself she cannot raise her shoulder as high as we can in clinic. Reports more referred pain and achiness now more than pain.   Patient Stated Goals Use right UE better with less pain.   Currently in Pain? No/denies            [facility] PT Assessment - 02/29/16 0001      Assessment   Medical Diagnosis Right proximal humeral fracure.   Onset Date/Surgical Date 12/13/15     Precautions   Precaution Comments No ultrasound.     Restrictions   Weight Bearing Restrictions No                     [facility] Adult PT Treatment/Exercise - 02/29/16 0001      Shoulder Exercises: Supine   Protraction AAROM;Both   Protraction Limitations 3x10 reps   External Rotation AAROM;Right;20 reps   Flexion AAROM;Both   Flexion Limitations 3x10 reps     Shoulder Exercises: Pulleys   Flexion Other (comment)  x5 min    Other Pulley Exercises UE ranger flex/circles x20 reps     Shoulder Exercises: ROM/Strengthening   UBE (Upper Arm Bike) 120 RPM x 5 min   Rhythmic Stabilization, Supine R shoulder rhythmic stabilizations in ER, and flexion at 60 deg and 90 deg to promote R shoulder stability     Modalities   Modalities Electrical Stimulation;Moist Heat     Moist Heat Therapy   Number Minutes Moist Heat 15 Minutes   Moist Heat Location Shoulder     Electrical Stimulation   Electrical Stimulation Location R shoulder   Electrical Stimulation Action Pre-mod   Electrical Stimulation Parameters 80-150 hz x15 min   Electrical Stimulation Goals Pain                     PT Long Term Goals - 02/29/16 1341      PT LONG TERM GOAL #1   Title Independent with a HEP.   Time 6   Period Weeks   Status Achieved     PT LONG TERM GOAL #2   Title Active right shoulder flexion to 145 degrees so the patient can easily reach overhead   Time 6   Period Weeks   Status On-going     PT LONG  TERM GOAL #3   Title Active ER to 70 degrees+ to allow for easily donning/doffing of apparel.   Time 6   Period Weeks   Status On-going     PT LONG TERM GOAL #4   Title Increase ROM so patient is able to reach behind back to L3.   Time 6   Period Weeks   Status On-going     PT LONG TERM GOAL #5   Title Perform ADL's with pain not > 3/10.   Time 6   Period Weeks   Status Achieved               Plan - 02/29/16 1334    Clinical Impression Statement Patient arrived to treatment with no current R shoulder pain. Patient experienced R shoulder fatigue and soreness with AAROM exercises per patient experienced. Patient also reported referred pain down into R elbow and forearm with exercises. Required correction regarding AAROM R shoulder ER techinque to maintain R elbow flexion. Good R shoulder stability noted in ER and flexion at both 60 deg and 90 deg. Patient reported experiencing R shoulder fatigue  with rhythmic stabilizations. Normal modalities response noted following removal of the modalities.   Rehab Potential Good   PT Frequency 2x / week   PT Duration 8 weeks   PT Treatment/Interventions ADLs/Self Care Home Management;Cryotherapy;Electrical Stimulation;Moist Heat;Patient/family education;Therapeutic activities;Therapeutic exercise;Manual techniques;Passive range of motion   PT Next Visit Plan Continue with ROM exercises with modalities PRN per MPT POC.   Consulted and Agree with Plan of Care Patient      Patient will benefit from skilled therapeutic intervention in order to improve the following deficits and impairments:  Pain, Decreased activity tolerance, Decreased range of motion  Visit Diagnosis: Acute pain of right shoulder  Stiffness of right shoulder, not elsewhere classified     Problem List Patient Active Problem List   Diagnosis Date Noted  . Chest pain 07/21/2015  . Trigger finger, acquired 05/07/2015  . Gastroesophageal reflux disease without esophagitis 11/04/2014  . Essential hypertension 11/04/2014  . Depression 11/04/2014  . Generalized anxiety disorder 11/04/2014  . Annual physical exam 11/04/2014  . BMI 34.0-34.9,adult 11/04/2014  . Type 2 diabetes mellitus ([place]) 03/21/2014  . Abnormal transaminases 12/12/2012    Wynelle Fanny, PTA 02/29/2016, 1:54 PM  [facility] [place], [place], [address] Phone: [phone]   Fax:  [phone]  Name: [name] MRN: [id number] Date of Birth: 1966/01/16

## 2016-03-01 ENCOUNTER — Other Ambulatory Visit: Payer: Self-pay | Admitting: Family

## 2016-03-01 MED ORDER — DOXYCYCLINE HYCLATE 100 MG PO TABS: 100.0000 mg | ORAL_TABLET | Freq: Two times a day (BID) | ORAL | 0 refills | Status: DC

## 2016-03-01 NOTE — Progress Notes (Signed)
Abscess present on right breast.

## 2016-03-02 ENCOUNTER — Ambulatory Visit: Payer: 59 | Admitting: Physical Therapy

## 2016-03-07 ENCOUNTER — Ambulatory Visit: Payer: 59 | Admitting: Physical Therapy

## 2016-03-07 DIAGNOSIS — M25511 Pain in right shoulder: Secondary | ICD-10-CM

## 2016-03-07 DIAGNOSIS — M25611 Stiffness of right shoulder, not elsewhere classified: Secondary | ICD-10-CM

## 2016-03-07 DIAGNOSIS — R399 Unspecified symptoms and signs involving the genitourinary system: Secondary | ICD-10-CM | POA: Diagnosis not present

## 2016-03-07 NOTE — Therapy (Signed)
[facility] [place], [place], [address] Phone: [phone]   Fax:  [phone]  Physical Therapy Treatment  Patient Details  Name: [name] MRN: [id number] Date of Birth: Jan 26, 1966 Referring Provider: Kenn File MD.  Encounter Date: 03/07/2016      PT End of Session - 03/07/16 1342    PT Start Time 1259      Past Medical History:  Diagnosis Date  . Anxiety   . Diabetes mellitus without complication ([place])   . Elevated heart rate and blood pressure   . Esophageal reflux     Past Surgical History:  Procedure Laterality Date  . LAPAROSCOPIC VAGINAL HYSTERECTOMY N/A 05 10 2012    There were no vitals filed for this visit.      Subjective Assessment - 03/07/16 1336    Subjective My shoulder is doing a whole lot better.   Patient Stated Goals Use right UE better with less pain.   Pain Score 3    Pain Location Shoulder   Pain Orientation Right   Pain Descriptors / Indicators Aching   Pain Type Acute pain   Pain Onset More than a month ago            [facility] PT Assessment - 03/07/16 0001      AROM   Overall AROM Comments ER= 68 degrees.                     [place] Adult PT Treatment/Exercise - 03/07/16 0001      Shoulder Exercises: Standing   Other Standing Exercises Wall ladder x 4 minutes f/b doorway stretch to increase ER x 2 minutes f/b towel stretch x 2 minutes to increase behind the back range of motion.     Shoulder Exercises: ROM/Strengthening   UBE (Upper Arm Bike) 120 RPM's at 120 RPM's x 6 minutes.     Manual Therapy   Manual Therapy Passive ROM   Manual therapy comments In supine;  PROM to patient's right shoulder x 10 minutes.                PT Education - 03/07/16 1340    Education provided Yes   Person(s) Educated Patient   Methods Explanation;Demonstration;Tactile cues   Comprehension Verbalized understanding;Returned demonstration              PT Long Term Goals - 02/29/16 1341      PT LONG TERM GOAL #1   Title Independent with a HEP.   Time 6   Period Weeks   Status Achieved     PT LONG TERM GOAL #2   Title Active right shoulder flexion to 145 degrees so the patient can easily reach overhead   Time 6   Period Weeks   Status On-going     PT LONG TERM GOAL #3   Title Active ER to 70 degrees+ to allow for easily donning/doffing of apparel.   Time 6   Period Weeks   Status On-going     PT LONG TERM GOAL #4   Title Increase ROM so patient is able to reach behind back to L3.   Time 6   Period Weeks   Status On-going     PT LONG TERM GOAL #5   Title Perform ADL's with pain not > 3/10.   Time 6   Period Weeks   Status Achieved             Patient will benefit from  skilled therapeutic intervention in order to improve the following deficits and impairments:  Pain, Decreased activity tolerance, Decreased range of motion  Visit Diagnosis: Acute pain of right shoulder  Stiffness of right shoulder, not elsewhere classified     Problem List Patient Active Problem List   Diagnosis Date Noted  . Chest pain 07/21/2015  . Trigger finger, acquired 05/07/2015  . Gastroesophageal reflux disease without esophagitis 11/04/2014  . Essential hypertension 11/04/2014  . Depression 11/04/2014  . Generalized anxiety disorder 11/04/2014  . Annual physical exam 11/04/2014  . BMI 34.0-34.9,adult 11/04/2014  . Type 2 diabetes mellitus ([place]) 03/21/2014  . Abnormal transaminases 12/12/2012    Doloras [place], [place] MPT 03/07/2016, 2:39 PM  [facility] [address] [place], [place], [address] Phone: [phone]   Fax:  [phone]  Name: LORANE TOMASINO MRN: [id number] Date of Birth: 1965/10/12

## 2016-03-09 ENCOUNTER — Encounter: Payer: Self-pay | Admitting: Physical Therapy

## 2016-03-09 ENCOUNTER — Ambulatory Visit: Payer: 59 | Admitting: Physical Therapy

## 2016-03-09 DIAGNOSIS — M25511 Pain in right shoulder: Secondary | ICD-10-CM

## 2016-03-09 DIAGNOSIS — M25611 Stiffness of right shoulder, not elsewhere classified: Secondary | ICD-10-CM | POA: Diagnosis not present

## 2016-03-09 DIAGNOSIS — R399 Unspecified symptoms and signs involving the genitourinary system: Secondary | ICD-10-CM | POA: Diagnosis not present

## 2016-03-09 NOTE — Patient Instructions (Addendum)
Scapular Retraction: Bilateral    Facing anchor, pull arms back, bringing shoulder blades together. Repeat _10___ times per set. Do __2__ sets per session. Do ___2-3_ sessions per day.  http://orth.exer.us/176   Copyright  VHI. All rights reserved.  Strengthening: Resisted External Rotation    Hold tubing in right hand, elbow at side and forearm across body. Rotate forearm out. Repeat __10__ times per set. Do __2__ sets per session. Do __2-3__ sessions per day.  http://orth.exer.us/828   Copyright  VHI. All rights reserved.  Strengthening: Resisted Internal Rotation    Hold tubing in right hand, elbow at side and forearm out. Rotate forearm in across body. Repeat __10__ times per set. Do _2___ sets per session. Do __2-3__ sessions per day.  http://orth.exer.us/830   Copyright  VHI. All rights reserved.  Strengthening: Resisted Extension    Hold tubing in right hand, arm forward. Pull arm back, elbow straight. Repeat __10__ times per set. Do __2__ sets per session. Do _2-3___ sessions per day.  http://orth.exer.us/832   Copyright  VHI. All rights reserved.   

## 2016-03-09 NOTE — Therapy (Signed)
[facility] [place], [place], [address] Phone: [phone]   Fax:  [phone]  Physical Therapy Treatment  Patient Details  Name: [name] MRN: [id number] Date of Birth: 1965-05-17 Referring Provider: Kenn File MD.  Encounter Date: 03/09/2016      PT End of Session - 03/09/16 1303    Visit Number 6   Number of Visits 12   Date for PT Re-Evaluation 03/29/16   PT Start Time 1301   PT Stop Time [id number]   PT Time Calculation (min) 46 min   Activity Tolerance Patient tolerated treatment well   Behavior During Therapy [facility] for tasks assessed/performed      Past Medical History:  Diagnosis Date  . Anxiety   . Diabetes mellitus without complication ([place])   . Elevated heart rate and blood pressure   . Esophageal reflux     Past Surgical History:  Procedure Laterality Date  . LAPAROSCOPIC VAGINAL HYSTERECTOMY N/A 05 10 2012    There were no vitals filed for this visit.      Subjective Assessment - 03/09/16 1303    Subjective Reports R shoulder stiffness but overall improvement.   Patient Stated Goals Use right UE better with less pain.   Currently in Pain? Other (Comment)  Only R shoulder stiffness            [facility] PT Assessment - 03/09/16 0001      Assessment   Medical Diagnosis Right proximal humeral fracure.   Onset Date/Surgical Date 12/13/15     Precautions   Precaution Comments No ultrasound.     Restrictions   Weight Bearing Restrictions No                     [facility] Adult PT Treatment/Exercise - 03/09/16 0001      Shoulder Exercises: Supine   Horizontal ABduction Strengthening;Both;20 reps;Theraband   Theraband Level (Shoulder Horizontal ABduction) Level 1 (Yellow)   External Rotation AROM;Both;20 reps   Flexion AROM;Both;20 reps     Shoulder Exercises: Standing   External Rotation Strengthening;Right;20 reps;Theraband   Theraband Level (Shoulder External  Rotation) Level 1 ([place]ellow)   Internal Rotation Strengthening;Right;20 reps;Theraband   Theraband Level (Shoulder Internal Rotation) Level 1 (Yellow)   Extension Strengthening;Right;20 reps;Theraband   Theraband Level (Shoulder Extension) Level 1 (Yellow)   Row Strengthening;Right;20 reps;Theraband   Theraband Level (Shoulder Row) Level 1 (Yellow)   Other Standing Exercises RUE ranger flex/circles x30 reps   Other Standing Exercises R wall slides x20 reps with 2-3 sec hold      Shoulder Exercises: ROM/Strengthening   UBE (Upper Arm Bike) 90 RPM x5 min     Modalities   Modalities Electrical Stimulation;Moist Heat     Moist Heat Therapy   Number Minutes Moist Heat 15 Minutes   Moist Heat Location Shoulder     Electrical Stimulation   Electrical Stimulation Location R shoulder   Electrical Stimulation Action Pre-Mod   Electrical Stimulation Parameters 80-150 hz x15 min   Electrical Stimulation Goals Pain                PT Education - 03/09/16 1336    Education provided Yes   Education Details HEP- Resisted row, ext, ER, IR with yellow theraband    Person(s) Educated Patient   Methods Explanation;Verbal cues;Handout   Comprehension Verbalized understanding;Verbal cues required             PT Long Term Goals - 02/29/16 1341  PT LONG TERM GOAL #1   Title Independent with a HEP.   Time 6   Period Weeks   Status Achieved     PT LONG TERM GOAL #2   Title Active right shoulder flexion to 145 degrees so the patient can easily reach overhead   Time 6   Period Weeks   Status On-going     PT LONG TERM GOAL #3   Title Active ER to 70 degrees+ to allow for easily donning/doffing of apparel.   Time 6   Period Weeks   Status On-going     PT LONG TERM GOAL #4   Title Increase ROM so patient is able to reach behind back to L3.   Time 6   Period Weeks   Status On-going     PT LONG TERM GOAL #5   Title Perform ADL's with pain not > 3/10.   Time 6   Period  Weeks   Status Achieved               Plan - 03/09/16 1332    Clinical Impression Statement Patient arrived to treatment with R shoulder stiffness but no pain reports. Patient able to progress today per MPT authorization and tolerated advancements well. Patient educated with multimodal cueing for proper technique in regards to resisted shoulder strengthening. Patient did report fatigue following resisted shoulder exercises. Patient able to complete reclined AROM shoulder exercises well with complaint of fatigue following those exercises as well. Patient given new R shoulder strengthening HEP with yellow theraband with patient verbalizing understanding. Normal modalities response noted following removal of the modalities.   Rehab Potential Good   PT Frequency 2x / week   PT Duration 8 weeks   PT Treatment/Interventions ADLs/Self Care Home Management;Cryotherapy;Electrical Stimulation;Moist Heat;Patient/family education;Therapeutic activities;Therapeutic exercise;Manual techniques;Passive range of motion   PT Next Visit Plan Continue with ROM exercises with modalities PRN per MPT POC.   Consulted and Agree with Plan of Care Patient      Patient will benefit from skilled therapeutic intervention in order to improve the following deficits and impairments:  Pain, Decreased activity tolerance, Decreased range of motion  Visit Diagnosis: Acute pain of right shoulder  Stiffness of right shoulder, not elsewhere classified     Problem List Patient Active Problem List   Diagnosis Date Noted  . Chest pain 07/21/2015  . Trigger finger, acquired 05/07/2015  . Gastroesophageal reflux disease without esophagitis 11/04/2014  . Essential hypertension 11/04/2014  . Depression 11/04/2014  . Generalized anxiety disorder 11/04/2014  . Annual physical exam 11/04/2014  . BMI 34.0-34.9,adult 11/04/2014  . Type 2 diabetes mellitus ([place]) 03/21/2014  . Abnormal transaminases 12/12/2012    Wynelle Fanny, PTA 03/09/2016, 2:02 PM  [facility] [address] [place], [place], [address] Phone: [phone]   Fax:  [phone]  Name: [name] MRN: [id number] Date of Birth: Jun 29, 1965

## 2016-03-15 ENCOUNTER — Ambulatory Visit: Payer: 59 | Admitting: Physical Therapy

## 2016-03-17 ENCOUNTER — Encounter: Payer: 59 | Admitting: Physical Therapy

## 2016-03-21 ENCOUNTER — Ambulatory Visit: Payer: 59 | Admitting: Physical Therapy

## 2016-03-24 ENCOUNTER — Ambulatory Visit: Payer: 59 | Attending: Family Medicine | Admitting: Physical Therapy

## 2016-03-24 DIAGNOSIS — M25511 Pain in right shoulder: Secondary | ICD-10-CM | POA: Insufficient documentation

## 2016-03-24 DIAGNOSIS — M25611 Stiffness of right shoulder, not elsewhere classified: Secondary | ICD-10-CM | POA: Diagnosis not present

## 2016-03-24 NOTE — Therapy (Signed)
[facility] [place], [place], [address] Phone: [phone]   Fax:  [phone]  Physical Therapy Treatment  Patient Details  Name: MAIYA STERLING MRN: [id number] Date of Birth: 03-12-1966 Referring Provider: Kenn File MD.  Encounter Date: 03/24/2016      PT End of Session - 03/24/16 1411    PT Start Time 0100   PT Stop Time 0200   PT Time Calculation (min) 60 min   Activity Tolerance Patient tolerated treatment well   Behavior During Therapy [facility] for tasks assessed/performed      Past Medical History:  Diagnosis Date  . Anxiety   . Diabetes mellitus without complication ([place])   . Elevated heart rate and blood pressure   . Esophageal reflux     Past Surgical History:  Procedure Laterality Date  . LAPAROSCOPIC VAGINAL HYSTERECTOMY N/A 05 10 2012    There were no vitals filed for this visit.      Subjective Assessment - 03/24/16 1350    Subjective My shoulder got stiff since I haven't been to therapy and I did not do my exercises.   Pain Score 3    Pain Location Shoulder   Pain Orientation Right   Pain Descriptors / Indicators Aching   Pain Type Acute pain   Pain Onset More than a month ago   Pain Frequency Constant                         [facility] Adult PT Treatment/Exercise - 03/24/16 0001      Modalities   Modalities Electrical Stimulation     Moist Heat Therapy   Number Minutes Moist Heat 15 Minutes   Moist Heat Location Shoulder     Electrical Stimulation   Electrical Stimulation Location RT SHLD   Electrical Stimulation Action IFC   Electrical Stimulation Parameters 80-150 Hz x 15 minutes.   Electrical Stimulation Goals Pain                     PT Long Term Goals - 02/29/16 1341      PT LONG TERM GOAL #1   Title Independent with a HEP.   Time 6   Period Weeks   Status Achieved     PT LONG TERM GOAL #2   Title Active right shoulder flexion to 145  degrees so the patient can easily reach overhead   Time 6   Period Weeks   Status On-going     PT LONG TERM GOAL #3   Title Active ER to 70 degrees+ to allow for easily donning/doffing of apparel.   Time 6   Period Weeks   Status On-going     PT LONG TERM GOAL #4   Title Increase ROM so patient is able to reach behind back to L3.   Time 6   Period Weeks   Status On-going     PT LONG TERM GOAL #5   Title Perform ADL's with pain not > 3/10.   Time 6   Period Weeks   Status Achieved               Plan - 03/24/16 1353    Clinical Impression Statement patient's shoulder was stiffer today and lost range of motion since last seen.      Patient will benefit from skilled therapeutic intervention in order to improve the following deficits and impairments:  Pain, Decreased activity tolerance, Decreased range of motion  Visit Diagnosis: Acute pain of right shoulder  Stiffness of right shoulder, not elsewhere classified     Problem List Patient Active Problem List   Diagnosis Date Noted  . Chest pain 07/21/2015  . Trigger finger, acquired 05/07/2015  . Gastroesophageal reflux disease without esophagitis 11/04/2014  . Essential hypertension 11/04/2014  . Depression 11/04/2014  . Generalized anxiety disorder 11/04/2014  . Annual physical exam 11/04/2014  . BMI 34.0-34.9,adult 11/04/2014  . Type 2 diabetes mellitus ([place]) 03/21/2014  . Abnormal transaminases 12/12/2012    APPLEGATE, [place] MPT 03/24/2016, 2:16 PM  [facility] [place], [place], [address] Phone: [phone]   Fax:  [phone]  Name: ZAIA VALA MRN: [id number] Date of Birth: November 07, 1965

## 2016-03-24 NOTE — Therapy (Signed)
[facility] [place], [place], [address] Phone: [phone]   Fax:  [phone]  Physical Therapy Treatment  Patient Details  Name: [name] MRN: [id number] Date of Birth: [birth year] Referring Provider: Kenn File MD.  Encounter Date: 03/24/2016      PT End of Session - 03/24/16 1411    PT Start Time 0100   PT Stop Time 0200   PT Time Calculation (min) 60 min   Activity Tolerance Patient tolerated treatment well   Behavior During Therapy [facility] for tasks assessed/performed      Past Medical History:  Diagnosis Date  . Anxiety   . Diabetes mellitus without complication ([place])   . Elevated heart rate and blood pressure   . Esophageal reflux     Past Surgical History:  Procedure Laterality Date  . LAPAROSCOPIC VAGINAL HYSTERECTOMY N/A 05 10 2012    There were no vitals filed for this visit.      Subjective Assessment - 03/24/16 1350    Subjective My shoulder got stiff since I haven't been to therapy and I did not do my exercises.   Pain Score 3    Pain Location Shoulder   Pain Orientation Right   Pain Descriptors / Indicators Aching   Pain Type Acute pain   Pain Onset More than a month ago   Pain Frequency Constant                         [facility] Adult PT Treatment/Exercise - 03/24/16 0001      Modalities   Modalities Electrical Stimulation     Moist Heat Therapy   Number Minutes Moist Heat 15 Minutes   Moist Heat Location Shoulder     Electrical Stimulation   Electrical Stimulation Location RT SHLD   Electrical Stimulation Action IFC   Electrical Stimulation Parameters 80-150 Hz x 15 minutes.   Electrical Stimulation Goals Pain    **UBE x 8 minutes; Pulleys x 5 minutes; UE Ranger on wall x 5 minutes; wall ladder x 5 minutes;  PROM x 15 minutes into right shoulder flexion and ER.                 PT Long Term Goals - 02/29/16 1341      PT LONG TERM GOAL #1    Title Independent with a HEP.   Time 6   Period Weeks   Status Achieved     PT LONG TERM GOAL #2   Title Active right shoulder flexion to 145 degrees so the patient can easily reach overhead   Time 6   Period Weeks   Status On-going     PT LONG TERM GOAL #3   Title Active ER to 70 degrees+ to allow for easily donning/doffing of apparel.   Time 6   Period Weeks   Status On-going     PT LONG TERM GOAL #4   Title Increase ROM so patient is able to reach behind back to L3.   Time 6   Period Weeks   Status On-going     PT LONG TERM GOAL #5   Title Perform ADL's with pain not > 3/10.   Time 6   Period Weeks   Status Achieved               Plan - 03/24/16 1353    Clinical Impression Statement patient's shoulder was stiffer today and lost range of motion since last seen.  Patient will benefit from skilled therapeutic intervention in order to improve the following deficits and impairments:  Pain, Decreased activity tolerance, Decreased range of motion  Visit Diagnosis: Acute pain of right shoulder  Stiffness of right shoulder, not elsewhere classified     Problem List Patient Active Problem List   Diagnosis Date Noted  . Chest pain 07/21/2015  . Trigger finger, acquired 05/07/2015  . Gastroesophageal reflux disease without esophagitis 11/04/2014  . Essential hypertension 11/04/2014  . Depression 11/04/2014  . Generalized anxiety disorder 11/04/2014  . Annual physical exam 11/04/2014  . BMI 34.0-34.9,adult 11/04/2014  . Type 2 diabetes mellitus ([place]) 03/21/2014  . Abnormal transaminases 12/12/2012    Dudley Mages, [place] 03/24/2016, 2:26 PM  [facility] [address] [place], [place], [address] Phone: [phone]   Fax:  [phone]  Name: NATSHA MCKEOWN MRN: [id number] Date of Birth: 02/18/1966

## 2016-03-28 ENCOUNTER — Ambulatory Visit: Payer: 59 | Admitting: Physical Therapy

## 2016-03-28 DIAGNOSIS — M25511 Pain in right shoulder: Secondary | ICD-10-CM | POA: Diagnosis not present

## 2016-03-28 DIAGNOSIS — M25611 Stiffness of right shoulder, not elsewhere classified: Secondary | ICD-10-CM

## 2016-03-28 NOTE — Therapy (Signed)
[facility] [place], [place], [address] Phone: [phone]   Fax:  [phone]  Physical Therapy Treatment  Patient Details  Name: [name] MRN: [id number] Date of Birth: [birth year] Referring Provider: Kenn File MD.  Encounter Date: 03/28/2016      PT End of Session - 03/28/16 1301    Visit Number 8   Number of Visits 20   Date for PT Re-Evaluation 04/25/16   PT Start Time 1302   PT Stop Time 1400   PT Time Calculation (min) 58 min   Activity Tolerance Patient tolerated treatment well   Behavior During Therapy [facility] for tasks assessed/performed      Past Medical History:  Diagnosis Date  . Anxiety   . Diabetes mellitus without complication ([place])   . Elevated heart rate and blood pressure   . Esophageal reflux     Past Surgical History:  Procedure Laterality Date  . LAPAROSCOPIC VAGINAL HYSTERECTOMY N/A 05 10 2012    There were no vitals filed for this visit.      Subjective Assessment - 03/28/16 1307    Subjective Patient has no new complaints. She is back to doing her exercises.   Patient Stated Goals Use right UE better with less pain.   Currently in Pain? No/denies            [facility] PT Assessment - 03/28/16 0001      ROM / Strength   AROM / PROM / Strength AROM     AROM   AROM Assessment Site Shoulder   Right/Left Shoulder Right   Right Shoulder Flexion 126 Degrees  passive 145   Right Shoulder ABduction 115 Degrees   Right Shoulder Internal Rotation --  to T9   Right Shoulder External Rotation 26 Degrees  40 after subscap release;30 passive     Flexibility   Soft Tissue Assessment /Muscle Length --  marked tightness R subscapularis and serratus ant                     [facility] Adult PT Treatment/Exercise - 03/28/16 0001      Shoulder Exercises: Sidelying   Other Sidelying Exercises subscap release with tennis ball and IR/ER x 15     Shoulder Exercises:  Pulleys   Flexion Other (comment)  6 min: 3 each flex/scaption     Shoulder Exercises: ROM/Strengthening   UBE (Upper Arm Bike) 90 RPM x5 min     Shoulder Exercises: Stretch   Other Shoulder Stretches supine pec stretch (on and off bolster)   Other Shoulder Stretches thoracic extension over bolster     Modalities   Modalities Electrical Stimulation;Moist Heat     Moist Heat Therapy   Number Minutes Moist Heat 15 Minutes   Moist Heat Location Shoulder     Electrical Stimulation   Electrical Stimulation Location R shoulder (subscap included)    Electrical Stimulation Action IFC   Electrical Stimulation Parameters 80-150 HZ X 15 MIN   Electrical Stimulation Goals Pain     Manual Therapy   Manual Therapy Soft tissue mobilization;Myofascial release;Scapular mobilization;Passive ROM   Soft tissue mobilization to R subscapularis and posterior cuff   Myofascial Release to R subscapularis in supine   Scapular Mobilization in SDLY to R scapula   Passive ROM to R shoulder flex and IR/ER                PT Education - 03/28/16 1346    Education provided Yes  Education Details self care: TPR with tennis ball in SDLY and standing; pec stretch in supine; thoracic extension.   Person(s) Educated Patient   Methods Explanation;Demonstration   Comprehension Verbalized understanding;Returned demonstration             PT Long Term Goals - 03/28/16 1308      PT LONG TERM GOAL #1   Title Independent with a HEP.   Time 6   Period Weeks   Status Achieved     PT LONG TERM GOAL #2   Title Active right shoulder flexion to 145 degrees so the patient can easily reach overhead   Time 6   Period Weeks   Status On-going     PT LONG TERM GOAL #3   Title Active ER to 70 degrees+ to allow for easily donning/doffing of apparel.   Time 6   Period Weeks   Status On-going     PT LONG TERM GOAL #4   Title Increase ROM so patient is able to reach behind back to L3.   Time 6    Period Weeks   Status Achieved     PT LONG TERM GOAL #5   Title Perform ADL's with pain not > 3/10.   Time 6   Period Weeks   Status Achieved             Patient will benefit from skilled therapeutic intervention in order to improve the following deficits and impairments:     Visit Diagnosis: Stiffness of right shoulder, not elsewhere classified - Plan: PT plan of care cert/re-cert  Acute pain of right shoulder - Plan: PT plan of care cert/re-cert     Problem List Patient Active Problem List   Diagnosis Date Noted  . Chest pain 07/21/2015  . Trigger finger, acquired 05/07/2015  . Gastroesophageal reflux disease without esophagitis 11/04/2014  . Essential hypertension 11/04/2014  . Depression 11/04/2014  . Generalized anxiety disorder 11/04/2014  . Annual physical exam 11/04/2014  . BMI 34.0-34.9,adult 11/04/2014  . Type 2 diabetes mellitus ([place]) 03/21/2014  . Abnormal transaminases 12/12/2012    Madelyn Flavors PT 03/28/2016, 4:02 PM  [facility] [place], [place], [address] Phone: [phone]   Fax:  [phone]  Name: [name] MRN: [id number] Date of Birth: 06-19-1965

## 2016-03-28 NOTE — Patient Instructions (Signed)
Thoracic Self-Mobilization Stretch (Supine)   With small rolled towel at lower ribs level, gently lie back until stretch is felt. Extend your right arm out to stretch your pecs. Hold _60___ seconds. Relax. Repeat _3___ times per set. Do ____ sets per session. Do _2___ sessions per day.   Madelyn Flavors, PT 03/28/16 1:48 PM [facility] [address] [place], [place], [address] Phone: [phone]   Fax:  [phone]

## 2016-03-30 ENCOUNTER — Ambulatory Visit: Payer: 59 | Admitting: Physical Therapy

## 2016-03-30 ENCOUNTER — Encounter: Payer: Self-pay | Admitting: Physical Therapy

## 2016-03-30 DIAGNOSIS — M25511 Pain in right shoulder: Secondary | ICD-10-CM | POA: Diagnosis not present

## 2016-03-30 DIAGNOSIS — M25611 Stiffness of right shoulder, not elsewhere classified: Secondary | ICD-10-CM | POA: Diagnosis not present

## 2016-03-30 NOTE — Therapy (Signed)
[facility] [place], [place], [address] Phone: [phone]   Fax:  [phone]  Physical Therapy Treatment  Patient Details  Name: [name] MRN: [id number] Date of Birth: 05-06-1965 Referring Provider: Kenn File MD.  Encounter Date: 03/30/2016      PT End of Session - 03/30/16 1400    Visit Number 9   Number of Visits 20   Date for PT Re-Evaluation 04/25/16   PT Start Time [id number]   PT Stop Time 1415   PT Time Calculation (min) 58 min   Activity Tolerance Patient tolerated treatment well   Behavior During Therapy [facility] for tasks assessed/performed      Past Medical History:  Diagnosis Date  . Anxiety   . Diabetes mellitus without complication ([place])   . Elevated heart rate and blood pressure   . Esophageal reflux     Past Surgical History:  Procedure Laterality Date  . LAPAROSCOPIC VAGINAL HYSTERECTOMY N/A 05 10 2012    There were no vitals filed for this visit.      Subjective Assessment - 03/30/16 1326    Subjective Patient has reported ongoing stiffness in right shoulder yet no pain   Patient Stated Goals Use right UE better with less pain.   Currently in Pain? No/denies            [facility] PT Assessment - 03/30/16 0001      ROM / Strength   AROM / PROM / Strength AROM;PROM     AROM   AROM Assessment Site Shoulder   Right/Left Shoulder Right   Right Shoulder Flexion 110 Degrees   Right Shoulder External Rotation 55 Degrees     PROM   PROM Assessment Site Shoulder   Right/Left Shoulder Right   Right Shoulder Flexion 125 Degrees   Right Shoulder External Rotation 64 Degrees                     [facility] Adult PT Treatment/Exercise - 03/30/16 0001      Shoulder Exercises: Standing   Other Standing Exercises wall slide with eccentric lowering 2x10     Shoulder Exercises: Pulleys   Flexion Other (comment)  1min     Shoulder Exercises: ROM/Strengthening   UBE (Upper  Arm Bike) 90 RPM x6 min     Moist Heat Therapy   Number Minutes Moist Heat 15 Minutes   Moist Heat Location Shoulder     Electrical Stimulation   Electrical Stimulation Location R shoulder (subscap included)    Electrical Stimulation Action IFC   Electrical Stimulation Parameters 80-150hz  x70min   Electrical Stimulation Goals Pain     Manual Therapy   Manual Therapy Passive ROM   Passive ROM manual PROM to R shoulder flex and IR/ER with gentle holds                     PT Long Term Goals - 03/30/16 1335      PT LONG TERM GOAL #1   Title Independent with a HEP.   Time 6   Period Weeks   Status Achieved     PT LONG TERM GOAL #2   Title Active right shoulder flexion to 145 degrees so the patient can easily reach overhead   Time 6   Period Weeks   Status On-going  AROM 110 degrees 03/30/16     PT LONG TERM GOAL #3   Title Active ER to 70 degrees+ to allow for easily  donning/doffing of apparel.   Period Weeks   Status On-going  AROM 55 degrees 03/30/16     PT LONG TERM GOAL #4   Title Increase ROM so patient is able to reach behind back to L3.   Time 6   Period Weeks   Status Achieved     PT LONG TERM GOAL #5   Title Perform ADL's with pain not > 3/10.   Period Weeks   Status Achieved               Plan - 03/30/16 1401    Clinical Impression Statement Patient tolerated treatment well today and had no pain except with ROM some soreness when performing ROM for flexion the eccentric lowering back down to a resting position. Patient has increased muscle toghtness ang guarding and required cues and ossilations to help relax. Patient improved with ROM yet unable to meet any further goals due to ROM deficits.   Rehab Potential Good   PT Frequency 2x / week   PT Duration 8 weeks   PT Treatment/Interventions ADLs/Self Care Home Management;Cryotherapy;Electrical Stimulation;Moist Heat;Patient/family education;Therapeutic activities;Therapeutic  exercise;Manual techniques;Passive range of motion   PT Next Visit Plan Continue with ROM exercises/DN/ modalities PRN per MPT POC.   Consulted and Agree with Plan of Care Patient      Patient will benefit from skilled therapeutic intervention in order to improve the following deficits and impairments:  Pain, Decreased activity tolerance, Decreased range of motion  Visit Diagnosis: Stiffness of right shoulder, not elsewhere classified  Acute pain of right shoulder     Problem List Patient Active Problem List   Diagnosis Date Noted  . Chest pain 07/21/2015  . Trigger finger, acquired 05/07/2015  . Gastroesophageal reflux disease without esophagitis 11/04/2014  . Essential hypertension 11/04/2014  . Depression 11/04/2014  . Generalized anxiety disorder 11/04/2014  . Annual physical exam 11/04/2014  . BMI 34.0-34.9,adult 11/04/2014  . Type 2 diabetes mellitus ([place]) 03/21/2014  . Abnormal transaminases 12/12/2012    Marquisha Nikolov P, PTA 03/30/2016, 2:16 PM  [facility] [place], [place], [address] Phone: [phone]   Fax:  [phone]  Name: [name] MRN: [id number] Date of Birth: [birth year]

## 2016-03-31 DIAGNOSIS — Z6835 Body mass index (BMI) 35.0-35.9, adult: Secondary | ICD-10-CM | POA: Diagnosis not present

## 2016-03-31 DIAGNOSIS — N390 Urinary tract infection, site not specified: Secondary | ICD-10-CM | POA: Diagnosis not present

## 2016-03-31 DIAGNOSIS — Z01419 Encounter for gynecological examination (general) (routine) without abnormal findings: Secondary | ICD-10-CM | POA: Diagnosis not present

## 2016-04-01 ENCOUNTER — Other Ambulatory Visit: Payer: Self-pay | Admitting: Nurse Practitioner

## 2016-04-01 ENCOUNTER — Other Ambulatory Visit: Payer: Self-pay

## 2016-04-01 DIAGNOSIS — F3342 Major depressive disorder, recurrent, in full remission: Secondary | ICD-10-CM

## 2016-04-01 MED ORDER — ESTRADIOL 0.1 MG/24HR TD PTTW: 1.0000 | MEDICATED_PATCH | TRANSDERMAL | 1 refills | Status: DC

## 2016-04-01 MED ORDER — SERTRALINE HCL 100 MG PO TABS: 100.0000 mg | ORAL_TABLET | Freq: Every day | ORAL | 1 refills | Status: DC

## 2016-04-01 MED ORDER — SERTRALINE HCL 100 MG PO TABS: ORAL_TABLET | ORAL | 1 refills | Status: DC

## 2016-04-01 MED FILL — ESTRADIOL 0.1 MG PATCH: 0.1 | 84 days supply | Qty: 24 | Fill #0

## 2016-04-01 MED FILL — SERTRALINE HCL 100 MG TAB: 100 | 90 days supply | Qty: 135 | Fill #0

## 2016-04-04 ENCOUNTER — Ambulatory Visit: Payer: 59

## 2016-04-05 ENCOUNTER — Ambulatory Visit: Payer: 59 | Admitting: Physical Therapy

## 2016-04-05 DIAGNOSIS — M25611 Stiffness of right shoulder, not elsewhere classified: Secondary | ICD-10-CM | POA: Diagnosis not present

## 2016-04-05 DIAGNOSIS — M25511 Pain in right shoulder: Secondary | ICD-10-CM | POA: Diagnosis not present

## 2016-04-05 NOTE — Therapy (Signed)
[facility] [place], [place], [address] Phone: [phone]   Fax:  [phone]  Physical Therapy Treatment  Patient Details  Name: [name] MRN: [id number] Date of Birth: [birth year] Referring Provider: Kenn File MD.  Encounter Date: 04/05/2016      PT End of Session - 04/05/16 1418    Visit Number 10   Number of Visits 20   Date for PT Re-Evaluation 04/25/16   PT Start Time 0100   PT Stop Time 0151   PT Time Calculation (min) 51 min   Activity Tolerance Patient tolerated treatment well   Behavior During Therapy [facility] for tasks assessed/performed      Past Medical History:  Diagnosis Date  . Anxiety   . Diabetes mellitus without complication ([place])   . Elevated heart rate and blood pressure   . Esophageal reflux     Past Surgical History:  Procedure Laterality Date  . LAPAROSCOPIC VAGINAL HYSTERECTOMY N/A 05 10 2012    There were no vitals filed for this visit.      Subjective Assessment - 04/05/16 1419    Subjective Doing goo dtoday.   Patient Stated Goals Use right UE better with less pain.   Pain Score 3    Pain Location Shoulder   Pain Orientation Right   Pain Descriptors / Indicators Aching   Pain Type Acute pain   Pain Onset More than a month ago                         [facility] Adult PT Treatment/Exercise - 04/05/16 0001      Shoulder Exercises: Pulleys   Flexion Limitations 5 minutes.   Other Pulley Exercises UE Ranger on wall x 5 minutes.   Other Pulley Exercises Wall ladder x 5 minutes f/b doorway stretch x 16minute f/b towel stretch x 1 minute.     Modalities   Modalities Electrical Stimulation     Moist Heat Therapy   Number Minutes Moist Heat 15 Minutes   Moist Heat Location --  Right shoulder.     Acupuncturist Location Right shoulder.   Electrical Stimulation Action IFC   Electrical Stimulation Parameters 80-150 Hz  x 15 minutes.   Electrical Stimulation Goals Pain     Manual Therapy   Manual Therapy Passive ROM   Passive ROM 10 minutes of right shoulder PROM into right shoulder flexion and ER.                     PT Long Term Goals - 03/30/16 1335      PT LONG TERM GOAL #1   Title Independent with a HEP.   Time 6   Period Weeks   Status Achieved     PT LONG TERM GOAL #2   Title Active right shoulder flexion to 145 degrees so the patient can easily reach overhead   Time 6   Period Weeks   Status On-going  AROM 110 degrees 03/30/16     PT LONG TERM GOAL #3   Title Active ER to 70 degrees+ to allow for easily donning/doffing of apparel.   Period Weeks   Status On-going  AROM 55 degrees 03/30/16     PT LONG TERM GOAL #4   Title Increase ROM so patient is able to reach behind back to L3.   Time 6   Period Weeks   Status Achieved  PT LONG TERM GOAL #5   Title Perform ADL's with pain not > 3/10.   Period Weeks   Status Achieved               Plan - 04/05/16 1426    Clinical Impression Statement Right shoulder ROM much improved this week.      Patient will benefit from skilled therapeutic intervention in order to improve the following deficits and impairments:     Visit Diagnosis: Stiffness of right shoulder, not elsewhere classified  Acute pain of right shoulder     Problem List Patient Active Problem List   Diagnosis Date Noted  . Chest pain 07/21/2015  . Trigger finger, acquired 05/07/2015  . Gastroesophageal reflux disease without esophagitis 11/04/2014  . Essential hypertension 11/04/2014  . Depression 11/04/2014  . Generalized anxiety disorder 11/04/2014  . Annual physical exam 11/04/2014  . BMI 34.0-34.9,adult 11/04/2014  . Type 2 diabetes mellitus ([place]) 03/21/2014  . Abnormal transaminases 12/12/2012    Jahquez Steffler, [place] 04/05/2016, 2:30 PM  [facility] [address] [place], [place],  [address] Phone: [phone]   Fax:  [phone]  Name: TERIANN BOLLE MRN: [id number] Date of Birth: [birth year]

## 2016-04-07 ENCOUNTER — Ambulatory Visit: Payer: 59 | Admitting: Physical Therapy

## 2016-04-07 ENCOUNTER — Encounter: Payer: Self-pay | Admitting: Physical Therapy

## 2016-04-07 DIAGNOSIS — M25611 Stiffness of right shoulder, not elsewhere classified: Secondary | ICD-10-CM

## 2016-04-07 DIAGNOSIS — M25511 Pain in right shoulder: Secondary | ICD-10-CM

## 2016-04-07 NOTE — Therapy (Signed)
[facility] [place], [place], [address] Phone: [phone]   Fax:  [phone]  Physical Therapy Treatment  Patient Details  Name: [name] MRN: [id number] Date of Birth: 11-Apr-1966 Referring Provider: Kenn File MD.  Encounter Date: 04/07/2016      PT End of Session - 04/07/16 1342    Visit Number 11   Number of Visits 20   Date for PT Re-Evaluation 04/25/16   PT Start Time 1314   PT Stop Time 1409   PT Time Calculation (min) 55 min   Activity Tolerance Patient tolerated treatment well   Behavior During Therapy [facility] for tasks assessed/performed      Past Medical History:  Diagnosis Date  . Anxiety   . Diabetes mellitus without complication ([place])   . Elevated heart rate and blood pressure   . Esophageal reflux     Past Surgical History:  Procedure Laterality Date  . LAPAROSCOPIC VAGINAL HYSTERECTOMY N/A 05 10 2012    There were no vitals filed for this visit.      Subjective Assessment - 04/07/16 1318    Subjective patient reported feeling more movement in shoulder and some ongoing soreness   Patient Stated Goals Use right UE better with less pain.   Currently in Pain? Yes   Pain Score 3    Pain Location Shoulder   Pain Orientation Right   Pain Descriptors / Indicators Aching   Pain Type Acute pain   Pain Onset More than a month ago   Pain Frequency Constant   Aggravating Factors  certain movements   Pain Relieving Factors at rest            [facility] PT Assessment - 04/07/16 0001      ROM / Strength   AROM / PROM / Strength PROM;AROM     AROM   AROM Assessment Site Shoulder   Right/Left Shoulder Right   Right Shoulder Flexion 135 Degrees   Right Shoulder External Rotation 53 Degrees     PROM   PROM Assessment Site Shoulder   Right/Left Shoulder Right   Right Shoulder Flexion 150 Degrees   Right Shoulder External Rotation 75 Degrees                     [facility]  Adult PT Treatment/Exercise - 04/07/16 0001      Shoulder Exercises: Standing   Protraction Strengthening;Left;Theraband  3x10   Theraband Level (Shoulder Protraction) Level 1 (Yellow)   External Rotation Strengthening;Right;Theraband  3x10   Theraband Level (Shoulder External Rotation) Level 1 (Yellow)   Internal Rotation Strengthening;Right;Theraband  3x10   Theraband Level (Shoulder Internal Rotation) Level 1 (Yellow)   Row Strengthening;Right;Theraband  3x10   Theraband Level (Shoulder Row) Level 1 (Yellow)     Shoulder Exercises: Pulleys   Flexion Other (comment)  52min   Other Pulley Exercises UE ranger for elevation and circles x69min   Other Pulley Exercises wall ladder x57min     Moist Heat Therapy   Number Minutes Moist Heat 15 Minutes   Moist Heat Location Shoulder     Electrical Stimulation   Electrical Stimulation Location Right shoulder.   Electrical Stimulation Action IFC   Electrical Stimulation Parameters 80-150hz  x52min   Electrical Stimulation Goals Pain     Manual Therapy   Manual Therapy Passive ROM   Passive ROM manual  PROM into right shoulder flexion and ER., rhythmic stabs for all directions  PT Long Term Goals - 04/07/16 1345      PT LONG TERM GOAL #1   Title Independent with a HEP.   Time 6   Period Weeks   Status Achieved     PT LONG TERM GOAL #2   Title Active right shoulder flexion to 145 degrees so the patient can easily reach overhead   Time 6   Period Weeks   Status On-going  AROM 135 degrees 04/07/16     PT LONG TERM GOAL #3   Title Active ER to 70 degrees+ to allow for easily donning/doffing of apparel.   Time 6   Period Weeks   Status On-going  AROM 53 degrees 04/07/16     PT LONG TERM GOAL #4   Title Increase ROM so patient is able to reach behind back to L3.   Time 6   Period Weeks   Status Achieved     PT LONG TERM GOAL #5   Title Perform ADL's with pain not > 3/10.   Time 6    Period Weeks   Status Achieved               Plan - 04/07/16 1350    Clinical Impression Statement Patient tolerated treatment well today. Patient has full PROM in right shoulder. Patient has limitations with AROM due to pain and strength deficts. Patient able to continue strengthening per MPT today with no difficulty or pain complaints. Current goals ongoing due to pain and strength deficts.   Rehab Potential Good   PT Frequency 2x / week   PT Duration 8 weeks   PT Treatment/Interventions ADLs/Self Care Home Management;Cryotherapy;Electrical Stimulation;Moist Heat;Patient/family education;Therapeutic activities;Therapeutic exercise;Manual techniques;Passive range of motion   PT Next Visit Plan cont with POC per MPT   Consulted and Agree with Plan of Care Patient      Patient will benefit from skilled therapeutic intervention in order to improve the following deficits and impairments:  Pain, Decreased activity tolerance, Decreased range of motion  Visit Diagnosis: Stiffness of right shoulder, not elsewhere classified  Acute pain of right shoulder     Problem List Patient Active Problem List   Diagnosis Date Noted  . Chest pain 07/21/2015  . Trigger finger, acquired 05/07/2015  . Gastroesophageal reflux disease without esophagitis 11/04/2014  . Essential hypertension 11/04/2014  . Depression 11/04/2014  . Generalized anxiety disorder 11/04/2014  . Annual physical exam 11/04/2014  . BMI 34.0-34.9,adult 11/04/2014  . Type 2 diabetes mellitus ([place]) 03/21/2014  . Abnormal transaminases 12/12/2012    DUNFORD, CHRISTINA P, PTA 04/07/2016, 2:15 PM  [facility] [place], [place], [address] Phone: [phone]   Fax:  [phone]  Name: [name] MRN: [id number] Date of Birth: 02/16/1966

## 2016-04-13 ENCOUNTER — Other Ambulatory Visit: Payer: Self-pay

## 2016-04-13 MED ORDER — METFORMIN HCL ER 500 MG PO TB24: 1000.0000 mg | ORAL_TABLET | Freq: Every day | ORAL | 0 refills | Status: DC

## 2016-04-13 MED FILL — METFORMIN HCL ER 500 MG TAB: 500 | 90 days supply | Qty: 180 | Fill #0

## 2016-04-14 ENCOUNTER — Ambulatory Visit: Payer: 59 | Admitting: Physical Therapy

## 2016-04-14 DIAGNOSIS — M25511 Pain in right shoulder: Secondary | ICD-10-CM

## 2016-04-14 DIAGNOSIS — M25611 Stiffness of right shoulder, not elsewhere classified: Secondary | ICD-10-CM

## 2016-04-14 NOTE — Addendum Note (Signed)
Addended by: APPLEGATE, [place] W on: 04/14/2016 05:59 PM   Modules accepted: Orders

## 2016-04-14 NOTE — Therapy (Addendum)
[facility] [place], [place], [address] Phone: [phone]   Fax:  [phone]  Physical Therapy Treatment  Patient Details  Name: [name] MRN: [id number] Date of Birth: 03-15-1966 Referring Provider: Kenn File MD.  Encounter Date: 04/14/2016      PT End of Session - 04/14/16 1728    Visit Number 12   Number of Visits 20   Date for PT Re-Evaluation 05/26/16   PT Start Time 0103   PT Stop Time 0149   PT Time Calculation (min) 46 min   Activity Tolerance Patient tolerated treatment well   Behavior During Therapy [facility] for tasks assessed/performed      Past Medical History:  Diagnosis Date  . Anxiety   . Diabetes mellitus without complication ([place])   . Elevated heart rate and blood pressure   . Esophageal reflux     Past Surgical History:  Procedure Laterality Date  . LAPAROSCOPIC VAGINAL HYSTERECTOMY N/A 05 10 2012    There were no vitals filed for this visit.      Subjective Assessment - 04/14/16 1730    Subjective Shoulder is hurting today.   Pain Score 5    Pain Location Shoulder   Pain Orientation Right   Pain Descriptors / Indicators Aching   Pain Type Acute pain     Treatment:  UBE x 8 minutes; Pulleys x 5 minutes; 2 minutes sustained right shoulder ER stretch f/b Combo e'stim/U/S at 1.50 w/CM2 x 12 minutes to right middle deltoid region f/b STW/M x 11 minutes.  The patient felt better after treatment.                                 PT Long Term Goals - 04/07/16 1345      PT LONG TERM GOAL #1   Title Independent with a HEP.   Time 6   Period Weeks   Status Achieved     PT LONG TERM GOAL #2   Title Active right shoulder flexion to 145 degrees so the patient can easily reach overhead   Time 6   Period Weeks   Status On-going  AROM 135 degrees 04/07/16     PT LONG TERM GOAL #3   Title Active ER to 70 degrees+ to allow for easily donning/doffing  of apparel.   Time 6   Period Weeks   Status On-going  AROM 53 degrees 04/07/16     PT LONG TERM GOAL #4   Title Increase ROM so patient is able to reach behind back to L3.   Time 6   Period Weeks   Status Achieved     PT LONG TERM GOAL #5   Title Perform ADL's with pain not > 3/10.   Time 6   Period Weeks   Status Achieved               Plan - 04/14/16 1731    Clinical Impression Statement The patient has made overall excellent progress and would like to continue physical therapy at this time.  She had a 16 day interruption in therapy and this caused a set back but she is coming back from this with a consisitent improvement in her range of motion.   PT Treatment/Interventions ADLs/Self Care Home Management;Cryotherapy;Electrical Stimulation;Moist Heat;Patient/family education;Therapeutic activities;Therapeutic exercise;Manual techniques;Passive range of motion   Consulted and Agree with Plan of Care Patient      Patient  will benefit from skilled therapeutic intervention in order to improve the following deficits and impairments:  Pain, Decreased activity tolerance, Decreased range of motion  Visit Diagnosis: Stiffness of right shoulder, not elsewhere classified - Plan: PT plan of care cert/re-cert  Acute pain of right shoulder - Plan: PT plan of care cert/re-cert     Problem List Patient Active Problem List   Diagnosis Date Noted  . Chest pain 07/21/2015  . Trigger finger, acquired 05/07/2015  . Gastroesophageal reflux disease without esophagitis 11/04/2014  . Essential hypertension 11/04/2014  . Depression 11/04/2014  . Generalized anxiety disorder 11/04/2014  . Annual physical exam 11/04/2014  . BMI 34.0-34.9,adult 11/04/2014  . Type 2 diabetes mellitus ([place]) 03/21/2014  . Abnormal transaminases 12/12/2012    Ellianna Ruest, [place] MPT 04/14/2016, 6:01 PM  [facility] [address] [place], [place],  [address] Phone: [phone]   Fax:  [phone]  Name: [name] MRN: [id number] Date of Birth: 1965/05/29   PHYSICAL THERAPY DISCHARGE SUMMARY  Visits from Start of Care: 12.  Current functional level related to goals / functional outcomes: See above.   Remaining deficits: Loss of right shoulder ROM and remaining pain.   Education / Equipment: HEP. Plan: Patient agrees to discharge.  Patient goals were partially met. Patient is being discharged due to not returning since the last visit.  ?????         [place] MPT

## 2016-04-20 ENCOUNTER — Ambulatory Visit: Payer: 59 | Admitting: Physical Therapy

## 2016-05-02 ENCOUNTER — Ambulatory Visit (INDEPENDENT_AMBULATORY_CARE_PROVIDER_SITE_OTHER): Payer: 59 | Admitting: Family Medicine

## 2016-05-02 ENCOUNTER — Encounter: Payer: Self-pay | Admitting: Family Medicine

## 2016-05-02 VITALS — BP 127/81 | HR 86 | Temp 97.9°F | Ht 61.0 in | Wt 185.0 lb

## 2016-05-02 DIAGNOSIS — J019 Acute sinusitis, unspecified: Secondary | ICD-10-CM | POA: Diagnosis not present

## 2016-05-02 LAB — VERITOR FLU A/B WAIVED
Influenza A: NEGATIVE
Influenza B: NEGATIVE

## 2016-05-02 MED ORDER — HYDROCODONE-HOMATROPINE 5-1.5 MG/5ML PO SYRP: 5.0000 mL | ORAL_SOLUTION | Freq: Four times a day (QID) | ORAL | 0 refills | Status: DC | PRN

## 2016-05-02 NOTE — Addendum Note (Signed)
Addended by: Jamelle Haring on: 05/02/2016 03:32 PM   Modules accepted: Orders

## 2016-05-02 NOTE — Progress Notes (Signed)
BP 127/81   Pulse 86   Temp 97.9 F (36.6 C) (Oral)   Ht 5\' 1"  (1.549 m)   Wt 185 lb (83.9 kg)   BMI 34.96 kg/m    Subjective:    Patient ID: [name], female    DOB: [birth year], 51 y.o.   MRN: [id number]  HPI: [name] is a 51 y.o. female presenting on 05/02/2016 for Cough (non productive, no known fever, started w/ head congestion now chest congestion, started last Thursday, )   HPI Cough Patient has been having cough that is nonproductive and had some head congestion and chest congestion. She says the cough is worse at night and keeping her up. She said the cough started 5 days ago and has been worse. She denies any shortness of breath or wheezing or fevers or chills. She denies any sick contacts that she knows of but she did just take a trip to [place] with some friends recently and was at [facility] state with her daughter recently as well.  Relevant past medical, surgical, family and social history reviewed and updated as indicated. Interim medical history since our last visit reviewed. Allergies and medications reviewed and updated.  Review of Systems  Constitutional: Negative for chills and fever.  HENT: Positive for congestion, postnasal drip, sinus pressure, sneezing and sore throat. Negative for ear discharge, ear pain and rhinorrhea.   Eyes: Negative for pain, redness and visual disturbance.  Respiratory: Positive for cough. Negative for chest tightness and shortness of breath.   Cardiovascular: Negative for chest pain and leg swelling.  Genitourinary: Negative for difficulty urinating and dysuria.  Musculoskeletal: Negative for back pain and gait problem.  Skin: Negative for rash.  Neurological: Negative for light-headedness and headaches.  Psychiatric/Behavioral: Negative for agitation and behavioral problems.  All other systems reviewed and are negative.   Per HPI unless specifically indicated above     Objective:    BP 127/81   Pulse 86    Temp 97.9 F (36.6 C) (Oral)   Ht 5\' 1"  (1.549 m)   Wt 185 lb (83.9 kg)   BMI 34.96 kg/m   Wt Readings from Last 3 Encounters:  05/02/16 185 lb (83.9 kg)  01/26/16 185 lb (83.9 kg)  12/15/15 185 lb (83.9 kg)    Physical Exam  Constitutional: She is oriented to person, place, and time. She appears well-developed and well-nourished. No distress.  HENT:  Right Ear: Tympanic membrane, external ear and ear canal normal.  Left Ear: Tympanic membrane, external ear and ear canal normal.  Nose: Mucosal edema and rhinorrhea present. No epistaxis. Right sinus exhibits no maxillary sinus tenderness and no frontal sinus tenderness. Left sinus exhibits no maxillary sinus tenderness and no frontal sinus tenderness.  Mouth/Throat: Uvula is midline and mucous membranes are normal. Posterior oropharyngeal edema and posterior oropharyngeal erythema present. No oropharyngeal exudate or tonsillar abscesses.  Eyes: Conjunctivae are normal.  Cardiovascular: Normal rate, regular rhythm, normal heart sounds and intact distal pulses.   No murmur heard. Pulmonary/Chest: Effort normal and breath sounds normal. No respiratory distress. She has no wheezes. She has no rales.  Musculoskeletal: Normal range of motion. She exhibits no edema or tenderness.  Neurological: She is alert and oriented to person, place, and time. Coordination normal.  Skin: Skin is warm and dry. No rash noted. She is not diaphoretic.  Psychiatric: She has a normal mood and affect. Her behavior is normal.  Vitals reviewed.     Assessment & Plan:  Problem List Items Addressed This Visit    None    Visit Diagnoses    Acute rhinosinusitis    -  Primary   Relevant Medications   HYDROcodone-homatropine (HYCODAN) 5-1.5 MG/5ML syrup       Follow up plan: Return if symptoms worsen or fail to improve.  Counseling provided for all of the vaccine components No orders of the defined types were placed in this encounter.   Caryl Pina, MD [facility] 05/02/2016, 3:08 PM

## 2016-05-03 ENCOUNTER — Encounter: Payer: Self-pay | Admitting: Physician Assistant

## 2016-05-03 ENCOUNTER — Ambulatory Visit (INDEPENDENT_AMBULATORY_CARE_PROVIDER_SITE_OTHER): Payer: 59 | Admitting: Physician Assistant

## 2016-05-03 ENCOUNTER — Other Ambulatory Visit: Payer: Self-pay | Admitting: *Deleted

## 2016-05-03 VITALS — BP 128/74 | HR 80 | Temp 97.7°F | Ht 61.0 in | Wt 185.0 lb

## 2016-05-03 DIAGNOSIS — I10 Essential (primary) hypertension: Secondary | ICD-10-CM

## 2016-05-03 DIAGNOSIS — N611 Abscess of the breast and nipple: Secondary | ICD-10-CM | POA: Diagnosis not present

## 2016-05-03 DIAGNOSIS — N644 Mastodynia: Secondary | ICD-10-CM

## 2016-05-03 MED ORDER — DOXYCYCLINE HYCLATE 100 MG PO TABS: 100.0000 mg | ORAL_TABLET | Freq: Two times a day (BID) | ORAL | 0 refills | Status: DC

## 2016-05-03 MED ORDER — LISINOPRIL 20 MG PO TABS: 20.0000 mg | ORAL_TABLET | Freq: Every day | ORAL | 3 refills | Status: DC

## 2016-05-03 NOTE — Patient Instructions (Signed)
Hidradenitis Suppurativa Introduction Hidradenitis suppurativa is a long-term (chronic) skin disease that starts with blocked sweat glands or hair follicles. Bacteria may grow in these blocked openings of your skin. Hidradenitis suppurativa is like a severe form of acne that develops in areas of your body where acne would be unusual. It is most likely to affect the areas of your body where skin rubs against skin and becomes moist. This includes your:  Underarms.  Groin.  Genital areas.  Buttocks.  Upper thighs.  Breasts. Hidradenitis suppurativa may start out with small pimples. The pimples can develop into deep sores that break open (rupture) and drain pus. Over time your skin may thicken and become scarred. Hidradenitis suppurativa cannot be passed from person to person. What are the causes? The exact cause of hidradenitis suppurativa is not known. This condition may be due to:  Female and female hormones. The condition is rare before and after puberty.  An overactive body defense system (immune system). Your immune system may overreact to the blocked hair follicles or sweat glands and cause swelling and pus-filled sores. What increases the risk? You may have a higher risk of hidradenitis suppurativa if you:  Are a woman.  Are between ages 11 and 55.  Have a family history of hidradenitis suppurativa.  Have a personal history of acne.  Are overweight.  Smoke.  Take the drug lithium. What are the signs or symptoms? The first signs of an outbreak are usually painful skin bumps that look like pimples. As the condition progresses:  Skin bumps may get bigger and grow deeper into the skin.  Bumps under the skin may rupture and drain smelly pus.  Skin may become itchy and infected.  Skin may thicken and scar.  Drainage may continue through tunnels under the skin (fistulas).  Walking and moving your arms can become painful. How is this diagnosed? Your health care provider  may diagnose hidradenitis suppurativa based on your medical history and your signs and symptoms. A physical exam will also be done. You may need to see a health care provider who specializes in skin diseases (dermatologist). You may also have tests done to confirm the diagnosis. These can include:  Swabbing a sample of pus or drainage from your skin so it can be sent to the lab and tested for infection.  Blood tests to check for infection. How is this treated? The same treatment will not work for everybody with hidradenitis suppurativa. Your treatment will depend on how severe your symptoms are. You may need to try several treatments to find what works best for you. Part of your treatment may include cleaning and bandaging (dressing) your wounds. You may also have to take medicines, such as the following:  Antibiotics.  Acne medicines.  Medicines to block or suppress the immune system.  A diabetes medicine (metformin) is sometimes used to treat this condition.  For women, birth control pills can sometimes help relieve symptoms. You may need surgery if you have a severe case of hidradenitis suppurativa that does not respond to medicine. Surgery may involve:  Using a laser to clear the skin and remove hair follicles.  Opening and draining deep sores.  Removing the areas of skin that are diseased and scarred. Follow these instructions at home:  Learn as much as you can about your disease, and work closely with your health care providers.  Take medicines only as directed by your health care provider.  If you were prescribed an antibiotic medicine, finish it all   even if you start to feel better.  If you are overweight, losing weight may be very helpful. Try to reach and maintain a healthy weight.  Do not use any tobacco products, including cigarettes, chewing tobacco, or electronic cigarettes. If you need help quitting, ask your health care provider.  Do not shave the areas where you  get hidradenitis suppurativa.  Do not wear deodorant.  Wear loose-fitting clothes.  Try not to overheat and get sweaty.  Take a daily bleach bath as directed by your health care provider.  Fill your bathtub halfway with water.  Pour in  cup of unscented household bleach.  Soak for 5-10 minutes.  Cover sore areas with a warm, clean washcloth (compress) for 5-10 minutes. Contact a health care provider if:  You have a flare-up of hidradenitis suppurativa.  You have chills or a fever.  You are having trouble controlling your symptoms at home. This information is not intended to replace advice given to you by your health care provider. Make sure you discuss any questions you have with your health care provider. Document Released: 11/17/2003 Document Revised: 09/10/2015 Document Reviewed: 07/05/2013  2017 Elsevier  

## 2016-05-04 MED FILL — LISINOPRIL 20 MG TABLET: 20 | 90 days supply | Qty: 90 | Fill #0

## 2016-05-05 DIAGNOSIS — N611 Abscess of the breast and nipple: Secondary | ICD-10-CM | POA: Insufficient documentation

## 2016-05-05 NOTE — Progress Notes (Signed)
BP 128/74   Pulse 80   Temp 97.7 F (36.5 C) (Oral)   Ht 5\' 1"  (1.549 m)   Wt 185 lb (83.9 kg)   BMI 34.96 kg/m    Subjective:    Patient ID: [name], female    DOB: 02/18/1966, 51 y.o.   MRN: [id number]  HPI: [name] is a 51 y.o. female presenting on 05/03/2016 for burning in right breast  In the past couple days patient has had increasing pain in the right breast. She stated it started last week while she was on vacation. There is no specific activity or injury that caused it. The pain hurts no matter what activity she is doing. This primarily to the medial lower portion of the breast. She does have some skin changes there. She has had abscesses in the past. Patient ID has a mammogram appointment for later in the month. I've encouraged her to keep this.  Relevant past medical, surgical, family and social history reviewed and updated as indicated. Allergies and medications reviewed and updated.  Past Medical History:  Diagnosis Date  . Anxiety   . Diabetes mellitus without complication ([place])   . Elevated heart rate and blood pressure   . Esophageal reflux     Past Surgical History:  Procedure Laterality Date  . LAPAROSCOPIC VAGINAL HYSTERECTOMY N/A 05 10 2012    Review of Systems  Constitutional: Negative.   HENT: Negative.   Eyes: Negative.   Respiratory: Negative.   Gastrointestinal: Negative.   Genitourinary: Negative.   Skin: Positive for color change and rash.    Allergies as of 05/03/2016      Reactions   Penicillins Anaphylaxis   Sulfa Antibiotics Rash      Medication List       Accurate as of 05/03/16 11:59 PM. Always use your most recent med list.          ALPRAZolam 0.5 MG tablet Commonly known as:  XANAX Take 1 tablet (0.5 mg total) by mouth 2 (two) times daily as needed for anxiety.   doxycycline 100 MG tablet Commonly known as:  VIBRA-TABS Take 1 tablet (100 mg total) by mouth 2 (two) times daily.   estradiol 0.1  MG/24HR patch Commonly known as:  VIVELLE-DOT Place 1 patch (0.1 mg total) onto the skin 2 (two) times a week.   HYDROcodone-homatropine 5-1.5 MG/5ML syrup Commonly known as:  HYCODAN Take 5 mLs by mouth every 6 (six) hours as needed for cough.   lisinopril 20 MG tablet Commonly known as:  PRINIVIL,ZESTRIL Take 1 tablet (20 mg total) by mouth daily.   metFORMIN 500 MG 24 hr tablet Commonly known as:  GLUCOPHAGE XR Take 2 tablets (1,000 mg total) by mouth daily. With a meal.   pantoprazole 40 MG tablet Commonly known as:  PROTONIX Take 1 tablet (40 mg total) by mouth daily.   sertraline 100 MG tablet Commonly known as:  ZOLOFT Take 1 and 1/2 tabs daily   valACYclovir 1000 MG tablet Commonly known as:  VALTREX Take 1 tablet (1,000 mg total) by mouth 2 (two) times daily.          Objective:    BP 128/74   Pulse 80   Temp 97.7 F (36.5 C) (Oral)   Ht 5\' 1"  (1.549 m)   Wt 185 lb (83.9 kg)   BMI 34.96 kg/m   Allergies  Allergen Reactions  . Penicillins Anaphylaxis  . Sulfa Antibiotics Rash    Physical Exam  Constitutional: She is oriented to person, place, and time. She appears well-developed and well-nourished.  HENT:  Head: Normocephalic and atraumatic.  Eyes: Conjunctivae and EOM are normal. Pupils are equal, round, and reactive to light.  Cardiovascular: Normal rate, regular rhythm, normal heart sounds and intact distal pulses.   Pulmonary/Chest: Effort normal and breath sounds normal. She exhibits tenderness and edema. She exhibits no mass, no swelling and no retraction. Right breast exhibits tenderness. Right breast exhibits no inverted nipple and no mass. Breasts are symmetrical.    Abdominal: Soft. Bowel sounds are normal.  Neurological: She is alert and oriented to person, place, and time. She has normal reflexes.  Skin: Skin is warm and dry. No rash noted.  Psychiatric: She has a normal mood and affect. Her behavior is normal. Judgment and thought  content normal.  Vitals reviewed.   Results for orders placed or performed in visit on 05/02/16  Veritor Flu A/B Waived  Result Value Ref Range   Influenza A Negative Negative   Influenza B Negative Negative      Assessment & Plan:   1. Breast pain Keep mammogram appointment for later in the month. - doxycycline (VIBRA-TABS) 100 MG tablet; Take 1 tablet (100 mg total) by mouth 2 (two) times daily.  Dispense: 20 tablet; Refill: 0  2. Abscess of breast - doxycycline (VIBRA-TABS) 100 MG tablet; Take 1 tablet (100 mg total) by mouth 2 (two) times daily.  Dispense: 20 tablet; Refill: 0   Continue all other maintenance medications as listed above.  Follow up plan: Return if symptoms worsen or fail to improve.  No orders of the defined types were placed in this encounter.   Educational handout given for hidradenitis suppurativa  Terald Sleeper PA-C [place] [address]  [place], [place] [address] [phone]   05/05/2016, 8:10 PM

## 2016-05-16 ENCOUNTER — Ambulatory Visit: Payer: 59

## 2016-05-17 ENCOUNTER — Encounter: Payer: Self-pay | Admitting: Family

## 2016-05-17 ENCOUNTER — Ambulatory Visit (INDEPENDENT_AMBULATORY_CARE_PROVIDER_SITE_OTHER): Payer: 59 | Admitting: Family

## 2016-05-17 VITALS — BP 133/87 | HR 85 | Temp 96.8°F | Ht 62.0 in | Wt 185.0 lb

## 2016-05-17 DIAGNOSIS — G44209 Tension-type headache, unspecified, not intractable: Secondary | ICD-10-CM

## 2016-05-17 DIAGNOSIS — F411 Generalized anxiety disorder: Secondary | ICD-10-CM

## 2016-05-17 MED ORDER — ALPRAZOLAM 0.5 MG PO TABS
0.5000 mg | ORAL_TABLET | Freq: Two times a day (BID) | ORAL | 0 refills | Status: DC | PRN
Start: 2016-05-17 — End: 2020-09-25

## 2016-05-17 MED ORDER — KETOROLAC TROMETHAMINE 60 MG/2ML IM SOLN
60.0000 mg | Freq: Once | INTRAMUSCULAR | Status: AC
  Administered 2016-05-17: 60 mg via INTRAMUSCULAR

## 2016-05-17 NOTE — Progress Notes (Signed)
   Subjective:    Patient ID: [name], female    DOB: May 15, 1965, 51 y.o.   MRN: [id number]  PT presents to the office today for headache. Pt states she feels overwhelmed and stressed. Pt states since her husband died and her daughter started college this Fall.  Headache   This is a new problem. Episode onset: over last 3 days. The problem has been gradually worsening. The pain is located in the frontal region. The pain does not radiate. The pain quality is similar to prior headaches. The quality of the pain is described as aching. The pain is at a severity of 8/10. The pain is moderate. Associated symptoms include nausea. Pertinent negatives include no back pain, phonophobia, photophobia, sinus pressure or vomiting. The symptoms are aggravated by emotional stress. She has tried NSAIDs and Excedrin for the symptoms. The treatment provided mild relief. Her past medical history is significant for migraines in the family and obesity.      Review of Systems  HENT: Negative for sinus pressure.   Eyes: Negative for photophobia.  Gastrointestinal: Positive for nausea. Negative for vomiting.  Musculoskeletal: Negative for back pain.  Neurological: Positive for headaches.  All other systems reviewed and are negative.      Objective:   Physical Exam  Constitutional: She is oriented to person, place, and time. She appears well-developed and well-nourished. No distress.  HENT:  Head: Normocephalic.  Eyes: Pupils are equal, round, and reactive to light.  Neck: Normal range of motion. Neck supple. No thyromegaly present.  Cardiovascular: Normal rate, regular rhythm, normal heart sounds and intact distal pulses.   No murmur heard. Pulmonary/Chest: Effort normal and breath sounds normal. No respiratory distress. She has no wheezes.  Abdominal: Soft. Bowel sounds are normal. She exhibits no distension. There is no tenderness.  Musculoskeletal: Normal range of motion. She exhibits no edema or  tenderness.  Neurological: She is alert and oriented to person, place, and time.  Skin: Skin is warm and dry.  Psychiatric: She has a normal mood and affect. Her behavior is normal. Judgment and thought content normal.  Vitals reviewed.     BP 133/87   Pulse 85   Temp (!) 96.8 F (36 C) (Oral)   Ht 5\' 2"  (1.575 m)   Wt 185 lb (83.9 kg)   BMI 33.84 kg/m      Assessment & Plan:  1. Tension headache -Rest -Stress management discussed Avoid alcohol Stop Goodie powder - ketorolac (TORADOL) injection 60 mg; Inject 2 mLs (60 mg total) into the muscle once.  2. GAD (generalized anxiety disorder) Stress management discussed Xanax reordered Discussed only taking as needed - ALPRAZolam (XANAX) 0.5 MG tablet; Take 1 tablet (0.5 mg total) by mouth 2 (two) times daily as needed for anxiety.  Dispense: 60 tablet; Refill: 0  Evelina Dun, FNP

## 2016-05-17 NOTE — Patient Instructions (Signed)

## 2016-05-18 ENCOUNTER — Ambulatory Visit (INDEPENDENT_AMBULATORY_CARE_PROVIDER_SITE_OTHER): Payer: 59 | Admitting: Family Medicine

## 2016-05-18 ENCOUNTER — Encounter: Payer: Self-pay | Admitting: Family Medicine

## 2016-05-18 VITALS — BP 130/84 | HR 73 | Temp 96.8°F | Ht 62.0 in | Wt 185.0 lb

## 2016-05-18 DIAGNOSIS — R51 Headache: Secondary | ICD-10-CM | POA: Diagnosis not present

## 2016-05-18 DIAGNOSIS — E119 Type 2 diabetes mellitus without complications: Secondary | ICD-10-CM

## 2016-05-18 DIAGNOSIS — R519 Headache, unspecified: Secondary | ICD-10-CM

## 2016-05-18 DIAGNOSIS — Z79899 Other long term (current) drug therapy: Secondary | ICD-10-CM

## 2016-05-18 MED ORDER — KETOROLAC TROMETHAMINE 60 MG/2ML IM SOLN
60.0000 mg | Freq: Once | INTRAMUSCULAR | Status: AC
  Administered 2016-05-18: 60 mg via INTRAMUSCULAR

## 2016-05-18 MED ORDER — METHYLPREDNISOLONE ACETATE 80 MG/ML IJ SUSP
80.0000 mg | Freq: Once | INTRAMUSCULAR | Status: AC
  Administered 2016-05-18: 80 mg via INTRAMUSCULAR

## 2016-05-18 MED ORDER — PROMETHAZINE HCL 25 MG/ML IJ SOLN
25.0000 mg | Freq: Once | INTRAMUSCULAR | Status: AC
  Administered 2016-05-18: 25 mg via INTRAMUSCULAR

## 2016-05-18 MED ORDER — SUMATRIPTAN SUCCINATE 100 MG PO TABS: 100.0000 mg | ORAL_TABLET | ORAL | 0 refills | Status: DC | PRN

## 2016-05-18 NOTE — Addendum Note (Signed)
Addended by: Karle Plumber on: 05/18/2016 03:48 PM   Modules accepted: Orders

## 2016-05-18 NOTE — Progress Notes (Signed)
   HPI  Patient presents today here with headache.  Pt is an Therapist, sports in our clinic  Patient explains she's had persistent headache for the last 3-4 days. It was gradual in onset and started at church on Sunday. She states that it started out as just a little discomfort and then slowly worsened. Is described as bilateral frontal headache with no radiation. This morning it changed into a cap-Like pain. She has a history of tension headaches and this is similar.  The differences between this and her usual headache are nonresponse to Goody's powder or ibuprofen. She also has nausea and photophobia with this episode.  She denies any focal weakness or numbness. He denies any change in vision.  She is tolerating food and fluids normally.  She woke up with this headache this morning around 5:00  PMH: Smoking status noted ROS: Per HPI  Objective: BP 130/84   Pulse 73   Temp (!) 96.8 F (36 C) (Oral)   Ht 5\' 2"  (1.575 m)   Wt 185 lb (83.9 kg)   BMI 33.84 kg/m  Gen: NAD, alert, cooperative with exam HEENT: NCAT, PERRLA, Mild TTP of BL frontal sinuses CV: RRR, good S1/S2, no murmur Resp: CTABL, no wheezes, non-labored Ext: No edema, warm Neuro: Alert and oriented, strength 5/5 and sensation intact in all 4 extremities, cranial nerves II through XII intact, normal gait  Assessment and plan:  # Headache Most likely migraine headache with nausea, photophobia, and 4 day length. Patient has taken Goody's powder, ibuprofen, and treated with 1 injection IM Toradol with no improvement. Sumatriptan today, if no improvement by then of the workday she may get a headache cocktail including Toradol, Depo-Medrol, and Phenergan. Neuro exam is reassuring, no concern for bleed or other acute serious headache at this time.   Meds ordered this encounter  Medications  . SUMAtriptan (IMITREX) 100 MG tablet    Sig: Take 1 tablet (100 mg total) by mouth every 2 (two) hours as needed for migraine. May repeat  in 2 hours if headache persists or recurs.    Dispense:  10 tablet    Refill:  0    Laroy Apple, MD [facility] 05/18/2016, 11:41 AM

## 2016-05-20 ENCOUNTER — Telehealth: Payer: Self-pay | Admitting: *Deleted

## 2016-05-20 MED ORDER — GABAPENTIN 100 MG PO CAPS: 100.0000 mg | ORAL_CAPSULE | Freq: Three times a day (TID) | ORAL | 3 refills | Status: DC

## 2016-05-20 NOTE — Telephone Encounter (Signed)
Prescription sent to pharmacy.

## 2016-05-20 NOTE — Telephone Encounter (Signed)
Breast pain no better please advise

## 2016-06-14 ENCOUNTER — Ambulatory Visit: Payer: 59

## 2016-06-16 ENCOUNTER — Other Ambulatory Visit: Payer: Self-pay | Admitting: Pediatrics

## 2016-06-16 ENCOUNTER — Other Ambulatory Visit (INDEPENDENT_AMBULATORY_CARE_PROVIDER_SITE_OTHER): Payer: 59

## 2016-06-16 DIAGNOSIS — R399 Unspecified symptoms and signs involving the genitourinary system: Secondary | ICD-10-CM

## 2016-06-16 LAB — URINALYSIS, COMPLETE
BILIRUBIN UA: NEGATIVE
Glucose, UA: NEGATIVE
LEUKOCYTES UA: NEGATIVE
Nitrite, UA: NEGATIVE
PH UA: 5.5 (ref 5.0–7.5)
PROTEIN UA: NEGATIVE
Specific Gravity, UA: 1.025 (ref 1.005–1.030)
UUROB: 0.2 mg/dL (ref 0.2–1.0)

## 2016-06-16 LAB — MICROSCOPIC EXAMINATION: RENAL EPITHEL UA: NONE SEEN /HPF

## 2016-06-18 LAB — URINE CULTURE

## 2016-07-01 ENCOUNTER — Other Ambulatory Visit: Payer: Self-pay | Admitting: *Deleted

## 2016-07-01 MED ORDER — DOXYCYCLINE HYCLATE 100 MG PO TABS: 100.0000 mg | ORAL_TABLET | Freq: Two times a day (BID) | ORAL | 0 refills | Status: DC

## 2016-07-01 NOTE — Addendum Note (Signed)
Addended by: Cherre Robins on: 07/01/2016 02:48 PM   Modules accepted: Orders

## 2016-07-04 ENCOUNTER — Ambulatory Visit: Payer: 59

## 2016-07-05 ENCOUNTER — Other Ambulatory Visit: Payer: Self-pay

## 2016-07-05 DIAGNOSIS — F3342 Major depressive disorder, recurrent, in full remission: Secondary | ICD-10-CM

## 2016-07-05 MED ORDER — METFORMIN HCL ER 500 MG PO TB24
1000.0000 mg | ORAL_TABLET | Freq: Every day | ORAL | 0 refills | Status: DC
Start: 2016-07-05 — End: 2016-12-15

## 2016-07-05 MED ORDER — PANTOPRAZOLE SODIUM 40 MG PO TBEC: 40.0000 mg | DELAYED_RELEASE_TABLET | Freq: Every day | ORAL | 0 refills | Status: DC

## 2016-07-05 MED ORDER — SERTRALINE HCL 100 MG PO TABS: ORAL_TABLET | ORAL | 1 refills | Status: DC

## 2016-07-05 MED ORDER — METFORMIN HCL ER 500 MG PO TB24: 1000.0000 mg | ORAL_TABLET | Freq: Every day | ORAL | 0 refills | Status: DC

## 2016-07-05 MED ORDER — PANTOPRAZOLE SODIUM 40 MG PO TBEC: 40.0000 mg | DELAYED_RELEASE_TABLET | Freq: Every day | ORAL | 1 refills | Status: DC

## 2016-07-05 MED FILL — PANTOPRAZOLE SOD DR 40 MG T: 40 | 90 days supply | Qty: 90 | Fill #0

## 2016-07-05 MED FILL — METFORMIN HCL ER 500 MG TAB: 500 | 90 days supply | Qty: 180 | Fill #0

## 2016-07-05 MED FILL — SERTRALINE HCL 100 MG TAB: 100 | 90 days supply | Qty: 135 | Fill #0 | Status: TO

## 2016-07-11 ENCOUNTER — Other Ambulatory Visit (INDEPENDENT_AMBULATORY_CARE_PROVIDER_SITE_OTHER): Payer: 59

## 2016-07-11 ENCOUNTER — Other Ambulatory Visit: Payer: Self-pay | Admitting: Physician Assistant

## 2016-07-11 DIAGNOSIS — E119 Type 2 diabetes mellitus without complications: Secondary | ICD-10-CM

## 2016-07-11 DIAGNOSIS — R5383 Other fatigue: Secondary | ICD-10-CM

## 2016-07-11 DIAGNOSIS — I10 Essential (primary) hypertension: Secondary | ICD-10-CM

## 2016-07-14 DIAGNOSIS — E119 Type 2 diabetes mellitus without complications: Secondary | ICD-10-CM | POA: Diagnosis not present

## 2016-07-14 DIAGNOSIS — I10 Essential (primary) hypertension: Secondary | ICD-10-CM | POA: Diagnosis not present

## 2016-07-14 LAB — BAYER DCA HB A1C WAIVED: HB A1C (BAYER DCA - WAIVED): 5.8 % (ref <7.0)

## 2016-07-15 LAB — CMP14+EGFR
A/G RATIO: 1.6 (ref 1.2–2.2)
ALT: 26 IU/L (ref 0–32)
AST: 21 IU/L (ref 0–40)
Albumin: 4.4 g/dL (ref 3.5–5.5)
Alkaline Phosphatase: 67 IU/L (ref 39–117)
BILIRUBIN TOTAL: 0.4 mg/dL (ref 0.0–1.2)
BUN / CREAT RATIO: 28 — AB (ref 9–23)
BUN: 18 mg/dL (ref 6–24)
CHLORIDE: 99 mmol/L (ref 96–106)
CO2: 21 mmol/L (ref 18–29)
Calcium: 10 mg/dL (ref 8.7–10.2)
Creatinine, Ser: 0.65 mg/dL (ref 0.57–1.00)
GFR calc non Af Amer: 105 mL/min/{1.73_m2} (ref >59)
GFR, EST AFRICAN AMERICAN: 121 mL/min/{1.73_m2} (ref >59)
Globulin, Total: 2.8 g/dL (ref 1.5–4.5)
Glucose: 124 mg/dL — ABNORMAL HIGH (ref 65–99)
POTASSIUM: 4.5 mmol/L (ref 3.5–5.2)
Sodium: 141 mmol/L (ref 134–144)
TOTAL PROTEIN: 7.2 g/dL (ref 6.0–8.5)

## 2016-07-15 LAB — ANEMIA PROFILE B
Basophils Absolute: 0 10*3/uL (ref 0.0–0.2)
Basos: 0 %
EOS (ABSOLUTE): 0.2 10*3/uL (ref 0.0–0.4)
EOS: 2 %
Ferritin: 85 ng/mL (ref 15–150)
Folate: 3.6 ng/mL (ref >3.0)
HEMATOCRIT: 45 % (ref 34.0–46.6)
Hemoglobin: 15.4 g/dL (ref 11.1–15.9)
IMMATURE GRANS (ABS): 0.1 10*3/uL (ref 0.0–0.1)
IMMATURE GRANULOCYTES: 1 %
IRON SATURATION: 20 % (ref 15–55)
IRON: 62 ug/dL (ref 27–159)
LYMPHS: 15 %
Lymphocytes Absolute: 1.6 10*3/uL (ref 0.7–3.1)
MCH: 31.4 pg (ref 26.6–33.0)
MCHC: 34.2 g/dL (ref 31.5–35.7)
MCV: 92 fL (ref 79–97)
MONOCYTES: 6 %
MONOS ABS: 0.6 10*3/uL (ref 0.1–0.9)
NEUTROS PCT: 76 %
Neutrophils Absolute: 7.9 10*3/uL — ABNORMAL HIGH (ref 1.4–7.0)
Platelets: 330 10*3/uL (ref 150–379)
RBC: 4.9 x10E6/uL (ref 3.77–5.28)
RDW: 13.3 % (ref 12.3–15.4)
RETIC CT PCT: 1.3 % (ref 0.6–2.6)
TIBC: 308 ug/dL (ref 250–450)
UIBC: 246 ug/dL (ref 131–425)
Vitamin B-12: 263 pg/mL (ref 232–1245)
WBC: 10.4 10*3/uL (ref 3.4–10.8)

## 2016-07-15 LAB — LIPID PANEL
CHOLESTEROL TOTAL: 216 mg/dL — AB (ref 100–199)
Chol/HDL Ratio: 4.1 ratio units (ref 0.0–4.4)
HDL: 53 mg/dL (ref >39)
LDL CALC: 113 mg/dL — AB (ref 0–99)
Triglycerides: 249 mg/dL — ABNORMAL HIGH (ref 0–149)
VLDL CHOLESTEROL CAL: 50 mg/dL — AB (ref 5–40)

## 2016-07-15 LAB — MICROALBUMIN / CREATININE URINE RATIO
CREATININE, UR: 174.7 mg/dL
MICROALB/CREAT RATIO: 5.7 mg/g{creat} (ref 0.0–30.0)
MICROALBUM., U, RANDOM: 10 ug/mL

## 2016-07-28 ENCOUNTER — Ambulatory Visit (INDEPENDENT_AMBULATORY_CARE_PROVIDER_SITE_OTHER): Payer: 59 | Admitting: Pharmacist

## 2016-07-28 ENCOUNTER — Encounter: Payer: Self-pay | Admitting: Pharmacist

## 2016-07-28 DIAGNOSIS — E6609 Other obesity due to excess calories: Secondary | ICD-10-CM | POA: Diagnosis not present

## 2016-07-28 DIAGNOSIS — E119 Type 2 diabetes mellitus without complications: Secondary | ICD-10-CM | POA: Diagnosis not present

## 2016-07-28 MED ORDER — DULAGLUTIDE 0.75 MG/0.5ML ~~LOC~~ SOAJ: 0.7500 mg | SUBCUTANEOUS | 0 refills | Status: DC

## 2016-07-28 MED ORDER — SEMAGLUTIDE(0.25 OR 0.5MG/DOS) 2 MG/1.5ML ~~LOC~~ SOPN: 0.2500 mg | PEN_INJECTOR | SUBCUTANEOUS | 1 refills | Status: DC

## 2016-07-28 NOTE — Progress Notes (Signed)
Patient ID: [name], female   DOB: Apr 02, 1966, 51 y.o.   MRN: [id number]  Subjective:    [name] is a 51 y.o. female who presents to discuss pharmacotherapy options for weight loss and/or diabetes.  She is currently taking metfomrin XR 500mg  2 tablet qam.  BG is currently well controlled and a recent A1c was 5.8% (from 07/14/2016).    Current monitoring regimen: home blood tests - 1-2 times weekly Home blood sugar records: no records brought in today  Known diabetic complications: none Cardiovascular risk factors: diabetes mellitus, dyslipidemia, hypertension, obesity (BMI >= 30 kg/m2), sedentary lifestyle and smoking/ tobacco exposure Eye exam current (within one year): unknown Weight trend: stable Current diet: usually eats breakfast and lunch.  Usually not dinner/supper.   Current exercise: none Medication Compliance?  Yes   Is She on ACE inhibitor or angiotensin II receptor blocker?  Yes  lisinopril (Prinivil)    The following portions of the patient's history were reviewed and updated as appropriate: allergies, current medications, past family history and problem list.    Objective:    There were no vitals taken for this visit.  Lab Review Glucose (mg/dL)  Date Value  07/14/2016 124 (H)  12/07/2015 130 (H)  10/31/2014 120 (H)   Glucose, Bld (mg/dL)  Date Value  11/09/2013 133 (H)  09/18/2012 122 (H)  09/06/2010 163 (H)   CO2 (mmol/L)  Date Value  07/14/2016 21  12/07/2015 19  10/31/2014 23   BUN (mg/dL)  Date Value  07/14/2016 18  12/07/2015 13  10/31/2014 14   Creat (mg/dL)  Date Value  09/18/2012 0.62   Creatinine, Ser (mg/dL)  Date Value  07/14/2016 0.65  12/07/2015 0.81  10/31/2014 0.60      Assessment:     Obesity Diabetes Mellitus type II, under excellent control.    Plan:    1.  Rx changes: Checked into coverage of Ozempic however it was not covered by patient's insurance - preferred is Trulicity and Victoza    Changed to Trulicity 0.75mg  SQ weekly - gave #2 samples to try.  If effective and patient can tolerate then after 2 weeks will consider increase to 1.5mg  weekly.   Hold metformin  2.  Recommend check BG 1  times a day 3.  Recommended increase physical activity - goal is 150 minutes per week 4. Follow up: 2 weeks

## 2016-08-08 ENCOUNTER — Other Ambulatory Visit: Payer: Self-pay

## 2016-08-08 DIAGNOSIS — I10 Essential (primary) hypertension: Secondary | ICD-10-CM

## 2016-08-08 MED ORDER — LISINOPRIL 20 MG PO TABS: 20.0000 mg | ORAL_TABLET | Freq: Every day | ORAL | 1 refills | Status: DC

## 2016-08-08 MED FILL — LISINOPRIL 20 MG TABLET: 20 | 90 days supply | Qty: 90 | Fill #0

## 2016-08-11 ENCOUNTER — Ambulatory Visit (INDEPENDENT_AMBULATORY_CARE_PROVIDER_SITE_OTHER): Payer: 59

## 2016-08-11 DIAGNOSIS — Z23 Encounter for immunization: Secondary | ICD-10-CM

## 2016-09-05 ENCOUNTER — Encounter: Payer: Self-pay | Admitting: Pharmacist

## 2016-09-05 MED ORDER — DULAGLUTIDE 1.5 MG/0.5ML ~~LOC~~ SOAJ: 1.5000 mg | SUBCUTANEOUS | 0 refills | Status: DC

## 2016-09-05 NOTE — Progress Notes (Signed)
     [name] is a 51 y.o. female who was last seen by me about 6 weeks ago regarding type 2 DM and weight.  At that time metformin was stopped and patient was to try Ozempic however this was not covered by insurance.   We then switched to Trulicity and she was been injecting 0.75mg  SQ weekly.  Patient reports after the first dose she felt nauseate and that week she felt fuller sooner.  She states that she has not felt the same.  She feel that her weight has increased and she feels hungrier since starting Trulicity.   BG remains  well controlled  With HBG reported to be 90's to 120's.  Most recent A1c was 5.8% (from 07/14/2016).      Wt 189 lb 6.4 oz (85.9 kg)   BMI 34.64 kg/m   Weight has actually increase by 1#  Recommend increase Trulicity to 1.5mg  q week - if no decrease in weight noted then will try for PA for Ozempic or consider switch back to metformin

## 2016-09-21 DIAGNOSIS — G4730 Sleep apnea, unspecified: Secondary | ICD-10-CM | POA: Diagnosis not present

## 2016-09-29 ENCOUNTER — Encounter: Payer: Self-pay | Admitting: Pharmacist

## 2016-09-29 ENCOUNTER — Ambulatory Visit (INDEPENDENT_AMBULATORY_CARE_PROVIDER_SITE_OTHER): Payer: 59 | Admitting: Pharmacist

## 2016-09-29 ENCOUNTER — Other Ambulatory Visit: Payer: Self-pay | Admitting: Physician Assistant

## 2016-09-29 ENCOUNTER — Other Ambulatory Visit: Payer: Self-pay

## 2016-09-29 VITALS — BP 143/84 | HR 81 | Ht 62.0 in | Wt 193.5 lb

## 2016-09-29 DIAGNOSIS — E6609 Other obesity due to excess calories: Secondary | ICD-10-CM

## 2016-09-29 DIAGNOSIS — E119 Type 2 diabetes mellitus without complications: Secondary | ICD-10-CM

## 2016-09-29 DIAGNOSIS — G4733 Obstructive sleep apnea (adult) (pediatric): Secondary | ICD-10-CM | POA: Insufficient documentation

## 2016-09-29 MED ORDER — SEMAGLUTIDE(0.25 OR 0.5MG/DOS) 2 MG/1.5ML ~~LOC~~ SOPN: 0.5000 mg | PEN_INJECTOR | SUBCUTANEOUS | 1 refills | Status: DC

## 2016-09-29 MED FILL — SERTRALINE HCL 100 MG TAB: 100 | 90 days supply | Qty: 135 | Fill #0

## 2016-09-29 NOTE — Progress Notes (Signed)
Subjective:    [name] is a 51 y.o. female who presents for a follow up evaluation of Type 2 diabetes mellitus and weight management.  Patient was last seen 2 months ago.  At that time BG was controlled per A1c but FBG was in 130's to 150's.  She was taking metformin XR 500mg  2 tablet daily.   Initially we has wanted to start Ozempic because but insurance required her to try Trulicity first.  She has taken Trulicity 0.75mg  weekly for 4 weeks and then increased to 1.5mg  weekly for last 2 weeks.  She has tolerated well but reports increased appetite and weight has increased which is unexpected as usually we see decreased appetite and weight with GLP.   BG reading have improved with Trulicity - FBG ranges from 90's to 120's.   Known diabetic complications: none Cardiovascular risk factors: diabetes mellitus, dyslipidemia, hypertension, obesity (BMI >= 30 kg/m2), sedentary lifestyle and smoking/ tobacco exposure Eye exam current (within one year): unknown Weight trend: increasing steadily Prior visit with CDE: yes Current diet: eats only 2 meals per day - breakfast and lunch.  Mentions that she is very hungry in the morning.   Medication Compliance?  Yes   Is She on ACE inhibitor or angiotensin II receptor blocker?  Yes  lisinopril (Prinivil)    The following portions of the patient's history were reviewed and updated as appropriate: current medications and problem list.    Objective:    There were no vitals taken for this visit.  Lab Review Glucose (mg/dL)  Date Value  07/14/2016 124 (H)  12/07/2015 130 (H)  10/31/2014 120 (H)   Glucose, Bld (mg/dL)  Date Value  11/09/2013 133 (H)  09/18/2012 122 (H)  09/06/2010 163 (H)   CO2 (mmol/L)  Date Value  07/14/2016 21  12/07/2015 19  10/31/2014 23   BUN (mg/dL)  Date Value  07/14/2016 18  12/07/2015 13  10/31/2014 14   Creat (mg/dL)  Date Value  09/18/2012 0.62   Creatinine, Ser (mg/dL)  Date Value   07/14/2016 0.65  12/07/2015 0.81  10/31/2014 0.60      Assessment:    Diabetes Mellitus type II, under good control.   Obesity - weight has increased HTN - BG a little elevated today   Plan:    1.  Rx changes: D/C Trulicity. Start Ozempic 0.5mg  SQ weekly.  Will try to get PA since response to Trulicity was not as expected.  Sample for 4 weeks given. 2.  Dietary Counseling - recommended increase frequency of eating and smaller portions.  Made suggestion about small, quick snacks or meals she can have at nights.  Gave sample meal and snack idea handout.  Patient is also considering retrying Weight Watchers 3.  Continue to check BG 1  times a day 4.  Recommended increase physical activity 5. Follow up: 4 weeks

## 2016-09-30 MED FILL — PANTOPRAZOLE SOD DR 40 MG T: 40 | 90 days supply | Qty: 90 | Fill #0

## 2016-11-04 ENCOUNTER — Other Ambulatory Visit: Payer: Self-pay

## 2016-11-04 DIAGNOSIS — I10 Essential (primary) hypertension: Secondary | ICD-10-CM

## 2016-11-04 MED ORDER — SEMAGLUTIDE(0.25 OR 0.5MG/DOS) 2 MG/1.5ML ~~LOC~~ SOPN: 0.5000 mg | PEN_INJECTOR | SUBCUTANEOUS | 1 refills | Status: DC

## 2016-11-04 MED ORDER — LISINOPRIL 20 MG PO TABS: 20.0000 mg | ORAL_TABLET | Freq: Every day | ORAL | 1 refills | Status: DC

## 2016-11-04 MED FILL — LISINOPRIL 20 MG TAB: 20 | 90 days supply | Qty: 90 | Fill #0

## 2016-11-04 MED FILL — OZEMPIC 0.25 OR 0.5 MG/DOSE: 2 | 28 days supply | Qty: 2 | Fill #0

## 2016-11-30 DIAGNOSIS — D225 Melanocytic nevi of trunk: Secondary | ICD-10-CM | POA: Diagnosis not present

## 2016-11-30 DIAGNOSIS — L02229 Furuncle of trunk, unspecified: Secondary | ICD-10-CM | POA: Diagnosis not present

## 2016-11-30 DIAGNOSIS — B9689 Other specified bacterial agents as the cause of diseases classified elsewhere: Secondary | ICD-10-CM | POA: Diagnosis not present

## 2016-11-30 DIAGNOSIS — L821 Other seborrheic keratosis: Secondary | ICD-10-CM | POA: Diagnosis not present

## 2016-12-15 ENCOUNTER — Ambulatory Visit (INDEPENDENT_AMBULATORY_CARE_PROVIDER_SITE_OTHER): Payer: 59 | Admitting: Family Medicine

## 2016-12-15 ENCOUNTER — Encounter: Payer: Self-pay | Admitting: Family Medicine

## 2016-12-15 VITALS — BP 145/80 | HR 75 | Temp 96.9°F | Ht 62.0 in | Wt 183.0 lb

## 2016-12-15 DIAGNOSIS — M5442 Lumbago with sciatica, left side: Secondary | ICD-10-CM | POA: Diagnosis not present

## 2016-12-15 MED ORDER — PREDNISONE 20 MG PO TABS: 40.0000 mg | ORAL_TABLET | Freq: Every day | ORAL | 0 refills | Status: DC

## 2016-12-15 NOTE — Patient Instructions (Signed)
Great to see you!   

## 2016-12-15 NOTE — Progress Notes (Signed)
   HPI  Patient presents today with left-sided low back pain.  Patient explained she's had left-sided low back pain for about 2 weeks, is the most severe that it has been today. She denies any injury or recurrent pain in this area.  She has been doing very well with her diabetes, having recently started ozempic He is very proud about 20 pound weight loss recently.  She denies any leg weakness, numbness and tingling, or bowel or bladder dysfunction. Does not her current problem  Pain starts in the left lower back/buttock area and radiates around the left lateral hip to the left medial thigh, L2 or L3 distribution after reviewing with dermatome at  [place]: Smoking status noted ROS: Per HPI  Objective: BP (!) 145/80   Pulse 75   Temp (!) 96.9 F (36.1 C) (Oral)   Ht 5\' 2"  (1.575 m)   Wt 183 lb (83 kg)   BMI 33.47 kg/m  Gen: NAD, alert, cooperative with exam HEENT: NCAT CV: RRR, good S1/S2, no murmur Resp: CTABL, no wheezes, non-labored Ext: No edema, warm Neuro: Alert and oriented, strength 5/5 and sensation intact in bilateral lower extremities Tenderness to palpation over left-sided lower back over the paraspinal muscles, no midline tenderness Contralateral modified straight leg raise with reproduction of symptoms when she rotates her foot outward  Assessment and plan:  #Left low back pain, with sciatica Patient has used copious NSAIDs without improvement Short course of steroids, discussed detrimental effect on her blood sugar control and weight. Offered stronger medication including hydrocodone, she declines this for now. Discussed Toradol injection, however she just took 800 mg ibuprofen about a half an hour ago, and declines this as well. No plain film today, consider x-ray, physical therapy if persistent, also consider MRI if worsening     Meds ordered this encounter  Medications  . predniSONE (DELTASONE) 20 MG tablet    Sig: Take 2 tablets (40 mg total) by mouth  daily with breakfast.    Dispense:  14 tablet    Refill:  0    Laroy Apple, MD [facility] 12/15/2016, 10:13 AM

## 2016-12-20 ENCOUNTER — Ambulatory Visit (HOSPITAL_COMMUNITY)
Admission: RE | Admit: 2016-12-20 | Discharge: 2016-12-20 | Disposition: A | Discharge status: Home / Self Care | Payer: 59 | Source: Ambulatory Visit | Attending: Family Medicine | Admitting: Family Medicine

## 2016-12-20 ENCOUNTER — Encounter: Payer: Self-pay | Admitting: Family Medicine

## 2016-12-20 ENCOUNTER — Ambulatory Visit (INDEPENDENT_AMBULATORY_CARE_PROVIDER_SITE_OTHER): Payer: 59 | Admitting: Family Medicine

## 2016-12-20 VITALS — BP 143/86 | HR 84 | Temp 97.5°F | Ht 62.0 in | Wt 182.0 lb

## 2016-12-20 DIAGNOSIS — M48061 Spinal stenosis, lumbar region without neurogenic claudication: Secondary | ICD-10-CM | POA: Insufficient documentation

## 2016-12-20 DIAGNOSIS — M5416 Radiculopathy, lumbar region: Secondary | ICD-10-CM

## 2016-12-20 DIAGNOSIS — M5116 Intervertebral disc disorders with radiculopathy, lumbar region: Secondary | ICD-10-CM | POA: Insufficient documentation

## 2016-12-20 DIAGNOSIS — M545 Low back pain: Secondary | ICD-10-CM | POA: Diagnosis not present

## 2016-12-20 MED ORDER — KETOROLAC TROMETHAMINE 60 MG/2ML IM SOLN
60.0000 mg | Freq: Once | INTRAMUSCULAR | Status: AC
  Administered 2016-12-20: 60 mg via INTRAMUSCULAR

## 2016-12-20 MED ORDER — OXYCODONE-ACETAMINOPHEN 5-325 MG PO TABS: 1.0000 | ORAL_TABLET | ORAL | 0 refills | Status: DC | PRN

## 2016-12-20 NOTE — Progress Notes (Signed)
   HPI  Patient presents today for follow-up of back pain.  Patient is on day 6 of steroids, her pain improved slightly, however the last 2 days she's had 8-9 out of 10 pain.  Patient states that 2 days ago she developed left lower leg numbness. She describes it as altered sensation. She states that she does not feel using the reflux however.  She has a limp when walking, however no leg weakness. No fever, chills, sweats Pain not helped by over-the-counter pain patch for prednisone, or NSAIDs  PMH: Smoking status noted ROS: Per HPI  Objective: BP (!) 143/86   Pulse 84   Temp (!) 97.5 F (36.4 C) (Oral)   Ht 5\' 2"  (1.575 m)   Wt 182 lb (82.6 kg)   BMI 33.29 kg/m  Gen: NAD, alert, cooperative with exam HEENT: NCAT Ext: No edema, warm Neuro: Alert and oriented, altered sensation of the left lower extremity, decrease patellar tendon reflex on the left MSK:  No tenderness to palpation of paraspinal muscles or midline spine and lumbar area   Assessment and plan:  # Lumbar radiculopathy Worsening pain with progressive paresthesias of the left lower extremity MRI ordered Continue steroid burst Refer as appropriate  IM toradol given 5 days supply percocet given.  MRI scheduled for today     Orders Placed This Encounter  Procedures  . MR Lumbar Spine Wo Contrast    Standing Status:   Future    Standing Expiration Date:   02/19/2018    Order Specific Question:   What is the patient's sedation requirement?    Answer:   No Sedation    Order Specific Question:   Does the patient have a pacemaker or implanted devices?    Answer:   No    Order Specific Question:   Preferred imaging location?    Answer:   [facility] (table limit-350lbs)    Order Specific Question:   Radiology Contrast Protocol - do NOT remove file path    Answer:   [email or web address]     Laroy Apple, MD [facility] 12/20/2016, 11:57 AM

## 2016-12-21 DIAGNOSIS — M5416 Radiculopathy, lumbar region: Secondary | ICD-10-CM | POA: Diagnosis not present

## 2016-12-21 DIAGNOSIS — M545 Low back pain: Secondary | ICD-10-CM | POA: Diagnosis not present

## 2016-12-21 DIAGNOSIS — S330XXA Traumatic rupture of lumbar intervertebral disc, initial encounter: Secondary | ICD-10-CM | POA: Diagnosis not present

## 2017-01-03 DIAGNOSIS — M545 Low back pain: Secondary | ICD-10-CM | POA: Diagnosis not present

## 2017-01-03 DIAGNOSIS — M5416 Radiculopathy, lumbar region: Secondary | ICD-10-CM | POA: Diagnosis not present

## 2017-01-11 DIAGNOSIS — M5416 Radiculopathy, lumbar region: Secondary | ICD-10-CM | POA: Diagnosis not present

## 2017-01-19 ENCOUNTER — Ambulatory Visit: Payer: 59 | Admitting: Family Medicine

## 2017-01-24 ENCOUNTER — Ambulatory Visit (INDEPENDENT_AMBULATORY_CARE_PROVIDER_SITE_OTHER): Payer: 59 | Admitting: Family Medicine

## 2017-01-24 ENCOUNTER — Ambulatory Visit: Payer: Self-pay

## 2017-01-24 VITALS — BP 126/82 | HR 87 | Temp 96.9°F | Ht 62.0 in | Wt 180.0 lb

## 2017-01-24 DIAGNOSIS — G43109 Migraine with aura, not intractable, without status migrainosus: Secondary | ICD-10-CM

## 2017-01-24 MED ORDER — KETOROLAC TROMETHAMINE 60 MG/2ML IM SOLN
60.0000 mg | Freq: Once | INTRAMUSCULAR | Status: AC
  Administered 2017-01-24: 60 mg via INTRAMUSCULAR

## 2017-01-24 NOTE — Progress Notes (Signed)
   HPI  Patient presents today here for headache.  Patient states she's had a headache for 45 days, this is been dull and off-and-on. This morning it began to get worse with predominant right-sided throbbing symptoms, photophobia, upset stomach with nausea. She tried over-the-counter medication, Goody powder, with no improvement.  She denies any weakness, numbness, tingling. She also denies any visual disturbance.  She states that she has a long history of severe headaches, this is not a new problem. This is not the worse headache of her life  He previously had some difficulty with left lower extremity radiculopathy, she has been seen neurosurgery for this and receiving epidural/facet injections and is now having return of sensation of her left lower extremity. She is also happy to say that her reflexes are improved today  She was given an injection of IM Toradol earlier with now very good benefit. She states that her headache has almost completely resolved at this point, her injection was about one hour ago.   PMH: Smoking status noted ROS: Per HPI  Objective: BP 126/82   Pulse 87   Temp (!) 96.9 F (36.1 C) (Oral)   Ht 5\' 2"  (1.575 m)   Wt 180 lb (81.6 kg)   BMI 32.92 kg/m  Gen: NAD, alert, cooperative with exam HEENT: NCAT, EOMI, PERRL CV: RRR, good S1/S2, no murmur Resp: CTABL, no wheezes, non-labored Ext: No edema, warm Neuro: Alert and oriented, strength 5/5 and sensation intact in all 4 extremities, 2+ patellar tendon reflexes bilaterally, cranial nerves II through XII intact  Assessment and plan:  # Migraine headache Treated with IM Toradol today, improved Discussed trying tryptophans in the first 30 minutes of characteristic migraine headache. Offered neurology/headache clinic referral, she will consider She's having 2-3 total headaches a month, however they are quite severe, no need for prophylaxis yet    Meds ordered this encounter  Medications  . ketorolac  (TORADOL) injection 60 mg    Laroy Apple, MD [facility] 01/24/2017, 11:51 AM

## 2017-02-07 MED FILL — OZEMPIC 0.25 OR 0.5 MG/DOSE: 2 | 28 days supply | Qty: 2 | Fill #1

## 2017-02-16 ENCOUNTER — Other Ambulatory Visit: Payer: Self-pay

## 2017-02-16 DIAGNOSIS — F3342 Major depressive disorder, recurrent, in full remission: Secondary | ICD-10-CM

## 2017-02-16 MED ORDER — PANTOPRAZOLE SODIUM 40 MG PO TBEC: 40.0000 mg | DELAYED_RELEASE_TABLET | Freq: Every day | ORAL | 0 refills | Status: DC

## 2017-02-16 MED ORDER — SERTRALINE HCL 100 MG PO TABS: ORAL_TABLET | ORAL | 0 refills | Status: DC

## 2017-02-16 MED FILL — SERTRALINE HCL 100 MG TAB: 100 | 90 days supply | Qty: 135 | Fill #0

## 2017-02-16 MED FILL — PANTOPRAZOLE SOD DR 40 MG T: 40 | 90 days supply | Qty: 90 | Fill #0

## 2017-02-16 MED FILL — LISINOPRIL 20 MG TAB: 20 | 90 days supply | Qty: 90 | Fill #1

## 2017-03-15 ENCOUNTER — Other Ambulatory Visit: Payer: Self-pay | Admitting: Family Medicine

## 2017-03-15 DIAGNOSIS — Z1231 Encounter for screening mammogram for malignant neoplasm of breast: Secondary | ICD-10-CM

## 2017-04-17 ENCOUNTER — Ambulatory Visit: Payer: 59

## 2017-04-26 ENCOUNTER — Ambulatory Visit
Admission: RE | Admit: 2017-04-26 | Discharge: 2017-04-26 | Disposition: A | Discharge status: Home / Self Care | Payer: 59 | Source: Ambulatory Visit | Attending: Family Medicine | Admitting: Family Medicine

## 2017-04-26 ENCOUNTER — Ambulatory Visit: Payer: 59

## 2017-04-26 DIAGNOSIS — Z1231 Encounter for screening mammogram for malignant neoplasm of breast: Secondary | ICD-10-CM | POA: Diagnosis not present

## 2017-05-09 ENCOUNTER — Ambulatory Visit (INDEPENDENT_AMBULATORY_CARE_PROVIDER_SITE_OTHER): Payer: 59 | Admitting: Family Medicine

## 2017-05-09 ENCOUNTER — Encounter: Payer: Self-pay | Admitting: Family Medicine

## 2017-05-09 VITALS — BP 127/75 | HR 100 | Temp 97.3°F | Wt 179.0 lb

## 2017-05-09 DIAGNOSIS — R300 Dysuria: Secondary | ICD-10-CM

## 2017-05-09 DIAGNOSIS — N3001 Acute cystitis with hematuria: Secondary | ICD-10-CM | POA: Diagnosis not present

## 2017-05-09 LAB — URINALYSIS, COMPLETE
Bilirubin, UA: NEGATIVE
GLUCOSE, UA: NEGATIVE
KETONES UA: NEGATIVE
Leukocytes, UA: NEGATIVE
NITRITE UA: NEGATIVE
Protein, UA: NEGATIVE
Specific Gravity, UA: 1.02 (ref 1.005–1.030)
UUROB: 0.2 mg/dL (ref 0.2–1.0)
pH, UA: 5.5 (ref 5.0–7.5)

## 2017-05-09 LAB — MICROSCOPIC EXAMINATION: Renal Epithel, UA: NONE SEEN /hpf

## 2017-05-09 MED ORDER — NITROFURANTOIN MONOHYD MACRO 100 MG PO CAPS: 100.0000 mg | ORAL_CAPSULE | Freq: Two times a day (BID) | ORAL | 0 refills | Status: AC

## 2017-05-09 NOTE — Progress Notes (Signed)
Subjective: CC: dysuria PCP: Timmothy Euler, MD [name] is a 52 y.o. female presenting to clinic today for:  1. Urinary symptoms Patient reports a 1 day h/o dysuria and bladder pressure.  Denies urinary frequency, urgency, hematuria, fevers, chills, abdominal pain, nausea, vomiting, back pain, vaginal discharge.  Patient has used nothing for symptoms.  Patient denies a h/o frequent or recurrent UTIs.  Patient notes that she went to urology over 4 years ago and had several procedures done for hematuria.  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  She never returned because of the extreme discomfort associated with the procedures.  She notes reluctance to be evaluated again by urology.  ROS: Per HPI  Allergies  Allergen Reactions  . Penicillins Anaphylaxis  . Sulfa Antibiotics Rash   Past Medical History:  Diagnosis Date  . Anxiety   . Diabetes mellitus without complication ([place])   . Elevated heart rate and blood pressure   . Esophageal reflux   . Migraines     Current Outpatient Medications:  .  ALPRAZolam (XANAX) 0.5 MG tablet, Take 1 tablet (0.5 mg total) by mouth 2 (two) times daily as needed for anxiety., Disp: 60 tablet, Rfl: 0 .  lisinopril (PRINIVIL,ZESTRIL) 20 MG tablet, Take 1 tablet (20 mg total) by mouth daily., Disp: 90 tablet, Rfl: 1 .  pantoprazole (PROTONIX) 40 MG tablet, Take 1 tablet (40 mg total) by mouth daily., Disp: 90 tablet, Rfl: 0 .  Semaglutide (OZEMPIC) 0.25 or 0.5 MG/DOSE SOPN, Inject 0.5 mg into the skin once a week., Disp: 3 pen, Rfl: 1 .  sertraline (ZOLOFT) 100 MG tablet, Take 1 and 1/2 tabs daily, Disp: 135 tablet, Rfl: 0 .  valACYclovir (VALTREX) 1000 MG tablet, Take 1 tablet (1,000 mg total) by mouth 2 (two) times daily., Disp: 20 tablet, Rfl: 0 Social History   Socioeconomic History  . Marital status: Widowed    Spouse name: Not on file  . Number of children: Not on  file  . Years of education: Not on file  . Highest education level: Not on file  Social Needs  . Financial resource strain: Not on file  . Food insecurity - worry: Not on file  . Food insecurity - inability: Not on file  . Transportation needs - medical: Not on file  . Transportation needs - non-medical: Not on file  Occupational History  . Not on file  Tobacco Use  . Smoking status: Current Every Day Smoker  . Smokeless tobacco: Never Used  Substance and Sexual Activity  . Alcohol use: Yes    Alcohol/week: 1.0 oz    Types: 2 Standard drinks or equivalent per week  . Drug use: No  . Sexual activity: Yes  Other Topics Concern  . Not on file  Social History Narrative  . Not on file   Family History  Problem Relation Age of Onset  . Breast cancer Mother   . Diabetes Father   . Diabetes Paternal Uncle     Objective: Office vital signs reviewed. BP 127/75   Pulse 100   Temp (!) 97.3 F (36.3 C)   Wt 179 lb (81.2 kg)   BMI 32.74 kg/m   Physical Examination:  General: Awake, alert, well nourished, well appearing female, No acute distress HEENT: sclera white, MMM GU: no suprapubic TTP, no CVA TTP  Assessment/ Plan: 52 y.o. female   1. Acute cystitis with hematuria Urinalysis with  1+ blood, urine microscopy with 3-10 red blood cells and few bacteria.  Urine sent for culture.  Patient is afebrile with normal vital signs.  She is well-appearing.  Doubt pyelonephritis at this point.  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  She is unable to take sulfa antibiotics.  We will treat with nitrofurantoin given likelihood of uncomplicated cystitis.  Push oral fluids.  Follow-up as needed. - Urinalysis, Complete - nitrofurantoin, macrocrystal-monohydrate, (MACROBID) 100 MG capsule; Take 1 capsule (100 mg total) by mouth 2 (two) times daily for 5 days. 1 po BId  Dispense: 10 capsule; Refill: 0 - Urine Culture  2. Dysuria - Urinalysis, Complete - nitrofurantoin,  macrocrystal-monohydrate, (MACROBID) 100 MG capsule; Take 1 capsule (100 mg total) by mouth 2 (two) times daily for 5 days. 1 po BId  Dispense: 10 capsule; Refill: 0 - Urine Culture   Orders Placed This Encounter  Procedures  . Urine Culture  . Urinalysis, Complete   Meds ordered this encounter  Medications  . nitrofurantoin, macrocrystal-monohydrate, (MACROBID) 100 MG capsule    Sig: Take 1 capsule (100 mg total) by mouth 2 (two) times daily for 5 days. 1 po BId    Dispense:  10 capsule    Refill:  [place], DO [place] [phone]

## 2017-05-10 ENCOUNTER — Ambulatory Visit: Payer: 59 | Admitting: Family Medicine

## 2017-05-10 LAB — URINE CULTURE

## 2017-05-12 ENCOUNTER — Other Ambulatory Visit: Payer: Self-pay | Admitting: Family Medicine

## 2017-05-12 DIAGNOSIS — N3001 Acute cystitis with hematuria: Secondary | ICD-10-CM

## 2017-05-12 DIAGNOSIS — R300 Dysuria: Secondary | ICD-10-CM

## 2017-05-26 ENCOUNTER — Telehealth: Payer: Self-pay | Admitting: *Deleted

## 2017-05-26 NOTE — Telephone Encounter (Signed)
Please call in a Z-Pak and have her do Mucinex and nasal saline

## 2017-05-29 NOTE — Telephone Encounter (Signed)
This was phoned in on Friday

## 2017-06-07 ENCOUNTER — Other Ambulatory Visit: Payer: Self-pay | Admitting: *Deleted

## 2017-06-07 ENCOUNTER — Other Ambulatory Visit: Payer: Self-pay | Admitting: Physician Assistant

## 2017-06-07 DIAGNOSIS — I10 Essential (primary) hypertension: Secondary | ICD-10-CM

## 2017-06-07 MED ORDER — LISINOPRIL 20 MG PO TABS: 20.0000 mg | ORAL_TABLET | Freq: Every day | ORAL | 1 refills | Status: DC

## 2017-06-07 MED ORDER — PANTOPRAZOLE SODIUM 40 MG PO TBEC: 40.0000 mg | DELAYED_RELEASE_TABLET | Freq: Every day | ORAL | 0 refills | Status: DC

## 2017-06-07 MED ORDER — HYDROCODONE-ACETAMINOPHEN 5-325 MG PO TABS: 1.0000 | ORAL_TABLET | Freq: Four times a day (QID) | ORAL | 0 refills | Status: DC | PRN

## 2017-06-07 MED FILL — SHIPPING COST: 1 days supply | Qty: 1 | Fill #0

## 2017-06-07 MED FILL — LISINOPRIL 20 MG TABLET: 20 | 90 days supply | Qty: 90 | Fill #0

## 2017-06-07 MED FILL — PANTOPRAZOLE SOD DR 40 MG T: 40 | 90 days supply | Qty: 90 | Fill #0

## 2017-06-07 NOTE — Progress Notes (Signed)
She has had a flareup of her low back and bulging disc again.  She left over hydrocodone from her previous exacerbation.  She has been taking these on a prn basis.  She is going on March 13 for a new with the neurosurgeon.

## 2017-07-12 ENCOUNTER — Other Ambulatory Visit: Payer: Self-pay | Admitting: Physician Assistant

## 2017-07-12 MED ORDER — DOXYCYCLINE HYCLATE 100 MG PO TABS: 100.0000 mg | ORAL_TABLET | Freq: Two times a day (BID) | ORAL | 1 refills | Status: DC

## 2017-07-17 ENCOUNTER — Other Ambulatory Visit: Payer: Self-pay | Admitting: *Deleted

## 2017-07-17 MED ORDER — SEMAGLUTIDE(0.25 OR 0.5MG/DOS) 2 MG/1.5ML ~~LOC~~ SOPN: 0.5000 mg | PEN_INJECTOR | SUBCUTANEOUS | 11 refills | Status: DC

## 2017-07-17 MED FILL — OZEMPIC 0.25 OR 0.5 MG/DOSE: 2 | 28 days supply | Qty: 2 | Fill #0

## 2017-07-17 MED FILL — SHIPPING COST: 1 days supply | Qty: 1 | Fill #1

## 2017-07-24 DIAGNOSIS — N390 Urinary tract infection, site not specified: Secondary | ICD-10-CM | POA: Diagnosis not present

## 2017-07-24 DIAGNOSIS — R3982 Chronic bladder pain: Secondary | ICD-10-CM | POA: Diagnosis not present

## 2017-08-18 IMAGING — DX DG FOOT COMPLETE 3+V*L*
3 series · 3 of 3 positions shown · non-contrast
Comparison: None.

CLINICAL DATA: Pain and swelling of the left foot and ankle
secondary to a twisting injury and fall 3 days ago.

EXAM:
LEFT FOOT - COMPLETE 3+ VIEW

[foot ap]
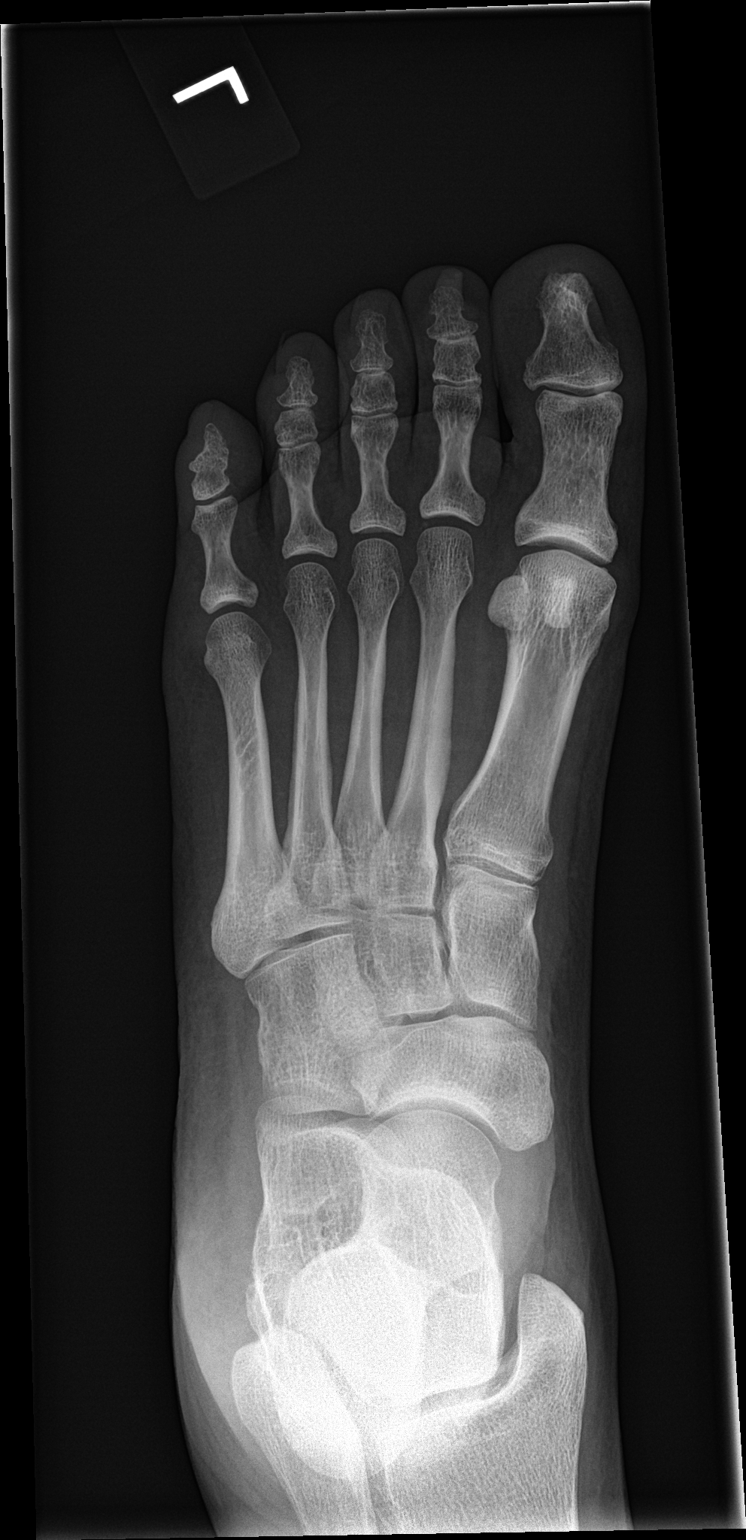

[foot obl]
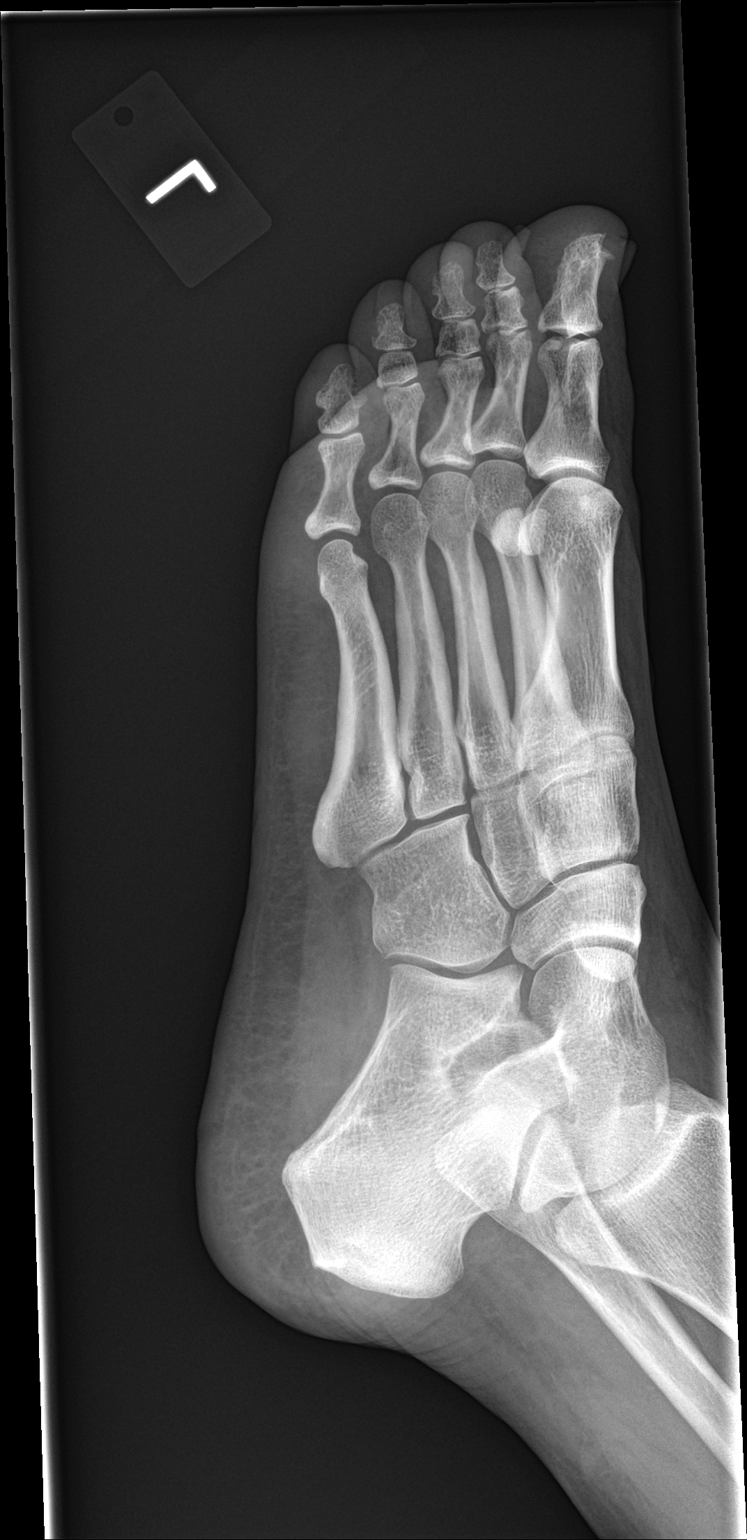

[foot lat]
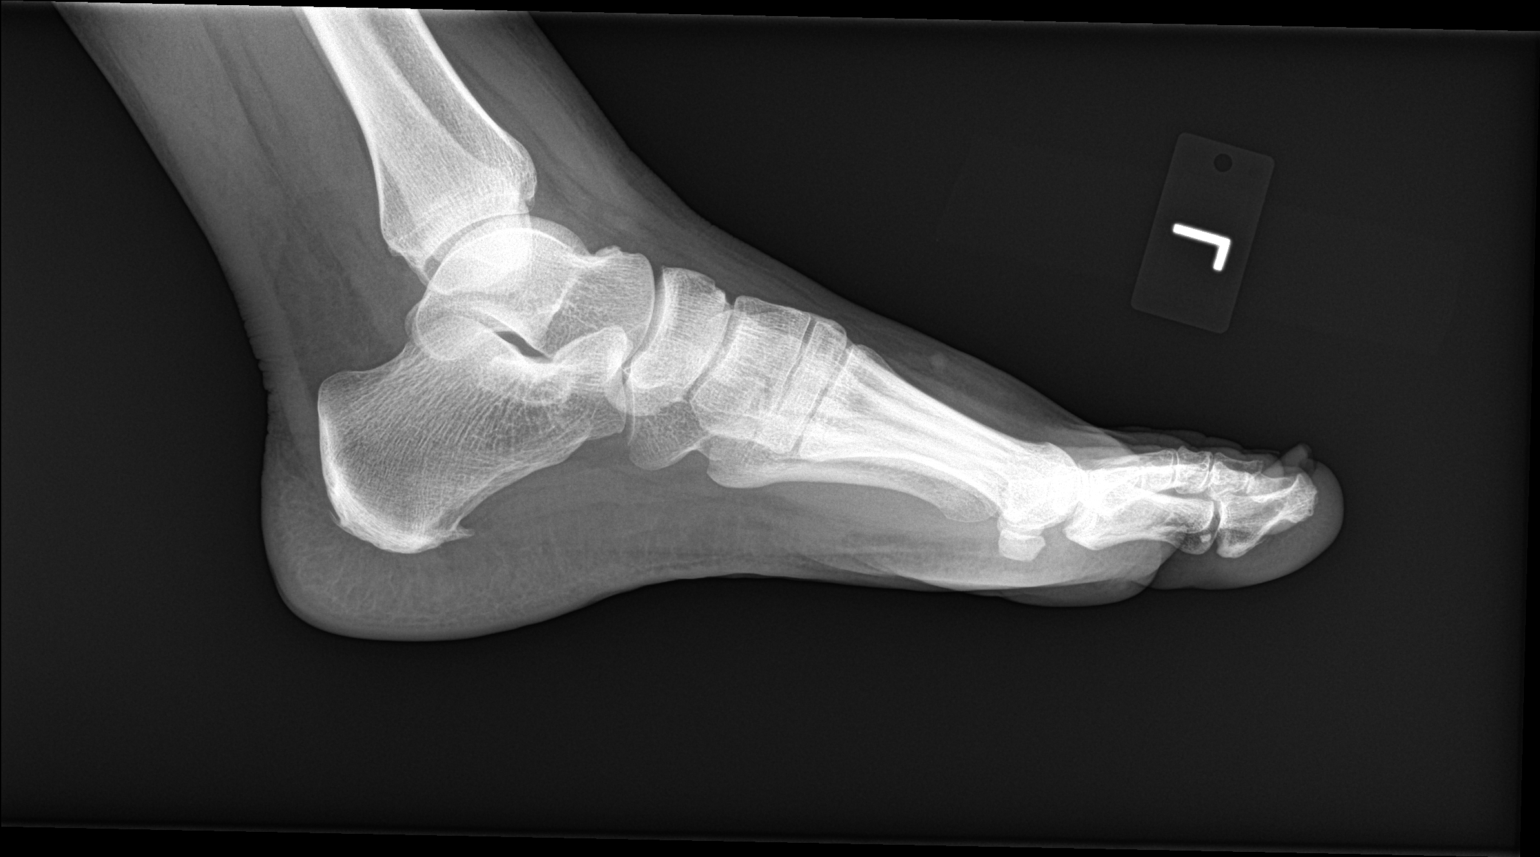

[3 of 3 positions shown; findings below may reference images not displayed]

FINDINGS: There is no fracture or dislocation or other significant
abnormality.
IMPRESSION: Negative.

## 2017-09-06 ENCOUNTER — Other Ambulatory Visit: Payer: Self-pay | Admitting: *Deleted

## 2017-09-06 ENCOUNTER — Other Ambulatory Visit: Payer: 59 | Admitting: Physician Assistant

## 2017-09-06 ENCOUNTER — Encounter: Payer: Self-pay | Admitting: Physician Assistant

## 2017-09-06 ENCOUNTER — Ambulatory Visit (INDEPENDENT_AMBULATORY_CARE_PROVIDER_SITE_OTHER): Payer: 59 | Admitting: Physician Assistant

## 2017-09-06 VITALS — BP 136/88 | HR 79 | Ht 62.0 in | Wt 178.2 lb

## 2017-09-06 DIAGNOSIS — E119 Type 2 diabetes mellitus without complications: Secondary | ICD-10-CM

## 2017-09-06 DIAGNOSIS — F3342 Major depressive disorder, recurrent, in full remission: Secondary | ICD-10-CM

## 2017-09-06 DIAGNOSIS — Z0000 Encounter for general adult medical examination without abnormal findings: Secondary | ICD-10-CM

## 2017-09-06 DIAGNOSIS — I10 Essential (primary) hypertension: Secondary | ICD-10-CM

## 2017-09-06 MED ORDER — OMEPRAZOLE 20 MG PO CPDR: 20.0000 mg | DELAYED_RELEASE_CAPSULE | Freq: Two times a day (BID) | ORAL | 3 refills | Status: DC

## 2017-09-06 MED ORDER — SERTRALINE HCL 100 MG PO TABS: ORAL_TABLET | ORAL | 0 refills | Status: DC

## 2017-09-06 MED ORDER — LISINOPRIL 20 MG PO TABS: 30.0000 mg | ORAL_TABLET | Freq: Every day | ORAL | 1 refills | Status: DC

## 2017-09-06 MED ORDER — BUPROPION HCL ER (XL) 150 MG PO TB24: 150.0000 mg | ORAL_TABLET | Freq: Every day | ORAL | 5 refills | Status: DC

## 2017-09-06 MED FILL — SHIPPING COST: 1 days supply | Qty: 1 | Fill #2

## 2017-09-06 MED FILL — OMEPRAZOLE 20 MG CAP: 20 | 90 days supply | Qty: 180 | Fill #0

## 2017-09-06 MED FILL — SERTRALINE HCL 100 MG TAB: 100 | 90 days supply | Qty: 135 | Fill #0

## 2017-09-06 MED FILL — LISINOPRIL 20 MG TABLET: 20 | 90 days supply | Qty: 135 | Fill #0

## 2017-09-06 MED FILL — buPROPion HCL ER (XL) 150 M: 150 | 30 days supply | Qty: 30 | Fill #0

## 2017-09-06 NOTE — Progress Notes (Signed)
BP 136/88   Pulse 79   Ht 5' 2"  (1.575 m)   Wt 178 lb 3.2 oz (80.8 kg)   BMI 32.59 kg/m    Subjective:    Patient ID: [name], female    DOB: [birth year], 52 y.o.   MRN: [id number]  HPI: [name] is a 52 y.o. female presenting on 09/06/2017 for Annual Exam This patient comes in for annual well physical examination. All medications are reviewed today. There are no reports of any problems with the medications. All of the medical conditions are reviewed and updated.  Lab work is reviewed and will be ordered as medically necessary. There are no new problems reported with today's visit.  Patient reports doing well overall.  Some increased worry and stressors.  Has been on zoloft a long time.  Has poor concentration, mild memory loss at times. Discussed addition of wellbutrin in order to cover dopamine receptors more.   Past Medical History:  Diagnosis Date  . Anxiety   . Diabetes mellitus without complication ([facility])   . Elevated heart rate and blood pressure   . Esophageal reflux   . Migraines    Relevant past medical, surgical, family and social history reviewed and updated as indicated. Interim medical history since our last visit reviewed. Allergies and medications reviewed and updated. DATA REVIEWED: CHART IN EPIC  Family History reviewed for pertinent findings.  Review of Systems  Constitutional: Positive for fatigue. Negative for activity change and fever.  HENT: Negative.   Eyes: Negative.   Respiratory: Negative.  Negative for cough.   Cardiovascular: Negative.  Negative for chest pain.  Gastrointestinal: Negative.  Negative for abdominal pain.  Endocrine: Negative.   Genitourinary: Negative.  Negative for dysuria.  Musculoskeletal: Negative.   Skin: Negative.   Neurological: Negative.   Psychiatric/Behavioral: Positive for decreased concentration and sleep disturbance. Negative for agitation, behavioral problems and suicidal ideas.    Allergies as  of 09/06/2017      Reactions   Penicillins Anaphylaxis   Sulfa Antibiotics Rash      Medication List        Accurate as of 09/06/17 11:59 AM. Always use your most recent med list.          ALPRAZolam 0.5 MG tablet Commonly known as:  XANAX Take 1 tablet (0.5 mg total) by mouth 2 (two) times daily as needed for anxiety.   buPROPion 150 MG 24 hr tablet Commonly known as:  WELLBUTRIN XL Take 1 tablet (150 mg total) by mouth daily.   HYDROcodone-acetaminophen 5-325 MG tablet Commonly known as:  NORCO/VICODIN Take 1 tablet by mouth every 6 (six) hours as needed for moderate pain.   lisinopril 20 MG tablet Commonly known as:  PRINIVIL,ZESTRIL Take 1.5 tablets (30 mg total) by mouth daily.   omeprazole 20 MG capsule Commonly known as:  PRILOSEC Take 1 capsule (20 mg total) by mouth 2 (two) times daily before a meal.   Semaglutide 0.25 or 0.5 MG/DOSE Sopn Commonly known as:  OZEMPIC Inject 0.5 mg into the skin once a week.   sertraline 100 MG tablet Commonly known as:  ZOLOFT Take 1 and 1/2 tabs daily   valACYclovir 1000 MG tablet Commonly known as:  VALTREX Take 1 tablet (1,000 mg total) by mouth 2 (two) times daily.          Objective:    BP 136/88   Pulse 79   Ht 5' 2"  (1.575 m)   Wt 178 lb 3.2  oz (80.8 kg)   BMI 32.59 kg/m   Allergies  Allergen Reactions  . Penicillins Anaphylaxis  . Sulfa Antibiotics Rash    Wt Readings from Last 3 Encounters:  09/06/17 178 lb 3.2 oz (80.8 kg)  05/09/17 179 lb (81.2 kg)  01/24/17 180 lb (81.6 kg)    Physical Exam  Constitutional: She is oriented to person, place, and time. She appears well-developed and well-nourished.  HENT:  Head: Normocephalic and atraumatic.  Right Ear: Tympanic membrane, external ear and ear canal normal.  Left Ear: Tympanic membrane, external ear and ear canal normal.  Nose: Nose normal. No rhinorrhea.  Mouth/Throat: Oropharynx is clear and moist and mucous membranes are normal. No  oropharyngeal exudate or posterior oropharyngeal erythema.  Eyes: Pupils are equal, round, and reactive to light. Conjunctivae and EOM are normal.  Neck: Normal range of motion. Neck supple.  Cardiovascular: Normal rate, regular rhythm, normal heart sounds and intact distal pulses.  Pulmonary/Chest: Effort normal and breath sounds normal.  Abdominal: Soft. Bowel sounds are normal.  Neurological: She is alert and oriented to person, place, and time. She has normal reflexes.  Skin: Skin is warm and dry. No rash noted.  Psychiatric: She has a normal mood and affect. Her behavior is normal. Judgment and thought content normal.        Assessment & Plan:   1. Well adult exam - CMP14+EGFR - CBC with Differential/Platelet - Lipid panel - TSH - Bayer DCA Hb A1c Waived  2. Controlled type 2 diabetes mellitus without complication, without long-term current use of insulin (HCC) - Bayer DCA Hb A1c Waived   Continue all other maintenance medications as listed above.  Follow up plan: No follow-ups on file.  Educational handout given for [place] PA-C [facility] [address]  [place], [place] [address] [phone]   09/06/2017, 11:59 AM

## 2017-09-06 NOTE — Addendum Note (Signed)
Addended by: Terald Sleeper on: 09/06/2017 12:04 PM   Modules accepted: Orders

## 2017-10-13 ENCOUNTER — Other Ambulatory Visit: Payer: Self-pay | Admitting: Physician Assistant

## 2017-10-13 MED ORDER — BUPROPION HCL ER (XL) 300 MG PO TB24: 300.0000 mg | ORAL_TABLET | Freq: Every day | ORAL | 3 refills | Status: DC

## 2017-10-13 MED FILL — buPROPion HCL ER (XL) 300 M: 300 | 90 days supply | Qty: 90 | Fill #0

## 2017-10-13 MED FILL — SHIPPING COST: 1 days supply | Qty: 1 | Fill #3

## 2017-10-26 ENCOUNTER — Encounter: Payer: Self-pay | Admitting: Family Medicine

## 2017-10-26 ENCOUNTER — Ambulatory Visit (INDEPENDENT_AMBULATORY_CARE_PROVIDER_SITE_OTHER): Payer: 59 | Admitting: Family Medicine

## 2017-10-26 VITALS — BP 143/85 | HR 120 | Temp 97.9°F | Ht 62.0 in | Wt 173.0 lb

## 2017-10-26 DIAGNOSIS — T63481A Toxic effect of venom of other arthropod, accidental (unintentional), initial encounter: Secondary | ICD-10-CM

## 2017-10-26 MED ORDER — PREDNISONE 10 MG (21) PO TBPK: ORAL_TABLET | Freq: Every day | ORAL | 0 refills | Status: DC

## 2017-10-26 MED ORDER — HYDROXYZINE HCL 50 MG PO TABS: 50.0000 mg | ORAL_TABLET | Freq: Four times a day (QID) | ORAL | 0 refills | Status: DC | PRN

## 2017-10-26 NOTE — Progress Notes (Signed)
   HPI  Patient presents today here for insect sting.  She states that she was stung by something like a bee or wasp about an hour ago.  She does not have any throat swelling, wheezing, coughing, shortness of breath, or feeling faint.  She is very itchy on the hands and feet. She was given 50 mg of Benadryl upon arrival. She is anxious  PMH: Smoking status noted ROS: Per HPI  Objective: BP (!) 143/85   Pulse (!) 120   Temp 97.9 F (36.6 C) (Oral)   Ht 5\' 2"  (1.575 m)   Wt 173 lb (78.5 kg)   BMI 31.64 kg/m  Gen: NAD, alert, cooperative with exam, anxious appearing HEENT: NCAT CV: RRR, good S1/S2, no murmur Resp: CTABL, no wheezes, non-labored Ext: No edema, warm Neuro: Alert and oriented, No gross deficits Skin :  R mid upper back with erythematous area consistent with wasp sting, approximately 3 cm of erythema with mild induration centrally located  Assessment and plan:  #Insect sting Patient with insect sting, no signs of severe allergic reaction, no signs of anaphylaxis Given 50 mg of Benadryl upon arrival Given hydroxyzine for itching Also given prednisone in case her symptoms are persistent, we discussed reasons to use it.  Also detailed signs of anaphylaxis, she will seek emergency medical care with any of the signs   Meds ordered this encounter  Medications  . hydrOXYzine (ATARAX/VISTARIL) 50 MG tablet    Sig: Take 1 tablet (50 mg total) by mouth every 6 (six) hours as needed for itching.    Dispense:  30 tablet    Refill:  0  . predniSONE (STERAPRED UNI-PAK 21 TAB) 10 MG (21) TBPK tablet    Sig: Take by mouth daily. As directed x 6 days    Dispense:  21 tablet    Refill:  0    Laroy Apple, MD [facility] 10/26/2017, 8:32 AM

## 2017-10-26 NOTE — Patient Instructions (Signed)
Great to see you!   Bee, Wasp, or [employer], Adult Bees, wasps, and hornets are part of a family of insects that can sting people. These stings can cause pain and inflammation, but they are usually not serious. However, some people may have an allergic reaction to a sting. This can cause the symptoms to be more severe. What increases the risk? You may be at a greater risk of getting stung if you:  Provoke a stinging insect by swatting or disturbing it.  Wear strong-smelling soaps, deodorants, or body sprays.  Spend time outdoors near gardens with flowers or fruit trees or in clothes that expose skin.  Eat or drink outside.  What are the signs or symptoms? Common symptoms of this condition include:  A red lump in the skin that sometimes has a tiny hole in the center. In some cases, a stinger may be in the center of the wound.  Pain and itching at the sting site.  Redness and swelling around the sting site. If you have an allergic reaction (localized allergic reaction), the swelling and redness may spread out from the sting site. In some cases, this reaction can continue to develop over the next 24-48 hours.  In rare cases, a person may have a severe allergic reaction (anaphylactic reaction) to a sting. Symptoms of an anaphylactic reaction may include:  Wheezing or difficulty breathing.  Raised, itchy, red patches on the skin (hives).  Nausea or vomiting.  Abdominal cramping.  Diarrhea.  Tightness in the chest or chest pain.  Dizziness or fainting.  Redness of the face (flushing).  Hoarse voice.  Swollen tongue, lips, or face.  How is this diagnosed? This condition is usually diagnosed based on your symptoms and medical history as well as a physical exam. You may have an allergy test to determine if you are allergic to the substance that the insect injected during the sting (venom). How is this treated? If you were stung by a bee, the stinger and a small sac of  venom may be in the wound. It is important to remove the stinger as soon as possible. You can do this by brushing across the wound with gauze, a fingernail, or a flat card such as a credit card. Removing the stinger can help reduce the severity of your body's reaction to the sting. Most stings can be treated with:  Icing to reduce swelling in the area.  Medicines (antihistamines) to treat itching or an allergic reaction.  Medicines to help reduce pain. These may be medicines that you take by mouth, or medicated creams or lotions that you apply to your skin.  Pay close attention to your symptoms after you have been stung. If possible, have someone stay with you to make sure you do not have an allergic reaction. If you have any signs of an allergic reaction, call your health care provider. If you have ever had a severe allergic reaction, your health care provider may give you an inhaler or injectable medicine (epinephrine auto-injector) to use if necessary. Follow these instructions at home:  Wash the sting site 2-3 times each day with soap and water as told by your health care provider.  Apply or take over-the-counter and prescription medicines only as told by your health care provider.  If directed, apply ice to the sting area. ? Put ice in a plastic bag. ? Place a towel between your skin and the bag. ? Leave the ice on for 20 minutes, 2-3 times a day.  Do not scratch the sting area.  If you had a severe allergic reaction to a sting, you may need: ? To wear a medical bracelet or necklace that lists the allergy. ? To learn when and how to use an anaphylaxis kit or epinephrine injection. Your family members and coworkers may also need to learn this. ? To carry an anaphylaxis kit or epinephrine injection with you at all times. How is this prevented?  Avoid swatting at stinging insects and disturbing insect nests.  Do not use fragrant soaps or lotions.  Wear shoes, pants, and long  sleeves when spending time outdoors, especially in grassy areas where stinging insects are common.  Keep outdoor areas free from nests or hives.  Keep food and drink containers covered when eating outdoors.  Avoid working or sitting near [employer], if possible.  Wear gloves if you are gardening or working outdoors.  If an attack by a stinging insect or a swarm seems likely in the moment, move away from the area or find a barrier between you and the insect(s), such as a door. Contact a health care provider if:  Your symptoms do not get better in 2-3 days.  You have redness, swelling, or pain that spreads beyond the area of the sting.  You have a fever. Get help right away if: You have symptoms of a severe allergic reaction. These include:  Wheezing or difficulty breathing.  Tightness in the chest or chest pain.  Light-headedness or fainting.  Itchy, raised, red patches on the skin.  Nausea or vomiting.  Abdominal cramping.  Diarrhea.  A swollen tongue or lips, or trouble swallowing.  Dizziness or fainting.  Summary  Stings from bees, wasps, and hornets can cause pain and inflammation, but they are usually not serious. However, some people may have an allergic reaction to a sting. This can cause the symptoms to be more severe.  Pay close attention to your symptoms after you have been stung. If possible, have someone stay with you to make sure you do not have an allergic reaction.  Call your health care provider if you have any signs of an allergic reaction. This information is not intended to replace advice given to you by your health care provider. Make sure you discuss any questions you have with your health care provider. Document Released: 04/04/2005 Document Revised: 06/09/2016 Document Reviewed: 06/09/2016 Elsevier Interactive Patient Education  [employer].

## 2017-12-05 ENCOUNTER — Other Ambulatory Visit: Payer: Self-pay | Admitting: *Deleted

## 2017-12-05 MED ORDER — AZITHROMYCIN 250 MG PO TABS: ORAL_TABLET | ORAL | 0 refills | Status: DC

## 2017-12-05 NOTE — Progress Notes (Signed)
Per Glenard Haring please send zpak for patient. Rx sent to [facility]

## 2017-12-21 ENCOUNTER — Other Ambulatory Visit: Payer: Self-pay

## 2017-12-21 DIAGNOSIS — I10 Essential (primary) hypertension: Secondary | ICD-10-CM

## 2017-12-21 MED ORDER — METFORMIN HCL 500 MG PO TABS: 500.0000 mg | ORAL_TABLET | Freq: Two times a day (BID) | ORAL | 0 refills | Status: DC

## 2017-12-21 MED ORDER — LISINOPRIL 20 MG PO TABS: 30.0000 mg | ORAL_TABLET | Freq: Every day | ORAL | 1 refills | Status: DC

## 2017-12-21 MED FILL — SHIPPING COST: 1 days supply | Qty: 1 | Fill #4

## 2017-12-21 MED FILL — metFORMIN HCL 500 MG TABS: 500 | 90 days supply | Qty: 180 | Fill #0

## 2017-12-21 MED FILL — LISINOPRIL 20 MG TABLET: 20 | 90 days supply | Qty: 135 | Fill #0

## 2018-01-02 ENCOUNTER — Other Ambulatory Visit: Payer: Self-pay | Admitting: Physician Assistant

## 2018-01-02 ENCOUNTER — Ambulatory Visit (INDEPENDENT_AMBULATORY_CARE_PROVIDER_SITE_OTHER): Payer: 59 | Admitting: *Deleted

## 2018-01-02 DIAGNOSIS — J209 Acute bronchitis, unspecified: Secondary | ICD-10-CM | POA: Diagnosis not present

## 2018-01-02 MED ORDER — LORATADINE 10 MG PO TABS: 10.0000 mg | ORAL_TABLET | Freq: Every day | ORAL | 11 refills | Status: DC

## 2018-01-02 MED ORDER — METHYLPREDNISOLONE ACETATE 80 MG/ML IJ SUSP
80.0000 mg | Freq: Once | INTRAMUSCULAR | Status: AC
  Administered 2018-01-02: 80 mg via INTRAMUSCULAR

## 2018-01-02 MED ORDER — FLUTICASONE PROPIONATE 50 MCG/ACT NA SUSP: 2.0000 | Freq: Every day | NASAL | 11 refills | Status: DC

## 2018-01-02 NOTE — Progress Notes (Signed)
DepMedrol given and patient tolerated well.

## 2018-01-09 ENCOUNTER — Other Ambulatory Visit: Payer: Self-pay | Admitting: *Deleted

## 2018-01-09 DIAGNOSIS — F3342 Major depressive disorder, recurrent, in full remission: Secondary | ICD-10-CM

## 2018-01-09 MED ORDER — SERTRALINE HCL 100 MG PO TABS: ORAL_TABLET | ORAL | 1 refills | Status: DC

## 2018-01-09 MED FILL — SERTRALINE HCL 100 MG TAB: 100 | 90 days supply | Qty: 135 | Fill #0

## 2018-01-09 MED FILL — SHIPPING COST: 1 days supply | Qty: 1 | Fill #5

## 2018-04-09 ENCOUNTER — Other Ambulatory Visit: Payer: Self-pay | Admitting: *Deleted

## 2018-04-09 DIAGNOSIS — I10 Essential (primary) hypertension: Secondary | ICD-10-CM

## 2018-04-09 MED ORDER — LISINOPRIL 20 MG PO TABS: 30.0000 mg | ORAL_TABLET | Freq: Every day | ORAL | 1 refills | Status: DC

## 2018-04-09 MED ORDER — METFORMIN HCL 500 MG PO TABS: 500.0000 mg | ORAL_TABLET | Freq: Two times a day (BID) | ORAL | 1 refills | Status: DC

## 2018-04-09 MED FILL — SERTRALINE HCL 100 MG TAB: 100 | 90 days supply | Qty: 135 | Fill #1

## 2018-04-09 MED FILL — SHIPPING COST: 1 days supply | Qty: 1 | Fill #6

## 2018-04-09 MED FILL — metFORMIN HCL 500 MG TABS: 500 | 90 days supply | Qty: 180 | Fill #0

## 2018-04-09 MED FILL — LISINOPRIL 20 MG TABLET: 20 | 90 days supply | Qty: 135 | Fill #0

## 2018-04-13 ENCOUNTER — Other Ambulatory Visit: Payer: Self-pay | Admitting: Nurse Practitioner

## 2018-04-13 MED ORDER — FLUCONAZOLE 150 MG PO TABS: 150.0000 mg | ORAL_TABLET | Freq: Once | ORAL | 0 refills | Status: AC

## 2018-04-13 MED ORDER — NITROFURANTOIN MONOHYD MACRO 100 MG PO CAPS: 100.0000 mg | ORAL_CAPSULE | Freq: Two times a day (BID) | ORAL | 0 refills | Status: DC

## 2018-04-19 ENCOUNTER — Other Ambulatory Visit: Payer: Self-pay | Admitting: Physician Assistant

## 2018-04-19 MED ORDER — CIPROFLOXACIN HCL 500 MG PO TABS: 500.0000 mg | ORAL_TABLET | Freq: Two times a day (BID) | ORAL | 0 refills | Status: DC

## 2018-04-19 MED ORDER — FLUCONAZOLE 150 MG PO TABS: ORAL_TABLET | ORAL | 0 refills | Status: DC

## 2018-05-08 ENCOUNTER — Other Ambulatory Visit: Payer: 59

## 2018-05-08 ENCOUNTER — Other Ambulatory Visit: Payer: Self-pay | Admitting: Family Medicine

## 2018-05-08 DIAGNOSIS — R6889 Other general symptoms and signs: Secondary | ICD-10-CM

## 2018-05-09 DIAGNOSIS — R05 Cough: Secondary | ICD-10-CM | POA: Diagnosis not present

## 2018-05-09 DIAGNOSIS — R6889 Other general symptoms and signs: Secondary | ICD-10-CM | POA: Diagnosis not present

## 2018-05-09 LAB — VERITOR FLU A/B WAIVED
Influenza A: NEGATIVE
Influenza B: NEGATIVE

## 2018-05-11 ENCOUNTER — Ambulatory Visit: Payer: 59 | Admitting: Family Medicine

## 2018-05-11 ENCOUNTER — Ambulatory Visit (INDEPENDENT_AMBULATORY_CARE_PROVIDER_SITE_OTHER): Payer: 59

## 2018-05-11 ENCOUNTER — Encounter: Payer: Self-pay | Admitting: Family Medicine

## 2018-05-11 VITALS — BP 130/86 | HR 102 | Temp 98.6°F | Ht 62.0 in | Wt 180.0 lb

## 2018-05-11 DIAGNOSIS — R6889 Other general symptoms and signs: Secondary | ICD-10-CM | POA: Diagnosis not present

## 2018-05-11 DIAGNOSIS — R059 Cough, unspecified: Secondary | ICD-10-CM

## 2018-05-11 DIAGNOSIS — R05 Cough: Secondary | ICD-10-CM | POA: Diagnosis not present

## 2018-05-11 MED ORDER — GUAIFENESIN-CODEINE 100-10 MG/5ML PO SOLN: 5.0000 mL | Freq: Four times a day (QID) | ORAL | 0 refills | Status: DC | PRN

## 2018-05-11 MED ORDER — BENZONATATE 100 MG PO CAPS: 100.0000 mg | ORAL_CAPSULE | Freq: Three times a day (TID) | ORAL | 1 refills | Status: DC | PRN

## 2018-05-11 MED ORDER — AZELASTINE HCL 0.1 % NA SOLN: 2.0000 | Freq: Two times a day (BID) | NASAL | 1 refills | Status: DC

## 2018-05-11 NOTE — Patient Instructions (Signed)

## 2018-05-11 NOTE — Progress Notes (Signed)
Subjective: CC: flu like illness PCP: Terald Sleeper, PA-[place] [facility]:WVPX [name] is a 53 y.o. female presenting to clinic today for:  1. Flu like illness Patient has had onset of flulike illness that started 3 days ago.  She notes that she had myalgia, headache, cough.  She feels like her heart has been racing.  She reports fatigue.  Denies any lower extremity edema, calf pain, recent travel.  No hemoptysis.  No measured fevers.  She has not used anything for symptoms because she thought it was the flu and thought that perhaps nothing would help.  Additionally, she does not use benzodiazepine and has not used it in quite some time.   ROS: Per HPI  Allergies  Allergen Reactions  . Penicillins Anaphylaxis  . Sulfa Antibiotics Rash   Past Medical History:  Diagnosis Date  . Anxiety   . Diabetes mellitus without complication ([place])   . Elevated heart rate and blood pressure   . Esophageal reflux   . Migraines     Current Outpatient Medications:  .  ALPRAZolam (XANAX) 0.5 MG tablet, Take 1 tablet (0.5 mg total) by mouth 2 (two) times daily as needed for anxiety., Disp: 60 tablet, Rfl: 0 .  fluticasone (FLONASE) 50 MCG/ACT nasal spray, Place 2 sprays into both nostrils daily., Disp: 16 g, Rfl: 11 .  lisinopril (PRINIVIL,ZESTRIL) 20 MG tablet, Take 1.5 tablets (30 mg total) by mouth daily., Disp: 135 tablet, Rfl: 1 .  loratadine (CLARITIN) 10 MG tablet, Take 1 tablet (10 mg total) by mouth daily., Disp: 30 tablet, Rfl: 11 .  metFORMIN (GLUCOPHAGE) 500 MG tablet, Take 1 tablet (500 mg total) by mouth 2 (two) times daily with a meal., Disp: 180 tablet, Rfl: 1 .  omeprazole (PRILOSEC) 20 MG capsule, Take 1 capsule (20 mg total) by mouth 2 (two) times daily before a meal., Disp: 180 capsule, Rfl: 3 .  sertraline (ZOLOFT) 100 MG tablet, Take 1 and 1/2 tabs daily, Disp: 135 tablet, Rfl: 1 .  valACYclovir (VALTREX) 1000 MG tablet, Take 1 tablet (1,000 mg total) by mouth 2 (two) times daily.,  Disp: 20 tablet, Rfl: 0 Social History   Socioeconomic History  . Marital status: Widowed    Spouse name: Not on file  . Number of children: Not on file  . Years of education: Not on file  . Highest education level: Not on file  Occupational History  . Not on file  Social Needs  . Financial resource strain: Not on file  . Food insecurity:    Worry: Not on file    Inability: Not on file  . Transportation needs:    Medical: Not on file    Non-medical: Not on file  Tobacco Use  . Smoking status: Current Every Day Smoker  . Smokeless tobacco: Never Used  Substance and Sexual Activity  . Alcohol use: Yes    Alcohol/week: 2.0 standard drinks    Types: 2 Standard drinks or equivalent per week  . Drug use: No  . Sexual activity: Yes  Lifestyle  . Physical activity:    Days per week: Not on file    Minutes per session: Not on file  . Stress: Not on file  Relationships  . Social connections:    Talks on phone: Not on file    Gets together: Not on file    Attends religious service: Not on file    Active member of club or organization: Not on file    Attends  meetings of clubs or organizations: Not on file    Relationship status: Not on file  . Intimate partner violence:    Fear of current or ex partner: Not on file    Emotionally abused: Not on file    Physically abused: Not on file    Forced sexual activity: Not on file  Other Topics Concern  . Not on file  Social History Narrative  . Not on file   Family History  Problem Relation Age of Onset  . Breast cancer Mother   . Cancer Mother        breast  . Hypertension Mother   . Diabetes Father   . Diabetes Paternal Uncle   . Hypertension Daughter   . Hyperlipidemia Daughter     Objective: Office vital signs reviewed. BP 130/86   Pulse (!) 102   Temp 98.6 F (37 Tracy) (Oral)   Ht 5\' 2"  (1.575 m)   Wt 180 lb (81.6 kg)   SpO2 97%   BMI 32.92 kg/m   Physical Examination:  General: Awake, alert, tired appearing,  No acute distress HEENT:     Neck: No masses palpated. No lymphadenopathy    Ears: Tympanic membranes intact, normal light reflex, no erythema, no bulging    Eyes: extraocular membranes intact, sclera white    Nose: nasal turbinates moist, clear nasal discharge    Throat: moist mucus membranes, cobblestone appearance of the oropharynx, no tonsillar exudate.  Airway is patent Cardio: regular rate and rhythm, S1S2 heard, no murmurs appreciated Pulm: clear to auscultation bilaterally, mild intermittent expiratory wheezes noted in the left upper lobe.  No rhonchi or rales; normal work of breathing on room air Skin: clammy  Dg Chest 2 View  Result Date: 05/11/2018 CLINICAL DATA:  53 year old female with cough. EXAM: CHEST - 2 VIEW COMPARISON:  Chest radiographs 07/03/2015. FINDINGS: Lung volumes and mediastinal contours are stable and within normal limits. Visualized tracheal air column is within normal limits. Lung markings are stable in both lungs appear clear. No pneumothorax or pleural effusion. Lower cervical ACDF. No acute osseous abnormality identified. Negative visible bowel gas pattern. IMPRESSION: Negative.  No acute cardiopulmonary abnormality. Electronically Signed   By: Genevie Ann M.D.   On: 05/11/2018 10:43   Assessment/ Plan: 53 y.o. female   1. Flu-like symptoms Previous rapid flu was negative.  She is slightly tachycardic during exam but demonstrates no signs or symptoms otherwise concerning for DVT.  Question mild dehydration given illness.  Continue supportive care.  Recommend oral NSAID for myalgia.  I have given her Tessalon Perles and Robitussin-AC for cough.  We discussed uses of the Robitussin-AC.  She not to use the medication if needing to drive for or work.  May use Tessalon Perles during daytime.  Push oral fluids.  Astelin nasal spray prescribed for associated rhinorrhea.  2. Cough Because of focal intermittent expiratory wheezes noted in the left upper lobe, x-ray was  obtained to further evaluate.  Personal review of x-ray demonstrated no acute pulmonary infiltrate suggestive of pneumonia or other processes.  Radiology review confirms this.  Cough likely related to viral URI.  Cough medication as above. - DG Chest 2 View; Future   Orders Placed This Encounter  Procedures  . DG Chest 2 View    Standing Status:   Future    Number of Occurrences:   1    Standing Expiration Date:   07/11/2019    Order Specific Question:   Reason for Exam (SYMPTOM  OR DIAGNOSIS REQUIRED)    Answer:   cough    Order Specific Question:   Is the patient pregnant?    Answer:   No    Order Specific Question:   Preferred imaging location?    Answer:   Internal   Meds ordered this encounter  Medications  . benzonatate (TESSALON PERLES) 100 MG capsule    Sig: Take 1-2 capsules (100-200 mg total) by mouth 3 (three) times daily as needed for cough.    Dispense:  20 capsule    Refill:  1  . guaiFENesin-codeine 100-10 MG/5ML syrup    Sig: Take 5 mLs by mouth every 6 (six) hours as needed for cough.    Dispense:  100 mL    Refill:  0  . azelastine (ASTELIN) 0.1 % nasal spray    Sig: Place 2 sprays into both nostrils 2 (two) times daily. Use in each nostril as directed    Dispense:  30 mL    Refill:  1   The Narcotic Database has been reviewed.  There were no red flags.     [name], DO [place] [phone]

## 2018-05-22 ENCOUNTER — Encounter: Payer: Self-pay | Admitting: Family Medicine

## 2018-05-22 ENCOUNTER — Ambulatory Visit: Payer: 59 | Admitting: Family Medicine

## 2018-05-22 VITALS — BP 132/89 | HR 95 | Temp 97.4°F | Ht 62.0 in | Wt 180.0 lb

## 2018-05-22 DIAGNOSIS — R300 Dysuria: Secondary | ICD-10-CM

## 2018-05-22 DIAGNOSIS — R350 Frequency of micturition: Secondary | ICD-10-CM

## 2018-05-22 DIAGNOSIS — N3001 Acute cystitis with hematuria: Secondary | ICD-10-CM

## 2018-05-22 MED ORDER — PHENAZOPYRIDINE HCL 100 MG PO TABS: 100.0000 mg | ORAL_TABLET | Freq: Three times a day (TID) | ORAL | 0 refills | Status: AC | PRN

## 2018-05-22 MED ORDER — CIPROFLOXACIN HCL 500 MG PO TABS: 500.0000 mg | ORAL_TABLET | Freq: Two times a day (BID) | ORAL | 0 refills | Status: DC

## 2018-05-22 NOTE — Patient Instructions (Signed)
Urinary Tract Infection, Adult A urinary tract infection (UTI) is an infection of any part of the urinary tract. The urinary tract includes:  The kidneys.  The ureters.  The bladder.  The urethra. These organs make, store, and get rid of pee (urine) in the body. What are the causes? This is caused by germs (bacteria) in your genital area. These germs grow and cause swelling (inflammation) of your urinary tract. What increases the risk? You are more likely to develop this condition if:  You have a small, thin tube (catheter) to drain pee.  You cannot control when you pee or poop (incontinence).  You are female, and: ? You use these methods to prevent pregnancy: ? A medicine that kills sperm (spermicide). ? A device that blocks sperm (diaphragm). ? You have low levels of a female hormone (estrogen). ? You are pregnant.  You have genes that add to your risk.  You are sexually active.  You take antibiotic medicines.  You have trouble peeing because of: ? A prostate that is bigger than normal, if you are female. ? A blockage in the part of your body that drains pee from the bladder (urethra). ? A kidney stone. ? A nerve condition that affects your bladder (neurogenic bladder). ? Not getting enough to drink. ? Not peeing often enough.  You have other conditions, such as: ? Diabetes. ? A weak disease-fighting system (immune system). ? Sickle cell disease. ? Gout. ? Injury of the spine. What are the signs or symptoms? Symptoms of this condition include:  Needing to pee right away (urgently).  Peeing often.  Peeing small amounts often.  Pain or burning when peeing.  Blood in the pee.  Pee that smells bad or not like normal.  Trouble peeing.  Pee that is cloudy.  Fluid coming from the vagina, if you are female.  Pain in the belly or lower back. Other symptoms include:  Throwing up (vomiting).  No urge to eat.  Feeling mixed up (confused).  Being tired  and grouchy (irritable).  A fever.  Watery poop (diarrhea). How is this treated? This condition may be treated with:  Antibiotic medicine.  Other medicines.  Drinking enough water. Follow these instructions at home:  Medicines  Take over-the-counter and prescription medicines only as told by your doctor.  If you were prescribed an antibiotic medicine, take it as told by your doctor. Do not stop taking it even if you start to feel better. General instructions  Make sure you: ? Pee until your bladder is empty. ? Do not hold pee for a long time. ? Empty your bladder after sex. ? Wipe from front to back after pooping if you are a female. Use each tissue one time when you wipe.  Drink enough fluid to keep your pee pale yellow.  Keep all follow-up visits as told by your doctor. This is important. Contact a doctor if:  You do not get better after 1-2 days.  Your symptoms go away and then come back. Get help right away if:  You have very bad back pain.  You have very bad pain in your lower belly.  You have a fever.  You are sick to your stomach (nauseous).  You are throwing up. Summary  A urinary tract infection (UTI) is an infection of any part of the urinary tract.  This condition is caused by germs in your genital area.  There are many risk factors for a UTI. These include having a small, thin   tube to drain pee and not being able to control when you pee or poop.  Treatment includes antibiotic medicines for germs.  Drink enough fluid to keep your pee pale yellow. This information is not intended to replace advice given to you by your health care provider. Make sure you discuss any questions you have with your health care provider. Document Released: 09/21/2007 Document Revised: 10/12/2017 Document Reviewed: 10/12/2017 Elsevier Interactive Patient Education  2019 [employer]  

## 2018-05-22 NOTE — Progress Notes (Signed)
Subjective:  Patient ID: [name], female    DOB: 12-Jul-1965, 53 y.o.   MRN: [id number]  Chief Complaint:  Urinary Tract Infection   HPI: [name] is a 53 y.o. female presenting on 05/22/2018 for Urinary Tract Infection  Pt presents today with complaints of decreased urine output, urgency, and dysuria. She states this started yesterday and is progressively getting worse. She denies fever, weakness, confusion, flank pain, or abdominal pain. No vaginal symptoms. She has not taken anything for the symptoms.   Relevant past medical, surgical, family, and social history reviewed and updated as indicated.  Allergies and medications reviewed and updated.   Past Medical History:  Diagnosis Date  . Anxiety   . Diabetes mellitus without complication ([place])   . Elevated heart rate and blood pressure   . Esophageal reflux   . Migraines     Past Surgical History:  Procedure Laterality Date  . LAPAROSCOPIC VAGINAL HYSTERECTOMY N/A 05 10 2012    Social History   Socioeconomic History  . Marital status: Widowed    Spouse name: Not on file  . Number of children: Not on file  . Years of education: Not on file  . Highest education level: Not on file  Occupational History  . Not on file  Social Needs  . Financial resource strain: Not on file  . Food insecurity:    Worry: Not on file    Inability: Not on file  . Transportation needs:    Medical: Not on file    Non-medical: Not on file  Tobacco Use  . Smoking status: Current Every Day Smoker  . Smokeless tobacco: Never Used  Substance and Sexual Activity  . Alcohol use: Yes    Alcohol/week: 2.0 standard drinks    Types: 2 Standard drinks or equivalent per week  . Drug use: No  . Sexual activity: Yes  Lifestyle  . Physical activity:    Days per week: Not on file    Minutes per session: Not on file  . Stress: Not on file  Relationships  . Social connections:    Talks on phone: Not on file    Gets together:  Not on file    Attends religious service: Not on file    Active member of club or organization: Not on file    Attends meetings of clubs or organizations: Not on file    Relationship status: Not on file  . Intimate partner violence:    Fear of current or ex partner: Not on file    Emotionally abused: Not on file    Physically abused: Not on file    Forced sexual activity: Not on file  Other Topics Concern  . Not on file  Social History Narrative  . Not on file    Outpatient Encounter Medications as of 05/22/2018  Medication Sig  . ALPRAZolam (XANAX) 0.5 MG tablet Take 1 tablet (0.5 mg total) by mouth 2 (two) times daily as needed for anxiety.  Marland Kitchen azelastine (ASTELIN) 0.1 % nasal spray Place 2 sprays into both nostrils 2 (two) times daily. Use in each nostril as directed  . benzonatate (TESSALON PERLES) 100 MG capsule Take 1-2 capsules (100-200 mg total) by mouth 3 (three) times daily as needed for cough.  . ciprofloxacin (CIPRO) 500 MG tablet Take 1 tablet (500 mg total) by mouth 2 (two) times daily for 5 days.  . fluticasone (FLONASE) 50 MCG/ACT nasal spray Place 2 sprays into both nostrils  daily.  . guaiFENesin-codeine 100-10 MG/5ML syrup Take 5 mLs by mouth every 6 (six) hours as needed for cough.  Marland Kitchen lisinopril (PRINIVIL,ZESTRIL) 20 MG tablet Take 1.5 tablets (30 mg total) by mouth daily.  Marland Kitchen loratadine (CLARITIN) 10 MG tablet Take 1 tablet (10 mg total) by mouth daily.  . metFORMIN (GLUCOPHAGE) 500 MG tablet Take 1 tablet (500 mg total) by mouth 2 (two) times daily with a meal.  . omeprazole (PRILOSEC) 20 MG capsule Take 1 capsule (20 mg total) by mouth 2 (two) times daily before a meal.  . phenazopyridine (PYRIDIUM) 100 MG tablet Take 1 tablet (100 mg total) by mouth 3 (three) times daily as needed for up to 3 days for pain.  Marland Kitchen sertraline (ZOLOFT) 100 MG tablet Take 1 and 1/2 tabs daily  . valACYclovir (VALTREX) 1000 MG tablet Take 1 tablet (1,000 mg total) by mouth 2 (two) times  daily.   No facility-administered encounter medications on file as of 05/22/2018.     Allergies  Allergen Reactions  . Penicillins Anaphylaxis  . Sulfa Antibiotics Rash    Review of Systems  Constitutional: Negative for chills, fatigue and fever.  Gastrointestinal: Negative for abdominal pain, nausea and vomiting.  Genitourinary: Positive for decreased urine volume, dysuria, frequency, hematuria and urgency. Negative for vaginal bleeding, vaginal discharge and vaginal pain.  Neurological: Negative for weakness.  Psychiatric/Behavioral: Negative for confusion.  All other systems reviewed and are negative.       Objective:  BP 132/89   Pulse 95   Temp (!) 97.4 F (36.3 C) (Oral)   Ht 5\' 2"  (1.575 m)   Wt 180 lb (81.6 kg)   BMI 32.92 kg/m    Wt Readings from Last 3 Encounters:  05/22/18 180 lb (81.6 kg)  05/11/18 180 lb (81.6 kg)  10/26/17 173 lb (78.5 kg)    Physical Exam Vitals signs and nursing note reviewed.  Constitutional:      General: She is in acute distress (mild).     Appearance: Normal appearance. She is well-developed and well-groomed. She is not ill-appearing or toxic-appearing.  HENT:     Head: Normocephalic and atraumatic.     Mouth/Throat:     Mouth: Mucous membranes are moist.     Pharynx: Oropharynx is clear.  Eyes:     Conjunctiva/sclera: Conjunctivae normal.     Pupils: Pupils are equal, round, and reactive to light.  Cardiovascular:     Rate and Rhythm: Normal rate and regular rhythm.     Heart sounds: Normal heart sounds.  Pulmonary:     Effort: Pulmonary effort is normal.     Breath sounds: Normal breath sounds.  Abdominal:     General: Bowel sounds are normal.     Palpations: Abdomen is soft.     Tenderness: There is no abdominal tenderness.  Skin:    General: Skin is warm and dry.     Capillary Refill: Capillary refill takes less than 2 seconds.  Neurological:     General: No focal deficit present.     Mental Status: She is alert  and oriented to person, place, and time.  Psychiatric:        Mood and Affect: Mood normal.        Behavior: Behavior normal. Behavior is cooperative.        Thought Content: Thought content normal.        Judgment: Judgment normal.     Results for orders placed or performed in visit on 05/08/18  Veritor Flu A/B Waived  Result Value Ref Range   Influenza A Negative Negative   Influenza B Negative Negative     Urinalysis in office: many bacteria, 2+ leukocytes, 3+ blood, negative nitrites, 1+ protein  Pertinent labs & imaging results that were available during my care of the patient were reviewed by me and considered in my medical decision making.  Assessment & Plan:  [place]ora was seen today for urinary tract infection.  Diagnoses and all orders for this visit:  Acute cystitis with hematuria Culture pending. Increase water intake. Avoid bladder irritants. Medications as prescribed.  -     ciprofloxacin (CIPRO) 500 MG tablet; Take 1 tablet (500 mg total) by mouth 2 (two) times daily for 5 days. -     Urine Culture  Urinary frequency -     Urinalysis, Complete  Dysuria Increase water intake. Avoid bladder irritants. Medications as prescribed.  -     phenazopyridine (PYRIDIUM) 100 MG tablet; Take 1 tablet (100 mg total) by mouth 3 (three) times daily as needed for up to 3 days for pain.     Continue all other maintenance medications.  Follow up plan: Return if symptoms worsen or fail to improve, for Recheck urine in 2-4 weeks.  Educational handout given for UTI  The above assessment and management plan was discussed with the patient. The patient verbalized understanding of and has agreed to the management plan. Patient is aware to call the clinic if symptoms persist or worsen. Patient is aware when to return to the clinic for a follow-up visit. Patient educated on when it is appropriate to go to the emergency department.   Monia Pouch, FNP-C [facility] [phone]

## 2018-05-23 LAB — URINALYSIS, COMPLETE
Bilirubin, UA: NEGATIVE
Glucose, UA: NEGATIVE
Ketones, UA: NEGATIVE
Nitrite, UA: NEGATIVE
Urobilinogen, Ur: 0.2 mg/dL (ref 0.2–1.0)
pH, UA: 5.5 (ref 5.0–7.5)

## 2018-05-23 LAB — MICROSCOPIC EXAMINATION
RBC, UA: >30 /hpf — AB (ref 0–2)
RENAL EPITHEL UA: NONE SEEN /HPF
WBC, UA: >30 /hpf — AB (ref 0–5)

## 2018-05-24 LAB — URINE CULTURE

## 2018-05-25 ENCOUNTER — Other Ambulatory Visit: Payer: Self-pay | Admitting: Family Medicine

## 2018-05-25 DIAGNOSIS — K219 Gastro-esophageal reflux disease without esophagitis: Secondary | ICD-10-CM

## 2018-05-25 MED ORDER — FAMOTIDINE 20 MG PO TABS: 20.0000 mg | ORAL_TABLET | Freq: Two times a day (BID) | ORAL | 3 refills | Status: DC

## 2018-05-25 MED ORDER — CEPHALEXIN 500 MG PO CAPS: 500.0000 mg | ORAL_CAPSULE | Freq: Two times a day (BID) | ORAL | 0 refills | Status: AC

## 2018-05-25 NOTE — Addendum Note (Signed)
Addended by: Baruch Gouty on: 05/25/2018 07:56 AM   Modules accepted: Orders

## 2018-07-30 ENCOUNTER — Other Ambulatory Visit: Payer: Self-pay | Admitting: Physician Assistant

## 2018-07-30 ENCOUNTER — Telehealth: Payer: Self-pay | Admitting: *Deleted

## 2018-07-30 DIAGNOSIS — I10 Essential (primary) hypertension: Secondary | ICD-10-CM

## 2018-07-30 DIAGNOSIS — F3342 Major depressive disorder, recurrent, in full remission: Secondary | ICD-10-CM

## 2018-07-30 MED ORDER — LORATADINE 10 MG PO TABS: 10.0000 mg | ORAL_TABLET | Freq: Every day | ORAL | 3 refills | Status: DC

## 2018-07-30 MED ORDER — METFORMIN HCL 500 MG PO TABS: 500.0000 mg | ORAL_TABLET | Freq: Two times a day (BID) | ORAL | 1 refills | Status: DC

## 2018-07-30 MED ORDER — LISINOPRIL 20 MG PO TABS: 30.0000 mg | ORAL_TABLET | Freq: Every day | ORAL | 3 refills | Status: DC

## 2018-07-30 MED ORDER — SERTRALINE HCL 100 MG PO TABS: 200.0000 mg | ORAL_TABLET | Freq: Every day | ORAL | 3 refills | Status: DC

## 2018-07-30 MED ORDER — FLUTICASONE PROPIONATE 50 MCG/ACT NA SUSP: 2.0000 | Freq: Every day | NASAL | 3 refills | Status: DC

## 2018-07-30 NOTE — Telephone Encounter (Signed)
Medications are sent

## 2018-07-30 NOTE — Telephone Encounter (Signed)
Patient requesting new scripts of flonase,   claritin,  , lisinopril,     metformin ,  and zoloft (taking 200 mg now), to be sent to [facility] for mail order.

## 2018-07-31 ENCOUNTER — Other Ambulatory Visit: Payer: Self-pay | Admitting: Physician Assistant

## 2018-07-31 DIAGNOSIS — F3342 Major depressive disorder, recurrent, in full remission: Secondary | ICD-10-CM

## 2018-07-31 MED ORDER — SERTRALINE HCL 100 MG PO TABS: 200.0000 mg | ORAL_TABLET | Freq: Every day | ORAL | 3 refills | Status: DC

## 2018-07-31 MED FILL — LISINOPRIL 20 MG TABLET: 20 | 90 days supply | Qty: 135 | Fill #0

## 2018-07-31 MED FILL — SERTRALINE HCL 100 MG TAB: 100 | 90 days supply | Qty: 180 | Fill #0

## 2018-07-31 MED FILL — FLUTICASONE PROP 50 MCG SPR: 50 | 30 days supply | Qty: 16 | Fill #0

## 2018-07-31 MED FILL — SM LORATADINE 10 MG TABS: 10 | 90 days supply | Qty: 90 | Fill #0

## 2018-07-31 MED FILL — metFORMIN HCL 500 MG TABS: 500 | 90 days supply | Qty: 180 | Fill #0

## 2018-07-31 NOTE — Telephone Encounter (Signed)
Pt aware.

## 2018-08-02 ENCOUNTER — Other Ambulatory Visit: Payer: Self-pay | Admitting: *Deleted

## 2018-08-02 DIAGNOSIS — F3342 Major depressive disorder, recurrent, in full remission: Secondary | ICD-10-CM

## 2018-08-02 MED ORDER — OMEPRAZOLE 20 MG PO CPDR: 20.0000 mg | DELAYED_RELEASE_CAPSULE | Freq: Two times a day (BID) | ORAL | 3 refills | Status: DC

## 2018-08-02 MED ORDER — SERTRALINE HCL 100 MG PO TABS: 200.0000 mg | ORAL_TABLET | Freq: Every day | ORAL | 3 refills | Status: DC

## 2018-08-02 MED FILL — OMEPRAZOLE 20 MG CPDR: 20 | 90 days supply | Qty: 180 | Fill #0

## 2018-08-20 ENCOUNTER — Ambulatory Visit (INDEPENDENT_AMBULATORY_CARE_PROVIDER_SITE_OTHER): Payer: 59

## 2018-08-20 ENCOUNTER — Encounter: Payer: Self-pay | Admitting: Family Medicine

## 2018-08-20 ENCOUNTER — Other Ambulatory Visit: Payer: Self-pay

## 2018-08-20 ENCOUNTER — Ambulatory Visit (INDEPENDENT_AMBULATORY_CARE_PROVIDER_SITE_OTHER): Payer: 59 | Admitting: Family Medicine

## 2018-08-20 VITALS — BP 155/96 | HR 98 | Temp 98.8°F | Ht 62.0 in | Wt 185.0 lb

## 2018-08-20 DIAGNOSIS — R0781 Pleurodynia: Secondary | ICD-10-CM | POA: Diagnosis not present

## 2018-08-20 DIAGNOSIS — W19XXXA Unspecified fall, initial encounter: Secondary | ICD-10-CM

## 2018-08-20 DIAGNOSIS — S299XXA Unspecified injury of thorax, initial encounter: Secondary | ICD-10-CM | POA: Diagnosis not present

## 2018-08-20 MED ORDER — OXYCODONE-ACETAMINOPHEN 5-325 MG PO TABS: 1.0000 | ORAL_TABLET | Freq: Four times a day (QID) | ORAL | 0 refills | Status: DC | PRN

## 2018-08-20 MED ORDER — KETOROLAC TROMETHAMINE 30 MG/ML IJ SOLN
30.0000 mg | Freq: Once | INTRAMUSCULAR | Status: AC
  Administered 2018-08-20: 30 mg via INTRAMUSCULAR

## 2018-08-20 MED ORDER — TIZANIDINE HCL 4 MG PO TABS: 2.0000 mg | ORAL_TABLET | Freq: Three times a day (TID) | ORAL | 0 refills | Status: DC | PRN

## 2018-08-20 NOTE — Progress Notes (Signed)
Subjective: CC: Fall PCP: Terald Sleeper, PA-C JSH:FWYO C [name] is a 53 y.o. female presenting to clinic today for:  1. Fall Patient sustained a fall down a few steps while she was walking her dog this morning.  She reports severe pain along the posterior aspect of her right rib cage.  She denies any shortness of breath, hemoptysis.  She has not taken anything for the pain.  Denies any discoloration or swelling.   ROS: Per HPI  Allergies  Allergen Reactions  . Penicillins Anaphylaxis  . Sulfa Antibiotics Rash   Past Medical History:  Diagnosis Date  . Anxiety   . Diabetes mellitus without complication ([place])   . Elevated heart rate and blood pressure   . Esophageal reflux   . Migraines     Current Outpatient Medications:  .  ALPRAZolam (XANAX) 0.5 MG tablet, Take 1 tablet (0.5 mg total) by mouth 2 (two) times daily as needed for anxiety., Disp: 60 tablet, Rfl: 0 .  azelastine (ASTELIN) 0.1 % nasal spray, Place 2 sprays into both nostrils 2 (two) times daily. Use in each nostril as directed, Disp: 30 mL, Rfl: 1 .  famotidine (PEPCID) 20 MG tablet, Take 1 tablet (20 mg total) by mouth 2 (two) times daily for 30 days., Disp: 60 tablet, Rfl: 3 .  fluticasone (FLONASE) 50 MCG/ACT nasal spray, Place 2 sprays into both nostrils daily., Disp: 48 g, Rfl: 3 .  lisinopril (PRINIVIL,ZESTRIL) 20 MG tablet, Take 1.5 tablets (30 mg total) by mouth daily., Disp: 135 tablet, Rfl: 3 .  loratadine (CLARITIN) 10 MG tablet, Take 1 tablet (10 mg total) by mouth daily., Disp: 90 tablet, Rfl: 3 .  metFORMIN (GLUCOPHAGE) 500 MG tablet, Take 1 tablet (500 mg total) by mouth 2 (two) times daily with a meal., Disp: 180 tablet, Rfl: 1 .  omeprazole (PRILOSEC) 20 MG capsule, Take 1 capsule (20 mg total) by mouth 2 (two) times daily before a meal., Disp: 180 capsule, Rfl: 3 .  sertraline (ZOLOFT) 100 MG tablet, Take 2 tablets (200 mg total) by mouth daily., Disp: 180 tablet, Rfl: 3 .  valACYclovir  (VALTREX) 1000 MG tablet, Take 1 tablet (1,000 mg total) by mouth 2 (two) times daily., Disp: 20 tablet, Rfl: 0 .  oxyCODONE-acetaminophen (PERCOCET) 5-325 MG tablet, Take 1 tablet by mouth every 6 (six) hours as needed for severe pain., Disp: 20 tablet, Rfl: 0 .  tiZANidine (ZANAFLEX) 4 MG tablet, Take 0.5-1 tablets (2-4 mg total) by mouth every 8 (eight) hours as needed for muscle spasms., Disp: 30 tablet, Rfl: 0 Social History   Socioeconomic History  . Marital status: Widowed    Spouse name: Not on file  . Number of children: Not on file  . Years of education: Not on file  . Highest education level: Not on file  Occupational History  . Not on file  Social Needs  . Financial resource strain: Not on file  . Food insecurity:    Worry: Not on file    Inability: Not on file  . Transportation needs:    Medical: Not on file    Non-medical: Not on file  Tobacco Use  . Smoking status: Current Every Day Smoker  . Smokeless tobacco: Never Used  Substance and Sexual Activity  . Alcohol use: Yes    Alcohol/week: 2.0 standard drinks    Types: 2 Standard drinks or equivalent per week  . Drug use: No  . Sexual activity: Yes  Lifestyle  .  Physical activity:    Days per week: Not on file    Minutes per session: Not on file  . Stress: Not on file  Relationships  . Social connections:    Talks on phone: Not on file    Gets together: Not on file    Attends religious service: Not on file    Active member of club or organization: Not on file    Attends meetings of clubs or organizations: Not on file    Relationship status: Not on file  . Intimate partner violence:    Fear of current or ex partner: Not on file    Emotionally abused: Not on file    Physically abused: Not on file    Forced sexual activity: Not on file  Other Topics Concern  . Not on file  Social History Narrative  . Not on file   Family History  Problem Relation Age of Onset  . Breast cancer Mother   . Cancer Mother         breast  . Hypertension Mother   . Diabetes Father   . Diabetes Paternal Uncle   . Hypertension Daughter   . Hyperlipidemia Daughter     Objective: Office vital signs reviewed. BP (!) 155/96   Pulse 98   Temp 98.8 F (37.1 C) (Oral)   Ht 5\' 2"  (1.575 m)   Wt 185 lb (83.9 kg)   BMI 33.84 kg/m   Physical Examination:  General: Awake, alert, appears to be in pain Pulm: normal work of breathing on room air MSK:   Ribs: She has exquisite tenderness to palpation particularly along the posterior/lateral aspect of rib 7-8.  No palpable deformities, discoloration or soft tissue swelling.  Expansion of the ribs normal albeit painful.  Dg Ribs Unilateral Right  Result Date: 08/20/2018 CLINICAL DATA:  Post fall, now with right posterior rib pain. EXAM: RIGHT RIBS - 2 VIEW COMPARISON:  Chest radiograph-05/11/2018 FINDINGS: Grossly unchanged cardiac silhouette and mediastinal contours. No focal parenchymal opacities. No pleural effusion or pneumothorax. No of edema. No definite displaced right-sided rib fractures. Post lower cervical ACDF, incompletely evaluated. Regional soft tissues appear normal. No radiopaque foreign body. IMPRESSION: No definite displaced right-sided rib fractures. Electronically Signed   By: Sandi Mariscal M.D.   On: 08/20/2018 08:13    Assessment/ Plan: 53 y.o. female   1. Rib pain on right side Given exquisite tenderness to palpation, x-rays were obtained.  No visible fracture or bony abnormalities on personal review of x-ray.  Radiologist confirmed this.  This is likely a bony contusion.  She was given a small supply of Percocet for severe pain.  She was given a dose of Toradol 30 mg intramuscularly.  Okay to continue oral NSAID but I advised her to take this away from her SSRI.  Muscle relaxer provided.  Caution sedation.  We discussed that symptoms would likely worsen over the next couple of days but then subside.  Handout was provided.  Apply ice to the affected  area. - DG Ribs Unilateral Right; Future - oxyCODONE-acetaminophen (PERCOCET) 5-325 MG tablet; Take 1 tablet by mouth every 6 (six) hours as needed for severe pain.  Dispense: 20 tablet; Refill: 0 - tiZANidine (ZANAFLEX) 4 MG tablet; Take 0.5-1 tablets (2-4 mg total) by mouth every 8 (eight) hours as needed for muscle spasms.  Dispense: 30 tablet; Refill: 0 - ketorolac (TORADOL) 30 MG/ML injection 30 mg  2. Fall, initial encounter - DG Ribs Unilateral Right; Future - oxyCODONE-acetaminophen (  PERCOCET) 5-325 MG tablet; Take 1 tablet by mouth every 6 (six) hours as needed for severe pain.  Dispense: 20 tablet; Refill: 0 - tiZANidine (ZANAFLEX) 4 MG tablet; Take 0.5-1 tablets (2-4 mg total) by mouth every 8 (eight) hours as needed for muscle spasms.  Dispense: 30 tablet; Refill: 0 - ketorolac (TORADOL) 30 MG/ML injection 30 mg   Orders Placed This Encounter  Procedures  . DG Ribs Unilateral Right    Standing Status:   Future    Number of Occurrences:   1    Standing Expiration Date:   10/20/2019    Order Specific Question:   Reason for Exam (SYMPTOM  OR DIAGNOSIS REQUIRED)    Answer:   fall/ right post ribpain    Order Specific Question:   Is patient pregnant?    Answer:   No    Order Specific Question:   Preferred imaging location?    Answer:   Internal    Order Specific Question:   Radiology Contrast Protocol - do NOT remove file path    Answer:   [email or web address]   Meds ordered this encounter  Medications  . oxyCODONE-acetaminophen (PERCOCET) 5-325 MG tablet    Sig: Take 1 tablet by mouth every 6 (six) hours as needed for severe pain.    Dispense:  20 tablet    Refill:  0  . tiZANidine (ZANAFLEX) 4 MG tablet    Sig: Take 0.5-1 tablets (2-4 mg total) by mouth every 8 (eight) hours as needed for muscle spasms.    Dispense:  30 tablet    Refill:  0  . ketorolac (TORADOL) 30 MG/ML injection 30 mg     [name], DO [place] [phone]

## 2018-08-20 NOTE — Patient Instructions (Signed)
Contusion    A contusion is a deep bruise. Contusions happen when an injury causes bleeding under the skin. Symptoms of bruising include pain, swelling, and discolored skin. The skin may turn blue, purple, or yellow.  Follow these instructions at home:   Rest the injured area.   If told, put ice on the injured area.  ? Put ice in a plastic bag.  ? Place a towel between your skin and the bag.  ? Leave the ice on for 20 minutes, 2-3 times per day.   If told, put light pressure (compression) on the injured area using an elastic bandage. Make sure the bandage is not too tight. Remove it and put it back on as told by your doctor.   If possible, raise (elevate) the injured area above the level of your heart while you are sitting or lying down.   Take over-the-counter and prescription medicines only as told by your doctor.  Contact a doctor if:   Your symptoms do not get better after several days of treatment.   Your symptoms get worse.   You have trouble moving the injured area.  Get help right away if:   You have very bad pain.   You have a loss of feeling (numbness) in a hand or foot.   Your hand or foot turns pale or cold.  This information is not intended to replace advice given to you by your health care provider. Make sure you discuss any questions you have with your health care provider.  Document Released: 09/21/2007 Document Revised: 09/10/2015 Document Reviewed: 08/20/2014  Elsevier Interactive Patient Education  2019 [employer]

## 2018-08-28 ENCOUNTER — Telehealth: Payer: Self-pay

## 2018-08-28 MED ORDER — DOXYCYCLINE HYCLATE 100 MG PO TABS: 100.0000 mg | ORAL_TABLET | Freq: Two times a day (BID) | ORAL | 0 refills | Status: DC

## 2018-08-28 NOTE — Telephone Encounter (Signed)
Patient has a cyst like area starting under her arm. Will you call in doxycycline before it gets worse? Please review. Wants sent to [facility]

## 2018-09-17 ENCOUNTER — Ambulatory Visit (INDEPENDENT_AMBULATORY_CARE_PROVIDER_SITE_OTHER): Payer: 59

## 2018-09-17 ENCOUNTER — Encounter: Payer: Self-pay | Admitting: Family

## 2018-09-17 ENCOUNTER — Other Ambulatory Visit: Payer: Self-pay

## 2018-09-17 ENCOUNTER — Ambulatory Visit (INDEPENDENT_AMBULATORY_CARE_PROVIDER_SITE_OTHER): Payer: 59 | Admitting: Family

## 2018-09-17 VITALS — BP 131/86 | HR 78 | Temp 98.3°F | Ht 62.0 in | Wt 185.0 lb

## 2018-09-17 DIAGNOSIS — W19XXXD Unspecified fall, subsequent encounter: Secondary | ICD-10-CM

## 2018-09-17 DIAGNOSIS — S2241XA Multiple fractures of ribs, right side, initial encounter for closed fracture: Secondary | ICD-10-CM | POA: Diagnosis not present

## 2018-09-17 DIAGNOSIS — R0781 Pleurodynia: Secondary | ICD-10-CM | POA: Diagnosis not present

## 2018-09-17 DIAGNOSIS — Y92009 Unspecified place in unspecified non-institutional (private) residence as the place of occurrence of the external cause: Secondary | ICD-10-CM

## 2018-09-17 DIAGNOSIS — M546 Pain in thoracic spine: Secondary | ICD-10-CM | POA: Diagnosis not present

## 2018-09-17 MED ORDER — KETOROLAC TROMETHAMINE 60 MG/2ML IM SOLN
60.0000 mg | Freq: Once | INTRAMUSCULAR | Status: AC
  Administered 2018-09-17: 60 mg via INTRAMUSCULAR

## 2018-09-17 MED ORDER — CYCLOBENZAPRINE HCL 10 MG PO TABS: 10.0000 mg | ORAL_TABLET | Freq: Three times a day (TID) | ORAL | 1 refills | Status: DC | PRN

## 2018-09-17 MED ORDER — METHYLPREDNISOLONE ACETATE 80 MG/ML IJ SUSP
80.0000 mg | Freq: Once | INTRAMUSCULAR | Status: AC
  Administered 2018-09-17: 80 mg via INTRAMUSCULAR

## 2018-09-17 NOTE — Progress Notes (Addendum)
Subjective:    Patient ID: [name], female    DOB: 1965/09/30, 53 y.o.   MRN: [id number]  Chief Complaint  Patient presents with  . Chest Pain   PT presents to the office today with recurrent right rib pain that occurred after she fell on a pile of wood 4 weeks ago. She states the pain was improving, but yesterday she noticed recurrent pain in her right flank pain. Flank Pain  This is a recurrent problem. The current episode started 1 to 4 weeks ago. The problem has been waxing and waning since onset. The pain is at a severity of 7/10. The pain is moderate. The symptoms are aggravated by lying down and standing. Associated symptoms include weakness. Pertinent negatives include no bladder incontinence, bowel incontinence, leg pain or numbness. Risk factors include obesity. She has tried muscle relaxant, NSAIDs, bed rest and analgesics for the symptoms. The treatment provided mild relief.      Review of Systems  Gastrointestinal: Negative for bowel incontinence.  Genitourinary: Positive for flank pain. Negative for bladder incontinence.  Neurological: Positive for weakness. Negative for numbness.  All other systems reviewed and are negative.      Objective:   Physical Exam Vitals signs reviewed.  Constitutional:      General: She is not in acute distress.    Appearance: She is well-developed.  HENT:     Head: Normocephalic and atraumatic.     Right Ear: External ear normal.  Eyes:     Pupils: Pupils are equal, round, and reactive to light.  Neck:     Musculoskeletal: Normal range of motion and neck supple.     Thyroid: No thyromegaly.  Cardiovascular:     Rate and Rhythm: Normal rate and regular rhythm.     Heart sounds: Normal heart sounds. No murmur.  Pulmonary:     Effort: Pulmonary effort is normal. No respiratory distress.     Breath sounds: Normal breath sounds. No wheezing.  Abdominal:     General: Bowel sounds are normal. There is no distension.   Palpations: Abdomen is soft.     Tenderness: There is no abdominal tenderness.     Comments: Full ROM of motion, right flank pain with palpation. No bruising noted.   Musculoskeletal: Normal range of motion.        General: No tenderness.  Skin:    General: Skin is warm and dry.       Neurological:     Mental Status: She is alert and oriented to person, place, and time.     Cranial Nerves: No cranial nerve deficit.     Deep Tendon Reflexes: Reflexes are normal and symmetric.  Psychiatric:        Behavior: Behavior normal.        Thought Content: Thought content normal.        Judgment: Judgment normal.     BP 131/86   Pulse 78   Temp 98.3 F (36.8 C) (Oral)   Ht 5\' 2"  (1.575 m)   Wt 185 lb (83.9 kg)   BMI 33.84 kg/m   X-ray- Negative Preliminary reading by Evelina Dun, FNP [facility]      Assessment & Plan:  [name] comes in today with chief complaint of Chest Pain   Diagnosis and orders addressed:  1. Rib pain - DG Ribs Unilateral W/Chest Right; Future - methylPREDNISolone acetate (DEPO-MEDROL) injection 80 mg - ketorolac (TORADOL) injection 60 mg - cyclobenzaprine (FLEXERIL) 10 MG tablet;  Take 1 tablet (10 mg total) by mouth 3 (three) times daily as needed for muscle spasms.  Dispense: 30 tablet; Refill: 1  2. Fall in home, subsequent encounter - DG Ribs Unilateral W/Chest Right; Future - methylPREDNISolone acetate (DEPO-MEDROL) injection 80 mg - ketorolac (TORADOL) injection 60 mg - cyclobenzaprine (FLEXERIL) 10 MG tablet; Take 1 tablet (10 mg total) by mouth 3 (three) times daily as needed for muscle spasms.  Dispense: 30 tablet; Refill: 1   3. Acute right-sided thoracic back pain   Rest Ice ROM exercises encouraged Continue Motrin as needed RTO if symptoms worsen or do not improve    Evelina Dun, FNP

## 2018-09-17 NOTE — Patient Instructions (Addendum)
Costochondritis    Costochondritis is swelling and irritation (inflammation) of the tissue (cartilage) that connects your ribs to your breastbone (sternum). This causes pain in the front of your chest. The pain usually starts gradually and involves more than one rib.  What are the causes?  The exact cause of this condition is not always known. It results from stress on the cartilage where your ribs attach to your sternum. The cause of this stress could be:   Chest injury (trauma).   Exercise or activity, such as lifting.   Severe coughing.  What increases the risk?  You may be at higher risk for this condition if you:   Are female.   Are 30?52 years old.   Recently started a new exercise or work activity.   Have low levels of vitamin D.   Have a condition that makes you cough frequently.  What are the signs or symptoms?  The main symptom of this condition is chest pain. The pain:   Usually starts gradually and can be sharp or dull.   Gets worse with deep breathing, coughing, or exercise.   Gets better with rest.   May be worse when you press on the sternum-rib connection (tenderness).  How is this diagnosed?  This condition is diagnosed based on your symptoms, medical history, and a physical exam. Your health care provider will check for tenderness when pressing on your sternum. This is the most important finding. You may also have tests to rule out other causes of chest pain. These may include:   A chest X-ray to check for lung problems.   An electrocardiogram (ECG) to see if you have a heart problem that could be causing the pain.   An imaging scan to rule out a chest or rib fracture.  How is this treated?  This condition usually goes away on its own over time. Your health care provider may prescribe an NSAID to reduce pain and inflammation. Your health care provider may also suggest that you:   Rest and avoid activities that make pain worse.   Apply heat or cold to the area to reduce pain and  inflammation.   Do exercises to stretch your chest muscles.  If these treatments do not help, your health care provider may inject a numbing medicine at the sternum-rib connection to help relieve the pain.  Follow these instructions at home:   Avoid activities that make pain worse. This includes any activities that use chest, abdominal, and side muscles.   If directed, put ice on the painful area:  ? Put ice in a plastic bag.  ? Place a towel between your skin and the bag.  ? Leave the ice on for 20 minutes, 2-3 times a day.   If directed, apply heat to the affected area as often as told by your health care provider. Use the heat source that your health care provider recommends, such as a moist heat pack or a heating pad.  ? Place a towel between your skin and the heat source.  ? Leave the heat on for 20-30 minutes.  ? Remove the heat if your skin turns bright red. This is especially important if you are unable to feel pain, heat, or cold. You may have a greater risk of getting burned.   Take over-the-counter and prescription medicines only as told by your health care provider.   Return to your normal activities as told by your health care provider. Ask your health care provider what   activities are safe for you.   Keep all follow-up visits as told by your health care provider. This is important.  Contact a health care provider if:   You have chills or a fever.   Your pain does not go away or it gets worse.   You have a cough that does not go away (is persistent).  Get help right away if:   You have shortness of breath.  This information is not intended to replace advice given to you by your health care provider. Make sure you discuss any questions you have with your health care provider.  Document Released: 01/12/2005 Document Revised: 01/04/2017 Document Reviewed: 07/29/2015  Elsevier Interactive Patient Education  2019 Elsevier Inc.

## 2018-11-12 ENCOUNTER — Other Ambulatory Visit: Payer: Self-pay

## 2018-11-12 DIAGNOSIS — I10 Essential (primary) hypertension: Secondary | ICD-10-CM

## 2018-11-12 MED ORDER — LISINOPRIL 30 MG PO TABS: 30.0000 mg | ORAL_TABLET | Freq: Every day | ORAL | 0 refills | Status: DC

## 2018-11-12 MED FILL — LISINOPRIL 30 MG TABLET: 30 | 90 days supply | Qty: 90 | Fill #0

## 2018-11-28 ENCOUNTER — Encounter: Payer: Self-pay | Admitting: Family Medicine

## 2018-11-28 ENCOUNTER — Other Ambulatory Visit: Payer: Self-pay

## 2018-11-28 ENCOUNTER — Ambulatory Visit (INDEPENDENT_AMBULATORY_CARE_PROVIDER_SITE_OTHER): Payer: 59 | Admitting: Family Medicine

## 2018-11-28 VITALS — BP 139/87 | HR 91 | Temp 97.7°F | Ht 62.0 in | Wt 185.0 lb

## 2018-11-28 DIAGNOSIS — L02211 Cutaneous abscess of abdominal wall: Secondary | ICD-10-CM

## 2018-11-28 MED ORDER — CLINDAMYCIN HCL 300 MG PO CAPS: 300.0000 mg | ORAL_CAPSULE | Freq: Four times a day (QID) | ORAL | 0 refills | Status: AC

## 2018-11-28 NOTE — Progress Notes (Signed)
Subjective: CC: abscess PCP: Tracy Sleeper, PA-C [name] is a 53 y.o. female presenting to clinic today for:  1. Abscess She has a boil on the lower right belly that has been ongoing for several days.  She has been on Doxycycline since Monday.  The boil is getting better but the redness and swelling are not.   It burst and started draining today.  Has severe allergy/ anaphylaxis to PCN and had a rash to Rocephin.  Does not report fever but does report diarrhea since starting Doxycycline and pain at boil site.   ROS: Per HPI  Allergies  Allergen Reactions  . Penicillins Anaphylaxis  . Sulfa Antibiotics Rash   Past Medical History:  Diagnosis Date  . Anxiety   . Diabetes mellitus without complication ([place])   . Elevated heart rate and blood pressure   . Esophageal reflux   . Migraines     Current Outpatient Medications:  .  ALPRAZolam (XANAX) 0.5 MG tablet, Take 1 tablet (0.5 mg total) by mouth 2 (two) times daily as needed for anxiety., Disp: 60 tablet, Rfl: 0 .  azelastine (ASTELIN) 0.1 % nasal spray, Place 2 sprays into both nostrils 2 (two) times daily. Use in each nostril as directed, Disp: 30 mL, Rfl: 1 .  cyclobenzaprine (FLEXERIL) 10 MG tablet, Take 1 tablet (10 mg total) by mouth 3 (three) times daily as needed for muscle spasms., Disp: 30 tablet, Rfl: 1 .  fluticasone (FLONASE) 50 MCG/ACT nasal spray, Place 2 sprays into both nostrils daily., Disp: 48 g, Rfl: 3 .  lisinopril (ZESTRIL) 30 MG tablet, Take 1 tablet (30 mg total) by mouth daily., Disp: 90 tablet, Rfl: 0 .  loratadine (CLARITIN) 10 MG tablet, Take 1 tablet (10 mg total) by mouth daily., Disp: 90 tablet, Rfl: 3 .  metFORMIN (GLUCOPHAGE) 500 MG tablet, Take 1 tablet (500 mg total) by mouth 2 (two) times daily with a meal., Disp: 180 tablet, Rfl: 1 .  omeprazole (PRILOSEC) 20 MG capsule, Take 1 capsule (20 mg total) by mouth 2 (two) times daily before a meal., Disp: 180 capsule, Rfl: 3 .   sertraline (ZOLOFT) 100 MG tablet, Take 2 tablets (200 mg total) by mouth daily., Disp: 180 tablet, Rfl: 3 .  valACYclovir (VALTREX) 1000 MG tablet, Take 1 tablet (1,000 mg total) by mouth 2 (two) times daily. (Patient not taking: Reported on 09/17/2018), Disp: 20 tablet, Rfl: 0 Social History   Socioeconomic History  . Marital status: Widowed    Spouse name: Not on file  . Number of children: Not on file  . Years of education: Not on file  . Highest education level: Not on file  Occupational History  . Not on file  Social Needs  . Financial resource strain: Not on file  . Food insecurity    Worry: Not on file    Inability: Not on file  . Transportation needs    Medical: Not on file    Non-medical: Not on file  Tobacco Use  . Smoking status: Current Every Day Smoker  . Smokeless tobacco: Never Used  Substance and Sexual Activity  . Alcohol use: Yes    Alcohol/week: 2.0 standard drinks    Types: 2 Standard drinks or equivalent per week  . Drug use: No  . Sexual activity: Yes  Lifestyle  . Physical activity    Days per week: Not on file    Minutes per session: Not on file  . Stress: Not on  file  Relationships  . Social Herbalist on phone: Not on file    Gets together: Not on file    Attends religious service: Not on file    Active member of club or organization: Not on file    Attends meetings of clubs or organizations: Not on file    Relationship status: Not on file  . Intimate partner violence    Fear of current or ex partner: Not on file    Emotionally abused: Not on file    Physically abused: Not on file    Forced sexual activity: Not on file  Other Topics Concern  . Not on file  Social History Narrative  . Not on file   Family History  Problem Relation Age of Onset  . Breast cancer Mother   . Cancer Mother        breast  . Hypertension Mother   . Diabetes Father   . Diabetes Paternal Uncle   . Hypertension Daughter   . Hyperlipidemia Daughter      Objective: Office vital signs reviewed. BP 139/87   Pulse 91   Temp 97.7 F (36.5 C)   Ht 5\' 2"  (1.575 m)   Wt 185 lb (83.9 kg)   BMI 33.84 kg/m   Physical Examination:  General: Awake, alert, nontoxic, No acute distress Skin: football area of erythema along the right lower aspect of the abdomen.  She has a grape sized area of induration centrally with a central draining punctum.  Purulent drainage appreciated.  Lesion is mobile and TTP.  It appears to involve the skin/ subq only.  There is increased warmth  Assessment/ Plan: 53 y.o. female   1. Cutaneous abscess of abdominal wall Appears confined to skin.  Nothing to suggest tunneling or deeper infection.  Discontinue doxy.  Start clindamycin.  Probiotic for diarrhea.  C Diff risks reviewed.  Keep area clean and covered.  If no significant improvement in 48 hours, recommend reevaluation. - clindamycin (CLEOCIN) 300 MG capsule; Take 1 capsule (300 mg total) by mouth 4 (four) times daily for 10 days.  Dispense: 40 capsule; Refill: 0   No orders of the defined types were placed in this encounter.  No orders of the defined types were placed in this encounter.    Tracy Norlander, DO [place] [phone]

## 2018-11-28 NOTE — Patient Instructions (Signed)
Skin Abscess  A skin abscess is an infected area on or under your skin that contains a collection of pus and other material. An abscess may also be called a furuncle, carbuncle, or boil. An abscess can occur in or on almost any part of your body. Some abscesses break open (rupture) on their own. Most continue to get worse unless they are treated. The infection can spread deeper into the body and eventually into your blood, which can make you feel ill. Treatment usually involves draining the abscess. What are the causes? An abscess occurs when germs, like bacteria, pass through your skin and cause an infection. This may be caused by:  A scrape or cut on your skin.  A puncture wound through your skin, including a needle injection or insect bite.  Blocked oil or sweat glands.  Blocked and infected hair follicles.  A cyst that forms beneath your skin (sebaceous cyst) and becomes infected. What increases the risk? This condition is more likely to develop in people who:  Have a weak body defense system (immune system).  Have diabetes.  Have dry and irritated skin.  Get frequent injections or use illegal IV drugs.  Have a foreign body in a wound, such as a splinter.  Have problems with their lymph system or veins. What are the signs or symptoms? Symptoms of this condition include:  A painful, firm bump under the skin.  A bump with pus at the top. This may break through the skin and drain. Other symptoms include:  Redness surrounding the abscess site.  Warmth.  Swelling of the lymph nodes (glands) near the abscess.  Tenderness.  A sore on the skin. How is this diagnosed? This condition may be diagnosed based on:  A physical exam.  Your medical history.  A sample of pus. This may be used to find out what is causing the infection.  Blood tests.  Imaging tests, such as an ultrasound, CT scan, or MRI. How is this treated? A small abscess that drains on its own may not  need treatment. Treatment for larger abscesses may include:  Moist heat or heat pack applied to the area several times a day.  A procedure to drain the abscess (incision and drainage).  Antibiotic medicines. For a severe abscess, you may first get antibiotics through an IV and then change to antibiotics by mouth. Follow these instructions at home: Medicines   Take over-the-counter and prescription medicines only as told by your health care provider.  If you were prescribed an antibiotic medicine, take it as told by your health care provider. Do not stop taking the antibiotic even if you start to feel better. Abscess care   If you have an abscess that has not drained, apply heat to the affected area. Use the heat source that your health care provider recommends, such as a moist heat pack or a heating pad. ? Place a towel between your skin and the heat source. ? Leave the heat on for 20-30 minutes. ? Remove the heat if your skin turns bright red. This is especially important if you are unable to feel pain, heat, or cold. You may have a greater risk of getting burned.  Follow instructions from your health care provider about how to take care of your abscess. Make sure you: ? Cover the abscess with a bandage (dressing). ? Change your dressing or gauze as told by your health care provider. ? Wash your hands with soap and water before you change the   dressing or gauze. If soap and water are not available, use hand sanitizer.  Check your abscess every day for signs of a worsening infection. Check for: ? More redness, swelling, or pain. ? More fluid or blood. ? Warmth. ? More pus or a bad smell. General instructions  To avoid spreading the infection: ? Do not share personal care items, towels, or hot tubs with others. ? Avoid making skin contact with other people.  Keep all follow-up visits as told by your health care provider. This is important. Contact a health care provider if you  have:  More redness, swelling, or pain around your abscess.  More fluid or blood coming from your abscess.  Warm skin around your abscess.  More pus or a bad smell coming from your abscess.  A fever.  Muscle aches.  Chills or a general ill feeling. Get help right away if you:  Have severe pain.  See red streaks on your skin spreading away from the abscess. Summary  A skin abscess is an infected area on or under your skin that contains a collection of pus and other material.  A small abscess that drains on its own may not need treatment.  Treatment for larger abscesses may include having a procedure to drain the abscess and taking an antibiotic. This information is not intended to replace advice given to you by your health care provider. Make sure you discuss any questions you have with your health care provider. Document Released: 01/12/2005 Document Revised: 07/26/2018 Document Reviewed: 05/18/2017 Elsevier Patient Education  2020 Elsevier Inc.  

## 2019-01-03 ENCOUNTER — Other Ambulatory Visit: Payer: 59

## 2019-01-03 ENCOUNTER — Other Ambulatory Visit: Payer: Self-pay

## 2019-01-03 ENCOUNTER — Other Ambulatory Visit: Payer: Self-pay | Admitting: *Deleted

## 2019-01-03 DIAGNOSIS — E119 Type 2 diabetes mellitus without complications: Secondary | ICD-10-CM

## 2019-01-03 LAB — BAYER DCA HB A1C WAIVED: HB A1C (BAYER DCA - WAIVED): 5.7 % (ref <7.0)

## 2019-01-03 MED ORDER — METFORMIN HCL 500 MG PO TABS: 500.0000 mg | ORAL_TABLET | Freq: Two times a day (BID) | ORAL | 1 refills | Status: DC

## 2019-01-03 MED FILL — metFORMIN HCL 500 MG TABS: 500 | 90 days supply | Qty: 180 | Fill #0

## 2019-01-28 ENCOUNTER — Other Ambulatory Visit: Payer: Self-pay | Admitting: Physician Assistant

## 2019-01-28 ENCOUNTER — Other Ambulatory Visit: Payer: Self-pay

## 2019-01-28 DIAGNOSIS — F3342 Major depressive disorder, recurrent, in full remission: Secondary | ICD-10-CM

## 2019-01-28 MED ORDER — SERTRALINE HCL 100 MG PO TABS: 150.0000 mg | ORAL_TABLET | Freq: Every day | ORAL | 1 refills | Status: DC

## 2019-01-28 MED ORDER — FLUCONAZOLE 150 MG PO TABS: ORAL_TABLET | ORAL | 0 refills | Status: DC

## 2019-01-28 MED ORDER — NYSTATIN 100000 UNIT/GM EX CREA: 1.0000 "application " | TOPICAL_CREAM | Freq: Two times a day (BID) | CUTANEOUS | 2 refills | Status: DC

## 2019-01-28 MED FILL — SERTRALINE HCL 100 MG TAB: 100 | 90 days supply | Qty: 135 | Fill #0

## 2019-03-01 ENCOUNTER — Other Ambulatory Visit: Payer: Self-pay

## 2019-03-01 DIAGNOSIS — I10 Essential (primary) hypertension: Secondary | ICD-10-CM

## 2019-03-01 MED ORDER — LISINOPRIL 30 MG PO TABS: 30.0000 mg | ORAL_TABLET | Freq: Every day | ORAL | 0 refills | Status: DC

## 2019-03-01 MED ORDER — OMEPRAZOLE 20 MG PO CPDR: 20.0000 mg | DELAYED_RELEASE_CAPSULE | Freq: Two times a day (BID) | ORAL | 3 refills | Status: DC

## 2019-03-01 MED FILL — OMEPRAZOLE 20 MG CAP: 20 | 90 days supply | Qty: 180 | Fill #0

## 2019-03-01 MED FILL — LISINOPRIL 30 MG TABLET: 30 | 90 days supply | Qty: 90 | Fill #0

## 2019-03-01 NOTE — Telephone Encounter (Signed)
Patient needs a refill of her Omeprazole and Lisinopril send to [facility].

## 2019-03-27 ENCOUNTER — Other Ambulatory Visit: Payer: Self-pay | Admitting: Family Medicine

## 2019-03-27 DIAGNOSIS — L0291 Cutaneous abscess, unspecified: Secondary | ICD-10-CM

## 2019-03-27 MED ORDER — DOXYCYCLINE HYCLATE 100 MG PO TABS: 100.0000 mg | ORAL_TABLET | Freq: Two times a day (BID) | ORAL | 0 refills | Status: DC

## 2019-04-01 ENCOUNTER — Ambulatory Visit (INDEPENDENT_AMBULATORY_CARE_PROVIDER_SITE_OTHER): Payer: 59 | Admitting: Family Medicine

## 2019-04-01 ENCOUNTER — Other Ambulatory Visit: Payer: Self-pay

## 2019-04-01 ENCOUNTER — Encounter: Payer: Self-pay | Admitting: Family Medicine

## 2019-04-01 ENCOUNTER — Other Ambulatory Visit: Payer: Self-pay | Admitting: Family Medicine

## 2019-04-01 VITALS — BP 131/78 | HR 78 | Temp 98.6°F | Resp 20 | Ht 62.0 in | Wt 188.0 lb

## 2019-04-01 DIAGNOSIS — N611 Abscess of the breast and nipple: Secondary | ICD-10-CM | POA: Diagnosis not present

## 2019-04-01 MED ORDER — CEPHALEXIN 500 MG PO CAPS: 500.0000 mg | ORAL_CAPSULE | Freq: Three times a day (TID) | ORAL | 0 refills | Status: AC

## 2019-04-01 NOTE — Progress Notes (Signed)
Subjective:  Patient ID: [name], female    DOB: Jul 05, 1965, 53 y.o.   MRN: [id number]  Patient Care Team: Theodoro Clock as PCP - General (Physician Assistant)   Chief Complaint:  right breast lesion   HPI: [name] is a 53 y.o. female presenting on 04/01/2019 for right breast lesion   Pt presents today with an open draining wound to her right breast. She started on Doxycycline on Friday for the abscess. States the lesion opened and started draining yesterday. States the breast is very tender to touch. She has been cleaning and dressing the wound several times per day. She has been taking antibiotics as prescribed. No fever, chills, weakness, confusion, or fatigue.      Relevant past medical, surgical, family, and social history reviewed and updated as indicated.  Allergies and medications reviewed and updated. Date reviewed: Chart in Epic.   Past Medical History:  Diagnosis Date  . Anxiety   . Diabetes mellitus without complication ([place])   . Elevated heart rate and blood pressure   . Esophageal reflux   . Migraines     Past Surgical History:  Procedure Laterality Date  . LAPAROSCOPIC VAGINAL HYSTERECTOMY N/A 05 10 2012    Social History   Socioeconomic History  . Marital status: Widowed    Spouse name: Not on file  . Number of children: Not on file  . Years of education: Not on file  . Highest education level: Not on file  Occupational History  . Not on file  Tobacco Use  . Smoking status: Current Every Day Smoker  . Smokeless tobacco: Never Used  Substance and Sexual Activity  . Alcohol use: Yes    Alcohol/week: 2.0 standard drinks    Types: 2 Standard drinks or equivalent per week  . Drug use: No  . Sexual activity: Yes  Other Topics Concern  . Not on file  Social History Narrative  . Not on file   Social Determinants of Health   Financial Resource Strain:   . Difficulty of Paying Living Expenses: Not on file  Food  Insecurity:   . Worried About Charity fundraiser in the Last Year: Not on file  . Ran Out of Food in the Last Year: Not on file  Transportation Needs:   . Lack of Transportation (Medical): Not on file  . Lack of Transportation (Non-Medical): Not on file  Physical Activity:   . Days of Exercise per Week: Not on file  . Minutes of Exercise per Session: Not on file  Stress:   . Feeling of Stress : Not on file  Social Connections:   . Frequency of Communication with Friends and Family: Not on file  . Frequency of Social Gatherings with Friends and Family: Not on file  . Attends Religious Services: Not on file  . Active Member of Clubs or Organizations: Not on file  . Attends Archivist Meetings: Not on file  . Marital Status: Not on file  Intimate Partner Violence:   . Fear of Current or Ex-Partner: Not on file  . Emotionally Abused: Not on file  . Physically Abused: Not on file  . Sexually Abused: Not on file    Outpatient Encounter Medications as of 04/01/2019  Medication Sig  . ALPRAZolam (XANAX) 0.5 MG tablet Take 1 tablet (0.5 mg total) by mouth 2 (two) times daily as needed for anxiety.  . cephALEXin (KEFLEX) 500 MG capsule Take 1 capsule (  500 mg total) by mouth 3 (three) times daily for 7 days.  Marland Kitchen doxycycline (VIBRA-TABS) 100 MG tablet Take 1 tablet (100 mg total) by mouth 2 (two) times daily for 10 days. 1 po bid  . fluconazole (DIFLUCAN) 150 MG tablet 1 po q week x 4 weeks  . fluticasone (FLONASE) 50 MCG/ACT nasal spray Place 2 sprays into both nostrils daily.  Marland Kitchen lisinopril (ZESTRIL) 30 MG tablet Take 1 tablet (30 mg total) by mouth daily.  Marland Kitchen loratadine (CLARITIN) 10 MG tablet Take 1 tablet (10 mg total) by mouth daily.  . metFORMIN (GLUCOPHAGE) 500 MG tablet Take 1 tablet (500 mg total) by mouth 2 (two) times daily with a meal.  . nystatin cream (MYCOSTATIN) Apply 1 application topically 2 (two) times daily.  Marland Kitchen omeprazole (PRILOSEC) 20 MG capsule Take 1 capsule  (20 mg total) by mouth 2 (two) times daily before a meal.  . sertraline (ZOLOFT) 100 MG tablet Take 1.5 tablets (150 mg total) by mouth daily.  . valACYclovir (VALTREX) 1000 MG tablet Take 1 tablet (1,000 mg total) by mouth 2 (two) times daily.   No facility-administered encounter medications on file as of 04/01/2019.    Allergies  Allergen Reactions  . Penicillins Anaphylaxis  . Sulfa Antibiotics Rash    Review of Systems  Constitutional: Negative for activity change, appetite change, chills, diaphoresis, fatigue, fever and unexpected weight change.  HENT: Negative.   Eyes: Negative.   Respiratory: Negative for cough, chest tightness and shortness of breath.   Cardiovascular: Negative for chest pain, palpitations and leg swelling.  Gastrointestinal: Negative for blood in stool, constipation, diarrhea, nausea and vomiting.  Endocrine: Negative.   Genitourinary: Negative for decreased urine volume, difficulty urinating, dysuria, frequency and urgency.  Musculoskeletal: Negative for arthralgias and myalgias.  Skin: Positive for color change and wound.  Allergic/Immunologic: Negative.   Neurological: Negative for dizziness, weakness and headaches.  Hematological: Negative.   Psychiatric/Behavioral: Negative for confusion, hallucinations, sleep disturbance and suicidal ideas.  All other systems reviewed and are negative.       Objective:  BP 131/78   Pulse 78   Temp 98.6 F (37 C)   Resp 20   Ht 5\' 2"  (1.575 m)   Wt 188 lb (85.3 kg)   SpO2 98%   BMI 34.39 kg/m    Wt Readings from Last 3 Encounters:  04/01/19 188 lb (85.3 kg)  11/28/18 185 lb (83.9 kg)  09/17/18 185 lb (83.9 kg)    Physical Exam Vitals and nursing note reviewed.  Constitutional:      General: She is in acute distress (mild).     Appearance: Normal appearance. She is not ill-appearing, toxic-appearing or diaphoretic.  HENT:     Head: Normocephalic and atraumatic.     Mouth/Throat:     Mouth:  Mucous membranes are moist.  Eyes:     Conjunctiva/sclera: Conjunctivae normal.     Pupils: Pupils are equal, round, and reactive to light.  Cardiovascular:     Rate and Rhythm: Normal rate.  Pulmonary:     Effort: Pulmonary effort is normal. No respiratory distress.  Chest:    Skin:    General: Skin is warm and dry.     Capillary Refill: Capillary refill takes less than 2 seconds.     Findings: Abscess (right breast) present.  Neurological:     General: No focal deficit present.     Mental Status: She is alert and oriented to person, place, and time.  Results for orders placed or performed in visit on 01/03/19  Bayer DCA Hb A1c Waived  Result Value Ref Range   HB A1C (BAYER DCA - WAIVED) 5.7 <7.0 %       Pertinent labs & imaging results that were available during my care of the patient were reviewed by me and considered in my medical decision making.  Assessment & Plan:  Rav[place]one was seen today for right breast lesion.  Diagnoses and all orders for this visit:  Abscess of right breast Pt currently on doxycycline due to allergy to sulfa drugs and PCN. Will add Keflex today. Wound culture obtained today. Symptomatic care discussed in detail. If symptoms worsen or fail to improve, will refer to breast surgeon for evaluation and treatment.     Continue all other maintenance medications.  Follow up plan: Return in about 1 week (around 04/08/2019).  Continue healthy lifestyle choices, including diet (rich in fruits, vegetables, and lean proteins, and low in salt and simple carbohydrates) and exercise (at least 30 minutes of moderate physical activity daily).  Educational handout given for abscess  The above assessment and management plan was discussed with the patient. The patient verbalized understanding of and has agreed to the management plan. Patient is aware to call the clinic if they develop any new symptoms or if symptoms persist or worsen. Patient is aware when to  return to the clinic for a follow-up visit. Patient educated on when it is appropriate to go to the emergency department.   Monia Pouch, FNP-C [facility] [phone]

## 2019-04-01 NOTE — Patient Instructions (Signed)
Skin Abscess  A skin abscess is an infected area on or under your skin that contains a collection of pus and other material. An abscess may also be called a furuncle, carbuncle, or boil. An abscess can occur in or on almost any part of your body. Some abscesses break open (rupture) on their own. Most continue to get worse unless they are treated. The infection can spread deeper into the body and eventually into your blood, which can make you feel ill. Treatment usually involves draining the abscess. What are the causes? An abscess occurs when germs, like bacteria, pass through your skin and cause an infection. This may be caused by:  A scrape or cut on your skin.  A puncture wound through your skin, including a needle injection or insect bite.  Blocked oil or sweat glands.  Blocked and infected hair follicles.  A cyst that forms beneath your skin (sebaceous cyst) and becomes infected. What increases the risk? This condition is more likely to develop in people who:  Have a weak body defense system (immune system).  Have diabetes.  Have dry and irritated skin.  Get frequent injections or use illegal IV drugs.  Have a foreign body in a wound, such as a splinter.  Have problems with their lymph system or veins. What are the signs or symptoms? Symptoms of this condition include:  A painful, firm bump under the skin.  A bump with pus at the top. This may break through the skin and drain. Other symptoms include:  Redness surrounding the abscess site.  Warmth.  Swelling of the lymph nodes (glands) near the abscess.  Tenderness.  A sore on the skin. How is this diagnosed? This condition may be diagnosed based on:  A physical exam.  Your medical history.  A sample of pus. This may be used to find out what is causing the infection.  Blood tests.  Imaging tests, such as an ultrasound, CT scan, or MRI. How is this treated? A small abscess that drains on its own may not  need treatment. Treatment for larger abscesses may include:  Moist heat or heat pack applied to the area several times a day.  A procedure to drain the abscess (incision and drainage).  Antibiotic medicines. For a severe abscess, you may first get antibiotics through an IV and then change to antibiotics by mouth. Follow these instructions at home: Medicines   Take over-the-counter and prescription medicines only as told by your health care provider.  If you were prescribed an antibiotic medicine, take it as told by your health care provider. Do not stop taking the antibiotic even if you start to feel better. Abscess care   If you have an abscess that has not drained, apply heat to the affected area. Use the heat source that your health care provider recommends, such as a moist heat pack or a heating pad. ? Place a towel between your skin and the heat source. ? Leave the heat on for 20-30 minutes. ? Remove the heat if your skin turns bright red. This is especially important if you are unable to feel pain, heat, or cold. You may have a greater risk of getting burned.  Follow instructions from your health care provider about how to take care of your abscess. Make sure you: ? Cover the abscess with a bandage (dressing). ? Change your dressing or gauze as told by your health care provider. ? Wash your hands with soap and water before you change the   dressing or gauze. If soap and water are not available, use hand sanitizer.  Check your abscess every day for signs of a worsening infection. Check for: ? More redness, swelling, or pain. ? More fluid or blood. ? Warmth. ? More pus or a bad smell. General instructions  To avoid spreading the infection: ? Do not share personal care items, towels, or hot tubs with others. ? Avoid making skin contact with other people.  Keep all follow-up visits as told by your health care provider. This is important. Contact a health care provider if you  have:  More redness, swelling, or pain around your abscess.  More fluid or blood coming from your abscess.  Warm skin around your abscess.  More pus or a bad smell coming from your abscess.  A fever.  Muscle aches.  Chills or a general ill feeling. Get help right away if you:  Have severe pain.  See red streaks on your skin spreading away from the abscess. Summary  A skin abscess is an infected area on or under your skin that contains a collection of pus and other material.  A small abscess that drains on its own may not need treatment.  Treatment for larger abscesses may include having a procedure to drain the abscess and taking an antibiotic. This information is not intended to replace advice given to you by your health care provider. Make sure you discuss any questions you have with your health care provider. Document Released: 01/12/2005 Document Revised: 07/26/2018 Document Reviewed: 05/18/2017 Elsevier Patient Education  2020 Elsevier Inc.  

## 2019-04-05 LAB — ANAEROBIC AND AEROBIC CULTURE

## 2019-04-05 MED ORDER — CLINDAMYCIN HCL 300 MG PO CAPS: 300.0000 mg | ORAL_CAPSULE | Freq: Three times a day (TID) | ORAL | 0 refills | Status: AC

## 2019-04-05 NOTE — Addendum Note (Signed)
Addended by: Baruch Gouty on: 04/05/2019 01:25 PM   Modules accepted: Orders

## 2019-05-17 ENCOUNTER — Ambulatory Visit (INDEPENDENT_AMBULATORY_CARE_PROVIDER_SITE_OTHER): Payer: 59 | Admitting: Nurse Practitioner

## 2019-05-17 ENCOUNTER — Other Ambulatory Visit: Payer: Self-pay

## 2019-05-17 ENCOUNTER — Ambulatory Visit (INDEPENDENT_AMBULATORY_CARE_PROVIDER_SITE_OTHER): Payer: 59

## 2019-05-17 ENCOUNTER — Encounter: Payer: Self-pay | Admitting: Nurse Practitioner

## 2019-05-17 VITALS — BP 128/89 | HR 88 | Temp 98.4°F | Resp 20 | Ht 62.0 in | Wt 188.0 lb

## 2019-05-17 DIAGNOSIS — R1013 Epigastric pain: Secondary | ICD-10-CM

## 2019-05-17 DIAGNOSIS — R109 Unspecified abdominal pain: Secondary | ICD-10-CM | POA: Diagnosis not present

## 2019-05-17 MED FILL — LORATADINE 10 MG TABS: 10 | 90 days supply | Qty: 90 | Fill #1

## 2019-05-17 MED FILL — metFORMIN HCL 500 MG TABS: 500 | 90 days supply | Qty: 180 | Fill #1

## 2019-05-17 MED FILL — SERTRALINE HCL 100 MG TAB: 100 | 90 days supply | Qty: 135 | Fill #1

## 2019-05-17 NOTE — Progress Notes (Signed)
   Subjective:    Patient ID: [name], female    DOB: 1965-11-24, 54 y.o.   MRN: [id number]   Chief Complaint: Abdominal Pain   HPI Patient comes in c/o of feeling a knot upper abdomen. She thinks she has a hiatal hernia. She denies any symptoms of reflux, but she does take omeprazole daily.  When she presses on area it feels sore.    Review of Systems  Constitutional: Negative.  Negative for diaphoresis.  Eyes: Negative for pain.  Respiratory: Negative for shortness of breath.   Cardiovascular: Negative for chest pain, palpitations and leg swelling.  Gastrointestinal: Negative for abdominal pain, constipation, diarrhea, nausea and vomiting.  Endocrine: Negative for polydipsia.  Skin: Negative for rash.  Neurological: Negative for dizziness, weakness and headaches.  Hematological: Does not bruise/bleed easily.  All other systems reviewed and are negative.      Objective:   Physical Exam Vitals and nursing note reviewed.  Constitutional:      Appearance: She is well-developed.  Cardiovascular:     Rate and Rhythm: Normal rate and regular rhythm.  Pulmonary:     Breath sounds: Normal breath sounds.  Abdominal:     General: Bowel sounds are normal.     Tenderness: There is abdominal tenderness in the suprapubic area.       Comments: tnederness with palpable 3 cm nodule mid upper epigastric area  Skin:    General: Skin is warm and dry.  Neurological:     Mental Status: She is alert.    BP 128/89   Pulse 88   Temp 98.4 F (36.9 C) (Temporal)   Resp 20   Ht 5\' 2"  (1.575 m)   Wt 188 lb (85.3 kg)   SpO2 94%   BMI 34.39 kg/m    KUB normal     Assessment & Plan:  Hassan Rowan Kipp in today with chief complaint of Abdominal Pain   1. Epigastric pain Will talk once [place] is complete - DG Abd 1 View; Future - US Abdomen Complete; Future    The above assessment and management plan was discussed with the patient. The patient verbalized understanding of  and has agreed to the management plan. Patient is aware to call the clinic if symptoms persist or worsen. Patient is aware when to return to the clinic for a follow-up visit. Patient educated on when it is appropriate to go to the emergency department.   [name], FNP

## 2019-06-24 ENCOUNTER — Ambulatory Visit (INDEPENDENT_AMBULATORY_CARE_PROVIDER_SITE_OTHER): Payer: 59 | Admitting: Family Medicine

## 2019-06-24 ENCOUNTER — Ambulatory Visit (INDEPENDENT_AMBULATORY_CARE_PROVIDER_SITE_OTHER): Payer: 59

## 2019-06-24 ENCOUNTER — Encounter: Payer: Self-pay | Admitting: Family Medicine

## 2019-06-24 ENCOUNTER — Other Ambulatory Visit: Payer: Self-pay

## 2019-06-24 VITALS — BP 128/79 | HR 78 | Ht 62.0 in | Wt 185.0 lb

## 2019-06-24 DIAGNOSIS — R0789 Other chest pain: Secondary | ICD-10-CM | POA: Diagnosis not present

## 2019-06-24 DIAGNOSIS — R0602 Shortness of breath: Secondary | ICD-10-CM

## 2019-06-24 DIAGNOSIS — R011 Cardiac murmur, unspecified: Secondary | ICD-10-CM | POA: Diagnosis not present

## 2019-06-24 DIAGNOSIS — Z8249 Family history of ischemic heart disease and other diseases of the circulatory system: Secondary | ICD-10-CM

## 2019-06-24 DIAGNOSIS — I10 Essential (primary) hypertension: Secondary | ICD-10-CM

## 2019-06-24 DIAGNOSIS — R079 Chest pain, unspecified: Secondary | ICD-10-CM | POA: Diagnosis not present

## 2019-06-24 MED ORDER — LISINOPRIL 30 MG PO TABS: 30.0000 mg | ORAL_TABLET | Freq: Every day | ORAL | 3 refills | Status: DC

## 2019-06-24 MED FILL — LISINOPRIL 30 MG TABLET: 30 | 90 days supply | Qty: 90 | Fill #0

## 2019-06-24 NOTE — Progress Notes (Signed)
Subjective: CC: chest pain PCP: Terald Sleeper, PA-C [name] is a 54 y.o. female presenting to clinic today for:  1. Chest pain Patient reports onset of left sided chest pain after lunch.  She had lasagna for lunch.  She notes that this does not feel like her typical acid reflux and she reports compliance with acid reflux medication.  She took 3 Tums and symptoms are getting slightly better.  She points to the area underneath her left breast as the area of pain.  She is experienced pain similar to this in the past.  She feels that the pain is taking her breath away.  It is not exacerbated by activity but she did feel somewhat sweaty earlier.  Denies any nausea, vomiting, dizziness.  She had previous cardiac evaluation that was unremarkable except for physical deconditioning.  Family history significant for clotting disorder in her mother and brother.  She denies any lower extremity calf swelling but has had some pains in the calf before.  ROS: Per HPI  Allergies  Allergen Reactions  . Penicillins Anaphylaxis  . Sulfa Antibiotics Rash   Past Medical History:  Diagnosis Date  . Anxiety   . Diabetes mellitus without complication ([place])   . Elevated heart rate and blood pressure   . Esophageal reflux   . Migraines     Current Outpatient Medications:  .  fluticasone (FLONASE) 50 MCG/ACT nasal spray, Place 2 sprays into both nostrils daily., Disp: 48 g, Rfl: 3 .  lisinopril (ZESTRIL) 30 MG tablet, Take 1 tablet (30 mg total) by mouth daily., Disp: 90 tablet, Rfl: 0 .  loratadine (CLARITIN) 10 MG tablet, Take 1 tablet (10 mg total) by mouth daily., Disp: 90 tablet, Rfl: 3 .  metFORMIN (GLUCOPHAGE) 500 MG tablet, Take 1 tablet (500 mg total) by mouth 2 (two) times daily with a meal., Disp: 180 tablet, Rfl: 1 .  omeprazole (PRILOSEC) 20 MG capsule, Take 1 capsule (20 mg total) by mouth 2 (two) times daily before a meal., Disp: 180 capsule, Rfl: 3 .  sertraline (ZOLOFT) 100 MG  tablet, Take 1.5 tablets (150 mg total) by mouth daily., Disp: 135 tablet, Rfl: 1 .  ALPRAZolam (XANAX) 0.5 MG tablet, Take 1 tablet (0.5 mg total) by mouth 2 (two) times daily as needed for anxiety. (Patient not taking: Reported on 06/24/2019), Disp: 60 tablet, Rfl: 0 .  valACYclovir (VALTREX) 1000 MG tablet, Take 1 tablet (1,000 mg total) by mouth 2 (two) times daily. (Patient not taking: Reported on 06/24/2019), Disp: 20 tablet, Rfl: 0 Social History   Socioeconomic History  . Marital status: Widowed    Spouse name: Not on file  . Number of children: Not on file  . Years of education: Not on file  . Highest education level: Not on file  Occupational History  . Not on file  Tobacco Use  . Smoking status: Current Every Day Smoker  . Smokeless tobacco: Never Used  Substance and Sexual Activity  . Alcohol use: Yes    Alcohol/week: 2.0 standard drinks    Types: 2 Standard drinks or equivalent per week  . Drug use: No  . Sexual activity: Yes  Other Topics Concern  . Not on file  Social History Narrative  . Not on file   Social Determinants of Health   Financial Resource Strain:   . Difficulty of Paying Living Expenses: Not on file  Food Insecurity:   . Worried About Charity fundraiser in the Last  Year: Not on file  . Ran Out of Food in the Last Year: Not on file  Transportation Needs:   . Lack of Transportation (Medical): Not on file  . Lack of Transportation (Non-Medical): Not on file  Physical Activity:   . Days of Exercise per Week: Not on file  . Minutes of Exercise per Session: Not on file  Stress:   . Feeling of Stress : Not on file  Social Connections:   . Frequency of Communication with Friends and Family: Not on file  . Frequency of Social Gatherings with Friends and Family: Not on file  . Attends Religious Services: Not on file  . Active Member of Clubs or Organizations: Not on file  . Attends Archivist Meetings: Not on file  . Marital Status: Not on  file  Intimate Partner Violence:   . Fear of Current or Ex-Partner: Not on file  . Emotionally Abused: Not on file  . Physically Abused: Not on file  . Sexually Abused: Not on file   Family History  Problem Relation Age of Onset  . Breast cancer Mother   . Cancer Mother        breast  . Hypertension Mother   . Diabetes Father   . Diabetes Paternal Uncle   . Hypertension Daughter   . Hyperlipidemia Daughter     Objective: Office vital signs reviewed. BP 128/79   Pulse 78   Ht 5' 2"  (1.575 m)   Wt 185 lb (83.9 kg)   SpO2 97%   BMI 33.84 kg/m   Physical Examination:  General: Awake, alert, obese, No acute distress Cardio: regular rate and rhythm, S1S2 heard, 2/6 SEM noted at right sternal border. No radiation to carotids. Pulm: clear to auscultation bilaterally, no wheezes, rhonchi or rales; normal work of breathing on room air Chest: reproducible TTP to left anterior lower ribs. Extremities: warm, well perfused, No edema, cyanosis or clubbing; +2 pulses bilaterally MSK: normal gait and station  DG Chest 2 View  Result Date: 06/24/2019 CLINICAL DATA:  Chest pain and shortness of breath EXAM: CHEST - 2 VIEW COMPARISON:  September 17, 2018 FINDINGS: The lungs are clear. Heart size and pulmonary vascularity are normal. No adenopathy. No pneumothorax. There old healed rib fractures on the right. There is postoperative change in the lower cervical region. IMPRESSION: Lungs clear.  Stable cardiac silhouette. Electronically Signed   By: Lowella Grip III M.D.   On: 06/24/2019 15:16    Assessment/ Plan: 54 y.o. female   1. Atypical chest pain I personally reviewed her EKG which did not demonstrate any ischemic changes or arrhythmia.  I obtain a chest x-ray which again I reviewed personally and demonstrated no acute findings.  I go ahead and collected a D-dimer given intermittent calf pain and family history of clotting disorder.  I have also ordered a troponin for completion.  We  discussed the possibility of NSTEMI despite thus far normal findings.  I suspect this may be GI mediated versus musculoskeletal in nature.  However, continue work up and refer to cardiology for repeat stress testing. - EKG 12-Lead - D-dimer, quantitative (not at [facility]) - CMP14+EGFR - CBC - TSH - Troponin I - Ambulatory referral to Cardiology  2. SOB (shortness of breath) More of difficulty taking a deep breath in.  No big change in exercise tolerance.  She is physically deconditioned.  No acute findings on lung exam and chest x-ray was negative for infection or other pathology - DG  Chest 2 View; Future - EKG 12-Lead - D-dimer, quantitative (not at [facility]) - Troponin I - Ambulatory referral to Cardiology  3. Family history of DVT - D-dimer, quantitative (not at [facility])  4. Heart murmur Noted on exam.  I reviewed her 2013 echocardiogram which did demonstrate mild aortic regurgitation.  I suspect that this is what we are hearing today - Ambulatory referral to Cardiology  5. Essential hypertension Controlled.  Continue current regimen.  Renewal sent.  Check BMP as above - lisinopril (ZESTRIL) 30 MG tablet; Take 1 tablet (30 mg total) by mouth daily.  Dispense: 90 tablet; Refill: 3   Orders Placed This Encounter  Procedures  . DG Chest 2 View    Standing Status:   Future    Number of Occurrences:   1    Standing Expiration Date:   08/23/2020    Order Specific Question:   Reason for Exam (SYMPTOM  OR DIAGNOSIS REQUIRED)    Answer:   sob and chest pain    Order Specific Question:   Is the patient pregnant?    Answer:   No    Order Specific Question:   Preferred imaging location?    Answer:   Internal  . EKG 12-Lead   No orders of the defined types were placed in this encounter.    [name], DO [place] [phone]

## 2019-06-25 LAB — CMP14+EGFR
ALT: 36 IU/L — ABNORMAL HIGH (ref 0–32)
AST: 28 IU/L (ref 0–40)
Albumin/Globulin Ratio: 2 (ref 1.2–2.2)
Albumin: 4.8 g/dL (ref 3.8–4.9)
Alkaline Phosphatase: 75 IU/L (ref 39–117)
BUN/Creatinine Ratio: 18 (ref 9–23)
BUN: 11 mg/dL (ref 6–24)
Bilirubin Total: 0.3 mg/dL (ref 0.0–1.2)
CO2: 25 mmol/L (ref 20–29)
Calcium: 10.1 mg/dL (ref 8.7–10.2)
Chloride: 99 mmol/L (ref 96–106)
Creatinine, Ser: 0.61 mg/dL (ref 0.57–1.00)
GFR calc Af Amer: 121 mL/min/{1.73_m2} (ref >59)
GFR calc non Af Amer: 105 mL/min/{1.73_m2} (ref >59)
Globulin, Total: 2.4 g/dL (ref 1.5–4.5)
Glucose: 94 mg/dL (ref 65–99)
Potassium: 4 mmol/L (ref 3.5–5.2)
Sodium: 139 mmol/L (ref 134–144)
Total Protein: 7.2 g/dL (ref 6.0–8.5)

## 2019-06-25 LAB — CBC
Hematocrit: 43.2 % (ref 34.0–46.6)
Hemoglobin: 15.1 g/dL (ref 11.1–15.9)
MCH: 31.9 pg (ref 26.6–33.0)
MCHC: 35 g/dL (ref 31.5–35.7)
MCV: 91 fL (ref 79–97)
Platelets: 379 10*3/uL (ref 150–450)
RBC: 4.73 x10E6/uL (ref 3.77–5.28)
RDW: 11.7 % (ref 11.7–15.4)
WBC: 11.1 10*3/uL — ABNORMAL HIGH (ref 3.4–10.8)

## 2019-06-25 LAB — TSH: TSH: 4.36 u[IU]/mL (ref 0.450–4.500)

## 2019-06-25 LAB — D-DIMER, QUANTITATIVE: D-DIMER: 0.27 mg/L FEU (ref 0.00–0.49)

## 2019-06-25 LAB — TROPONIN I: Troponin I: <0.01 ng/mL (ref 0.00–0.04)

## 2019-07-05 ENCOUNTER — Encounter: Payer: Self-pay | Admitting: Family Medicine

## 2019-07-06 DIAGNOSIS — Z03818 Encounter for observation for suspected exposure to other biological agents ruled out: Secondary | ICD-10-CM | POA: Diagnosis not present

## 2019-07-06 DIAGNOSIS — Z20828 Contact with and (suspected) exposure to other viral communicable diseases: Secondary | ICD-10-CM | POA: Diagnosis not present

## 2019-07-29 ENCOUNTER — Other Ambulatory Visit: Payer: Self-pay | Admitting: Family Medicine

## 2019-07-29 DIAGNOSIS — R1906 Epigastric swelling, mass or lump: Secondary | ICD-10-CM

## 2019-07-30 ENCOUNTER — Ambulatory Visit: Payer: 59 | Admitting: Family Medicine

## 2019-07-30 ENCOUNTER — Other Ambulatory Visit: Payer: Self-pay

## 2019-07-30 ENCOUNTER — Encounter: Payer: Self-pay | Admitting: Family Medicine

## 2019-07-30 ENCOUNTER — Ambulatory Visit (HOSPITAL_COMMUNITY): Admission: RE | Admit: 2019-07-30 | Payer: 59 | Source: Ambulatory Visit

## 2019-07-30 VITALS — BP 116/78 | HR 83 | Temp 97.8°F | Ht 62.0 in | Wt 185.0 lb

## 2019-07-30 DIAGNOSIS — F32A Depression, unspecified: Secondary | ICD-10-CM

## 2019-07-30 DIAGNOSIS — F329 Major depressive disorder, single episode, unspecified: Secondary | ICD-10-CM | POA: Diagnosis not present

## 2019-07-30 DIAGNOSIS — R0789 Other chest pain: Secondary | ICD-10-CM

## 2019-07-30 MED ORDER — PREDNISONE 10 MG PO TABS: ORAL_TABLET | ORAL | 0 refills | Status: DC

## 2019-07-30 MED ORDER — METHYLPREDNISOLONE ACETATE 40 MG/ML IJ SUSP: 40.0000 mg | Freq: Once | INTRAMUSCULAR | Status: DC

## 2019-07-30 MED ORDER — FLUOXETINE HCL 10 MG PO TABS: 30.0000 mg | ORAL_TABLET | Freq: Every day | ORAL | 0 refills | Status: DC

## 2019-07-30 NOTE — Progress Notes (Signed)
Subjective: CC: sternal/ abdominal pain, depression PCP: [name], DO [name] is a 54 y.o. female presenting to clinic today for:  1. Sternal pain Patient reports several month history of sternal pain.  She points to the xiphoid process as the area of pain.  In March she had eval for ACS and this was negative.  She has been using Motrin but this is not very helpful.  She does admit to wearing a bra that tends to worsen the pain.  She has not tried switching out bras to see if this might improve.  She was worried about a lump or mass.  She thinks she may have a hiatal hernia as well was not sure if this was also contributing to this issue.  Sometimes the pain is so severe that it takes her breath away.  She had work-up for possible PE and again this was negative.  2.  Depressive disorder Patient reports many year history of depressive disorder.  She has been on Zoloft for over 21 years now.  Initially started with 50 mg which was not effective.  She is currently taking 150 mg daily.  She did try adding Wellbutrin before but this was not helpful.  It seemed to exacerbate symptoms.  She was also switched over to Viibryd at one point but she started having withdrawal symptoms with transition from Zoloft to [place].  She reports a low motivation, anhedonia, isolated behaviors.  No SI or HI.   ROS: Per HPI  Allergies  Allergen Reactions  . Penicillins Anaphylaxis  . Sulfa Antibiotics Rash   Past Medical History:  Diagnosis Date  . Anxiety   . Diabetes mellitus without complication ([place])   . Elevated heart rate and blood pressure   . Esophageal reflux   . Migraines     Current Outpatient Medications:  .  ALPRAZolam (XANAX) 0.5 MG tablet, Take 1 tablet (0.5 mg total) by mouth 2 (two) times daily as needed for anxiety., Disp: 60 tablet, Rfl: 0 .  fluticasone (FLONASE) 50 MCG/ACT nasal spray, Place 2 sprays into both nostrils daily., Disp: 48 g, Rfl: 3 .  lisinopril  (ZESTRIL) 30 MG tablet, Take 1 tablet (30 mg total) by mouth daily., Disp: 90 tablet, Rfl: 3 .  loratadine (CLARITIN) 10 MG tablet, Take 1 tablet (10 mg total) by mouth daily., Disp: 90 tablet, Rfl: 3 .  metFORMIN (GLUCOPHAGE) 500 MG tablet, Take 1 tablet (500 mg total) by mouth 2 (two) times daily with a meal., Disp: 180 tablet, Rfl: 1 .  omeprazole (PRILOSEC) 20 MG capsule, Take 1 capsule (20 mg total) by mouth 2 (two) times daily before a meal., Disp: 180 capsule, Rfl: 3 .  sertraline (ZOLOFT) 100 MG tablet, Take 1.5 tablets (150 mg total) by mouth daily., Disp: 135 tablet, Rfl: 1 .  valACYclovir (VALTREX) 1000 MG tablet, Take 1 tablet (1,000 mg total) by mouth 2 (two) times daily., Disp: 20 tablet, Rfl: 0 Social History   Socioeconomic History  . Marital status: Widowed    Spouse name: Not on file  . Number of children: Not on file  . Years of education: Not on file  . Highest education level: Not on file  Occupational History  . Not on file  Tobacco Use  . Smoking status: Current Every Day Smoker  . Smokeless tobacco: Never Used  Substance and Sexual Activity  . Alcohol use: Yes    Alcohol/week: 2.0 standard drinks    Types: 2 Standard drinks  or equivalent per week  . Drug use: No  . Sexual activity: Yes  Other Topics Concern  . Not on file  Social History Narrative  . Not on file   Social Determinants of Health   Financial Resource Strain:   . Difficulty of Paying Living Expenses:   Food Insecurity:   . Worried About Charity fundraiser in the Last Year:   . Arboriculturist in the Last Year:   Transportation Needs:   . Film/video editor (Medical):   Marland Kitchen Lack of Transportation (Non-Medical):   Physical Activity:   . Days of Exercise per Week:   . Minutes of Exercise per Session:   Stress:   . Feeling of Stress :   Social Connections:   . Frequency of Communication with Friends and Family:   . Frequency of Social Gatherings with Friends and Family:   . Attends  Religious Services:   . Active Member of Clubs or Organizations:   . Attends Archivist Meetings:   Marland Kitchen Marital Status:   Intimate Partner Violence:   . Fear of Current or Ex-Partner:   . Emotionally Abused:   Marland Kitchen Physically Abused:   . Sexually Abused:    Family History  Problem Relation Age of Onset  . Breast cancer Mother   . Cancer Mother        breast  . Hypertension Mother   . Diabetes Father   . Diabetes Paternal Uncle   . Hypertension Daughter   . Hyperlipidemia Daughter     Objective: Office vital signs reviewed. BP 116/78   Pulse 83   Temp 97.8 F (36.6 C)   Ht 5\' 2"  (1.575 m)   Wt 185 lb (83.9 kg)   SpO2 98%   BMI 33.84 kg/m   Physical Examination:  General: Awake, alert, well nourished, No acute distress HEENT: Normal, sclera white, MMM Chest: Point tenderness at the xiphoid process which does appear to be slightly larger than I would expect.  There are no palpable abdominal masses.  She had normal work of breathing on room air. Psych: Mood stable, speech normal, affect appropriate, pleasant Depression screen [facility] 2/9 07/30/2019 06/24/2019 05/17/2019  Decreased Interest 1 1 0  Down, Depressed, Hopeless 0 0 0  PHQ - 2 Score 1 1 0  Altered sleeping 1 0 -  Tired, decreased energy 2 2 -  Change in appetite 1 0 -  Feeling bad or failure about yourself  0 1 -  Trouble concentrating 2 1 -  Moving slowly or fidgety/restless 0 0 -  Suicidal thoughts 0 0 -  PHQ-9 Score 7 5 -  Difficult doing work/chores Not difficult at all Not difficult at all -  No flowsheet data found.  Assessment/ Plan: 54 y.o. female   1. Xiphoid pain I suspect that this is more inflammation of the xiphoid.  I have recommended that she discontinue use of current bra and try using a sports bra instead.  I am also going to go ahead and put her on a course of prednisone to see if we can reduce inflammation.  Ultrasound was already ordered so we will further evaluate the process with  ultrasound.  If symptoms persist and imaging is normal, plan for referral to orthopedics for consideration of direct corticosteroid injection. - methylPREDNISolone acetate (DEPO-MEDROL) injection 40 mg  2. Depressive disorder We will have her stop the Zoloft.  We did discuss consideration for increasing the dose to 200 mg but she  would like to try something different.  I have consulted with the pharmacist and we will try equivalent dosing of 30 to 40 mg daily.  Start with 30 mg.  If she starts having any unusual withdrawal-like symptoms can increase to 40 mg daily.  I will follow up with her in 1 month, sooner if needed.  [facility] testing also discussed - FLUoxetine (PROZAC) 10 MG tablet; Take 3 tablets (30 mg total) by mouth daily. STOP zoloft  Dispense: 90 tablet; Refill: 0   No orders of the defined types were placed in this encounter.  No orders of the defined types were placed in this encounter.    [name], DO [place] [phone]

## 2019-08-01 ENCOUNTER — Ambulatory Visit (HOSPITAL_COMMUNITY): Admission: RE | Admit: 2019-08-01 | Payer: 59 | Source: Ambulatory Visit

## 2019-08-11 DIAGNOSIS — Z20828 Contact with and (suspected) exposure to other viral communicable diseases: Secondary | ICD-10-CM | POA: Diagnosis not present

## 2019-08-11 DIAGNOSIS — Z03818 Encounter for observation for suspected exposure to other biological agents ruled out: Secondary | ICD-10-CM | POA: Diagnosis not present

## 2019-10-02 ENCOUNTER — Other Ambulatory Visit: Payer: Self-pay | Admitting: Family Medicine

## 2019-10-02 ENCOUNTER — Other Ambulatory Visit: Payer: Self-pay

## 2019-10-02 DIAGNOSIS — I10 Essential (primary) hypertension: Secondary | ICD-10-CM

## 2019-10-02 MED ORDER — OMEPRAZOLE 20 MG PO CPDR: 20.0000 mg | DELAYED_RELEASE_CAPSULE | Freq: Two times a day (BID) | ORAL | 3 refills | Status: DC

## 2019-10-02 MED ORDER — SERTRALINE HCL 100 MG PO TABS: 150.0000 mg | ORAL_TABLET | Freq: Every day | ORAL | 3 refills | Status: DC

## 2019-10-02 MED ORDER — LISINOPRIL 30 MG PO TABS: 30.0000 mg | ORAL_TABLET | Freq: Every day | ORAL | 3 refills | Status: DC

## 2019-10-02 MED ORDER — METFORMIN HCL 500 MG PO TABS: 500.0000 mg | ORAL_TABLET | Freq: Two times a day (BID) | ORAL | 3 refills | Status: DC

## 2019-10-02 MED FILL — METFORMIN HCL 500 MG TABS: 500 | 90 days supply | Qty: 180 | Fill #0

## 2019-10-02 MED FILL — LISINOPRIL 30 MG TABLET: 30 | 90 days supply | Qty: 90 | Fill #0

## 2019-10-02 MED FILL — OMEPRAZOLE 20 MG CAP: 20 | 90 days supply | Qty: 180 | Fill #0

## 2019-10-02 MED FILL — SERTRALINE HCL 100 MG TABS: 100 | 90 days supply | Qty: 135 | Fill #0

## 2019-11-27 ENCOUNTER — Other Ambulatory Visit: Payer: Self-pay | Admitting: Family Medicine

## 2019-11-27 MED ORDER — KETOCONAZOLE 2 % EX CREA: 1.0000 "application " | TOPICAL_CREAM | Freq: Every day | CUTANEOUS | 0 refills | Status: DC

## 2019-11-28 ENCOUNTER — Other Ambulatory Visit: Payer: Self-pay | Admitting: Family

## 2019-11-28 MED ORDER — NYSTATIN 100000 UNIT/GM EX POWD
1.0000 | Freq: Three times a day (TID) | CUTANEOUS | 0 refills | Status: DC
Start: 2019-11-28 — End: 2020-09-25

## 2019-12-12 ENCOUNTER — Ambulatory Visit: Payer: Self-pay | Attending: Internal Medicine

## 2019-12-12 DIAGNOSIS — Z23 Encounter for immunization: Secondary | ICD-10-CM

## 2019-12-12 NOTE — Progress Notes (Signed)
   Covid-19 Vaccination Clinic  Name:  [name]    MRN: [id number] DOB: Sep 13, 1965  12/12/2019  [name] was observed post Covid-19 immunization for 15 minutes without incident. She was provided with Vaccine Information Sheet and instruction to access the V-Safe system.   [name] was instructed to call 911 with any severe reactions post vaccine: Marland Kitchen Difficulty breathing  . Swelling of face and throat  . A fast heartbeat  . A bad rash all over body  . Dizziness and weakness   Immunizations Administered    Name Date Dose VIS Date Route   Pfizer COVID-19 Vaccine 12/12/2019  3:43 PM 0.3 mL 06/12/2018 Intramuscular   Manufacturer: [place]   Lot: [id number]   [place]: [id number]

## 2020-01-02 ENCOUNTER — Ambulatory Visit: Payer: 59

## 2020-01-17 ENCOUNTER — Other Ambulatory Visit: Payer: Self-pay

## 2020-01-17 ENCOUNTER — Ambulatory Visit (INDEPENDENT_AMBULATORY_CARE_PROVIDER_SITE_OTHER): Payer: 59

## 2020-01-17 DIAGNOSIS — Z23 Encounter for immunization: Secondary | ICD-10-CM

## 2020-01-17 NOTE — Progress Notes (Signed)
   Covid-19 Vaccination Clinic  Name:  [name]    MRN: [id number] DOB: [birth year]  01/17/2020  [name] was observed post Covid-19 immunization for 15 minutes without incident. She was provided with Vaccine Information Sheet and instruction to access the V-Safe system.   [name] was instructed to call 911 with any severe reactions post vaccine: Marland Kitchen Difficulty breathing  . Swelling of face and throat  . A fast heartbeat  . A bad rash all over body  . Dizziness and weakness   Immunizations Administered    Name Date Dose VIS Date Route   Pfizer COVID-19 Vaccine 01/17/2020 11:43 AM 0.3 mL 06/12/2018 Intramuscular   Manufacturer: [place]   Lot: [id number]   [place]: [id number]

## 2020-01-20 ENCOUNTER — Other Ambulatory Visit: Payer: Self-pay | Admitting: Family Medicine

## 2020-01-20 MED ORDER — FLUTICASONE PROPIONATE 50 MCG/ACT NA SUSP: 2.0000 | Freq: Every day | NASAL | 3 refills | Status: DC

## 2020-01-20 MED FILL — FLUTICASONE PROP 50 MCG SPR: 50 | 90 days supply | Qty: 48 | Fill #0

## 2020-01-22 ENCOUNTER — Other Ambulatory Visit: Payer: Self-pay | Admitting: Family Medicine

## 2020-01-22 MED ORDER — DOXYCYCLINE HYCLATE 100 MG PO TABS
100.0000 mg | ORAL_TABLET | Freq: Two times a day (BID) | ORAL | 0 refills | Status: DC
Start: 2020-01-22 — End: 2020-09-25

## 2020-01-22 MED ORDER — FLUCONAZOLE 150 MG PO TABS
150.0000 mg | ORAL_TABLET | Freq: Once | ORAL | 0 refills | Status: AC
Start: 2020-01-22 — End: 2020-01-22

## 2020-01-22 MED FILL — METFORMIN HCL 500 MG TABS: 500 | 90 days supply | Qty: 180 | Fill #1

## 2020-01-22 MED FILL — OMEPRAZOLE 20 MG CAP: 20 | 90 days supply | Qty: 180 | Fill #1

## 2020-01-22 MED FILL — LISINOPRIL 30 MG TABLET: 30 | 90 days supply | Qty: 90 | Fill #1

## 2020-01-22 MED FILL — SERTRALINE HCL 100 MG TABS: 100 | 90 days supply | Qty: 135 | Fill #1

## 2020-02-04 ENCOUNTER — Other Ambulatory Visit: Payer: Self-pay | Admitting: *Deleted

## 2020-02-04 MED ORDER — VALACYCLOVIR HCL 1 G PO TABS: 1000.0000 mg | ORAL_TABLET | Freq: Two times a day (BID) | ORAL | 0 refills | Status: DC

## 2020-04-16 DIAGNOSIS — H5203 Hypermetropia, bilateral: Secondary | ICD-10-CM | POA: Diagnosis not present

## 2020-04-23 ENCOUNTER — Other Ambulatory Visit: Payer: Self-pay

## 2020-04-23 ENCOUNTER — Other Ambulatory Visit: Payer: 59

## 2020-04-23 DIAGNOSIS — Z20822 Contact with and (suspected) exposure to covid-19: Secondary | ICD-10-CM | POA: Diagnosis not present

## 2020-04-25 LAB — SARS-COV-2, NAA 2 DAY TAT

## 2020-04-25 LAB — NOVEL CORONAVIRUS, NAA: SARS-CoV-2, NAA: NOT DETECTED

## 2020-05-01 MED FILL — METFORMIN HCL 500 MG TABS: 500 | 90 days supply | Qty: 180 | Fill #2

## 2020-05-01 MED FILL — LISINOPRIL 30 MG TABLET: 30 | 90 days supply | Qty: 90 | Fill #2

## 2020-05-01 MED FILL — SERTRALINE HCL 100 MG TABS: 100 | 90 days supply | Qty: 135 | Fill #2

## 2020-05-28 ENCOUNTER — Other Ambulatory Visit: Payer: Self-pay | Admitting: Nurse Practitioner

## 2020-05-28 MED ORDER — TOBRAMYCIN-DEXAMETHASONE 0.3-0.1 % OP SUSP: 2.0000 [drp] | OPHTHALMIC | 0 refills | Status: DC

## 2020-05-29 ENCOUNTER — Telehealth: Payer: Self-pay | Admitting: Nurse Practitioner

## 2020-05-29 NOTE — Telephone Encounter (Signed)
Patient came in to office on Thursday c/o left eye swollen and irritated. She denied an injury. Doe snot recall getting anything in eye.  tobradex gtts were sent to pharmacy  [name], [place]

## 2020-07-09 ENCOUNTER — Other Ambulatory Visit (HOSPITAL_BASED_OUTPATIENT_CLINIC_OR_DEPARTMENT_OTHER): Payer: Self-pay

## 2020-07-30 ENCOUNTER — Other Ambulatory Visit: Payer: Self-pay | Admitting: Family Medicine

## 2020-07-30 DIAGNOSIS — I10 Essential (primary) hypertension: Secondary | ICD-10-CM

## 2020-07-30 MED ORDER — LISINOPRIL 30 MG PO TABS
ORAL_TABLET | Freq: Every day | ORAL | 0 refills | Status: DC
Start: 2020-07-30 — End: 2020-08-25

## 2020-08-24 ENCOUNTER — Other Ambulatory Visit: Payer: Self-pay

## 2020-08-24 DIAGNOSIS — E559 Vitamin D deficiency, unspecified: Secondary | ICD-10-CM

## 2020-08-24 DIAGNOSIS — E119 Type 2 diabetes mellitus without complications: Secondary | ICD-10-CM

## 2020-08-24 DIAGNOSIS — R002 Palpitations: Secondary | ICD-10-CM

## 2020-08-25 ENCOUNTER — Other Ambulatory Visit: Payer: Self-pay | Admitting: Family Medicine

## 2020-08-25 ENCOUNTER — Other Ambulatory Visit (HOSPITAL_COMMUNITY): Payer: Self-pay

## 2020-08-25 DIAGNOSIS — I10 Essential (primary) hypertension: Secondary | ICD-10-CM

## 2020-08-25 MED ORDER — LISINOPRIL 30 MG PO TABS
ORAL_TABLET | Freq: Every day | ORAL | 0 refills | Status: DC
  Filled 2020-08-25: qty 90, 90d supply, fill #0

## 2020-08-25 MED FILL — Sertraline HCl Tab 100 MG: ORAL | 90 days supply | Qty: 135 | Fill #0 | Status: AC

## 2020-09-02 ENCOUNTER — Other Ambulatory Visit: Payer: 59

## 2020-09-02 DIAGNOSIS — E559 Vitamin D deficiency, unspecified: Secondary | ICD-10-CM | POA: Diagnosis not present

## 2020-09-02 DIAGNOSIS — R002 Palpitations: Secondary | ICD-10-CM

## 2020-09-02 DIAGNOSIS — E119 Type 2 diabetes mellitus without complications: Secondary | ICD-10-CM

## 2020-09-02 LAB — BAYER DCA HB A1C WAIVED: HB A1C (BAYER DCA - WAIVED): 6.4 % (ref <7.0)

## 2020-09-03 LAB — CMP14+EGFR
ALT: 29 IU/L (ref 0–32)
AST: 31 IU/L (ref 0–40)
Albumin/Globulin Ratio: 1.7 (ref 1.2–2.2)
Albumin: 4.5 g/dL (ref 3.8–4.9)
Alkaline Phosphatase: 93 IU/L (ref 44–121)
BUN/Creatinine Ratio: 20 (ref 9–23)
BUN: 13 mg/dL (ref 6–24)
Bilirubin Total: 0.4 mg/dL (ref 0.0–1.2)
CO2: 19 mmol/L — ABNORMAL LOW (ref 20–29)
Calcium: 9.9 mg/dL (ref 8.7–10.2)
Chloride: 101 mmol/L (ref 96–106)
Creatinine, Ser: 0.66 mg/dL (ref 0.57–1.00)
Globulin, Total: 2.6 g/dL (ref 1.5–4.5)
Glucose: 138 mg/dL — ABNORMAL HIGH (ref 65–99)
Potassium: 4.4 mmol/L (ref 3.5–5.2)
Sodium: 139 mmol/L (ref 134–144)
Total Protein: 7.1 g/dL (ref 6.0–8.5)
eGFR: 105 mL/min/{1.73_m2} (ref >59)

## 2020-09-03 LAB — LIPID PANEL
Chol/HDL Ratio: 5.1 ratio — ABNORMAL HIGH (ref 0.0–4.4)
Cholesterol, Total: 245 mg/dL — ABNORMAL HIGH (ref 100–199)
HDL: 48 mg/dL (ref >39)
LDL Chol Calc (NIH): 131 mg/dL — ABNORMAL HIGH (ref 0–99)
Triglycerides: 370 mg/dL — ABNORMAL HIGH (ref 0–149)
VLDL Cholesterol Cal: 66 mg/dL — ABNORMAL HIGH (ref 5–40)

## 2020-09-03 LAB — CBC WITH DIFFERENTIAL/PLATELET
Basophils Absolute: 0.1 10*3/uL (ref 0.0–0.2)
Basos: 1 %
EOS (ABSOLUTE): 0.3 10*3/uL (ref 0.0–0.4)
Eos: 2 %
Hematocrit: 45.7 % (ref 34.0–46.6)
Hemoglobin: 15.6 g/dL (ref 11.1–15.9)
Immature Grans (Abs): 0.1 10*3/uL (ref 0.0–0.1)
Immature Granulocytes: 1 %
Lymphocytes Absolute: 1.6 10*3/uL (ref 0.7–3.1)
Lymphs: 15 %
MCH: 31 pg (ref 26.6–33.0)
MCHC: 34.1 g/dL (ref 31.5–35.7)
MCV: 91 fL (ref 79–97)
Monocytes Absolute: 0.6 10*3/uL (ref 0.1–0.9)
Monocytes: 5 %
Neutrophils Absolute: 8.5 10*3/uL — ABNORMAL HIGH (ref 1.4–7.0)
Neutrophils: 76 %
Platelets: 372 10*3/uL (ref 150–450)
RBC: 5.04 x10E6/uL (ref 3.77–5.28)
RDW: 12.7 % (ref 11.7–15.4)
WBC: 11.1 10*3/uL — ABNORMAL HIGH (ref 3.4–10.8)

## 2020-09-03 LAB — VITAMIN D 25 HYDROXY (VIT D DEFICIENCY, FRACTURES): Vit D, 25-Hydroxy: 21.7 ng/mL — ABNORMAL LOW (ref 30.0–100.0)

## 2020-09-03 LAB — TSH: TSH: 5.16 u[IU]/mL — ABNORMAL HIGH (ref 0.450–4.500)

## 2020-09-04 ENCOUNTER — Other Ambulatory Visit (HOSPITAL_COMMUNITY): Payer: Self-pay

## 2020-09-04 ENCOUNTER — Telehealth: Payer: Self-pay | Admitting: *Deleted

## 2020-09-04 ENCOUNTER — Other Ambulatory Visit: Payer: Self-pay | Admitting: Family Medicine

## 2020-09-04 ENCOUNTER — Other Ambulatory Visit: Payer: Self-pay | Admitting: *Deleted

## 2020-09-04 DIAGNOSIS — E559 Vitamin D deficiency, unspecified: Secondary | ICD-10-CM

## 2020-09-04 DIAGNOSIS — E1169 Type 2 diabetes mellitus with other specified complication: Secondary | ICD-10-CM

## 2020-09-04 DIAGNOSIS — E785 Hyperlipidemia, unspecified: Secondary | ICD-10-CM

## 2020-09-04 DIAGNOSIS — R7989 Other specified abnormal findings of blood chemistry: Secondary | ICD-10-CM

## 2020-09-04 DIAGNOSIS — D72829 Elevated white blood cell count, unspecified: Secondary | ICD-10-CM

## 2020-09-04 MED ORDER — ROSUVASTATIN CALCIUM 5 MG PO TABS: 5.0000 mg | ORAL_TABLET | Freq: Every day | ORAL | 3 refills | Status: DC

## 2020-09-04 MED ORDER — VITAMIN D (ERGOCALCIFEROL) 1.25 MG (50000 UNIT) PO CAPS: 50000.0000 [IU] | ORAL_CAPSULE | ORAL | 0 refills | Status: DC

## 2020-09-04 MED ORDER — ROSUVASTATIN CALCIUM 5 MG PO TABS
5.0000 mg | ORAL_TABLET | Freq: Every day | ORAL | 3 refills | Status: DC
Start: 2020-09-04 — End: 2020-09-25
  Filled 2020-09-04 – 2020-09-07 (×2): qty 90, 90d supply, fill #0

## 2020-09-04 MED ORDER — VITAMIN D (ERGOCALCIFEROL) 1.25 MG (50000 UNIT) PO CAPS
50000.0000 [IU] | ORAL_CAPSULE | ORAL | 0 refills | Status: DC
  Filled 2020-09-04 – 2020-09-07 (×2): qty 8, 56d supply, fill #0

## 2020-09-04 NOTE — Telephone Encounter (Signed)
Pt called - needed her 2 new meds to be sent to [facility] - rather than [facility] changed - pt aware

## 2020-09-04 NOTE — Telephone Encounter (Signed)
Labs reviewed with pt - copy printed for pt

## 2020-09-07 ENCOUNTER — Other Ambulatory Visit (HOSPITAL_COMMUNITY): Payer: Self-pay

## 2020-09-21 ENCOUNTER — Other Ambulatory Visit: Payer: Self-pay | Admitting: Family Medicine

## 2020-09-25 ENCOUNTER — Encounter: Payer: Self-pay | Admitting: Family Medicine

## 2020-09-25 ENCOUNTER — Other Ambulatory Visit: Payer: Self-pay

## 2020-09-25 ENCOUNTER — Other Ambulatory Visit (HOSPITAL_COMMUNITY): Payer: Self-pay

## 2020-09-25 ENCOUNTER — Ambulatory Visit (INDEPENDENT_AMBULATORY_CARE_PROVIDER_SITE_OTHER): Payer: 59 | Admitting: Family Medicine

## 2020-09-25 VITALS — BP 134/91 | HR 95 | Temp 98.0°F | Ht 62.0 in | Wt 186.0 lb

## 2020-09-25 DIAGNOSIS — E669 Obesity, unspecified: Secondary | ICD-10-CM | POA: Diagnosis not present

## 2020-09-25 DIAGNOSIS — E8940 Asymptomatic postprocedural ovarian failure: Secondary | ICD-10-CM | POA: Diagnosis not present

## 2020-09-25 DIAGNOSIS — I152 Hypertension secondary to endocrine disorders: Secondary | ICD-10-CM

## 2020-09-25 DIAGNOSIS — F329 Major depressive disorder, single episode, unspecified: Secondary | ICD-10-CM | POA: Diagnosis not present

## 2020-09-25 DIAGNOSIS — R002 Palpitations: Secondary | ICD-10-CM | POA: Diagnosis not present

## 2020-09-25 DIAGNOSIS — E119 Type 2 diabetes mellitus without complications: Secondary | ICD-10-CM

## 2020-09-25 DIAGNOSIS — Z0001 Encounter for general adult medical examination with abnormal findings: Secondary | ICD-10-CM

## 2020-09-25 DIAGNOSIS — E1159 Type 2 diabetes mellitus with other circulatory complications: Secondary | ICD-10-CM | POA: Diagnosis not present

## 2020-09-25 DIAGNOSIS — E785 Hyperlipidemia, unspecified: Secondary | ICD-10-CM

## 2020-09-25 DIAGNOSIS — E1169 Type 2 diabetes mellitus with other specified complication: Secondary | ICD-10-CM

## 2020-09-25 DIAGNOSIS — Z0000 Encounter for general adult medical examination without abnormal findings: Secondary | ICD-10-CM

## 2020-09-25 DIAGNOSIS — F32A Depression, unspecified: Secondary | ICD-10-CM

## 2020-09-25 DIAGNOSIS — E66811 Obesity, class 1: Secondary | ICD-10-CM

## 2020-09-25 MED ORDER — ROSUVASTATIN CALCIUM 5 MG PO TABS: 5.0000 mg | ORAL_TABLET | ORAL | 3 refills | Status: DC

## 2020-09-25 MED ORDER — SERTRALINE HCL 100 MG PO TABS
ORAL_TABLET | Freq: Every day | ORAL | 3 refills | Status: DC
  Filled 2020-09-25: qty 135, fill #0
  Filled 2020-12-07: qty 135, 90d supply, fill #0
  Filled 2021-03-30: qty 135, 90d supply, fill #1

## 2020-09-25 MED ORDER — PROPRANOLOL HCL 10 MG PO TABS
10.0000 mg | ORAL_TABLET | Freq: Two times a day (BID) | ORAL | 3 refills | Status: DC
Start: 2020-09-25 — End: 2021-03-09
  Filled 2020-09-25: qty 180, 90d supply, fill #0

## 2020-09-25 MED ORDER — OMEPRAZOLE 20 MG PO CPDR
DELAYED_RELEASE_CAPSULE | ORAL | 3 refills | Status: DC
  Filled 2020-09-25: qty 180, 90d supply, fill #0
  Filled 2021-03-30: qty 180, 90d supply, fill #1

## 2020-09-25 MED ORDER — LISINOPRIL 30 MG PO TABS
ORAL_TABLET | Freq: Every day | ORAL | 3 refills | Status: DC
  Filled 2020-09-25 – 2020-12-07 (×2): qty 90, 90d supply, fill #0
  Filled 2021-03-30: qty 90, 90d supply, fill #1

## 2020-09-25 MED ORDER — METFORMIN HCL 500 MG PO TABS
ORAL_TABLET | Freq: Two times a day (BID) | ORAL | 3 refills | Status: DC
  Filled 2020-09-25: qty 180, 90d supply, fill #0

## 2020-09-25 NOTE — Progress Notes (Signed)
[name] is a 55 y.o. female presents to office today for annual physical exam examination.    Concerns today include: 1. Type 2 Diabetes with hypertension, hyperlipidemia:  She is compliant with her metformin, Crestor (which was newly started), lisinopril 30 mg.  She reports heart palpitations but no chest pain, shortness of breath.  No reports of polydipsia or polyuria.  She has eye exam performed January with Dr. Marin Comment.  She was only able to tolerate the Crestor every other day due to mild myalgia noted in the upper extremities.  Last eye exam: Done in January Last foot exam: Needs Last A1c:  Lab Results  Component Value Date   HGBA1C 6.4 09/02/2020   Nephropathy screen indicated?:  On ACE inhibitor Last flu, zoster and/or pneumovax:  Immunization History  Administered Date(s) Administered   Hepatitis B 10/15/1991, 11/12/1991   Influenza Split 01/29/2013   Influenza,inj,Quad PF,6+ Mos 01/20/2014, 01/27/2015, 01/25/2016, 01/18/2017, 01/30/2018, 02/04/2019   PFIZER(Purple Top)SARS-COV-2 Vaccination 12/12/2019, 01/17/2020   Td 08/11/2016   Tdap 05/11/2009    2.  Anxiety disorder Patient suffers from anxiety and depressive disorder.  She is compliant with Zoloft 150 mg daily.  She has Ativan 0.5 mg on hand to use as needed.  She is only used 2 in the last several months and has many leftover.  No refills needed.  Denies excessive daytime sedation with this as compared to Xanax.   Occupation: Marine scientist, Marital status: Widowed, Substance use: Occasional alcohol and tobacco use Diet: No carb restriction, Exercise: No structured Last eye exam: Had in January Last colonoscopy: Wants to hold off on this.  She is not sure that she wants to pursue colon cancer screening Last mammogram: Scheduled for August Last pap smear: N/A.  She had history of hysterectomy Refills needed today: all Immunizations needed: Immunization History  Administered Date(s) Administered   Hepatitis B  10/15/1991, 11/12/1991   Influenza Split 01/29/2013   Influenza,inj,Quad PF,6+ Mos 01/20/2014, 01/27/2015, 01/25/2016, 01/18/2017, 01/30/2018, 02/04/2019   PFIZER(Purple Top)SARS-COV-2 Vaccination 12/12/2019, 01/17/2020   Td 08/11/2016   Tdap 05/11/2009     Past Medical History:  Diagnosis Date   Anxiety    Diabetes mellitus without complication (HCC)    Elevated heart rate and blood pressure    Esophageal reflux    Migraines    Social History   Socioeconomic History   Marital status: Widowed    Spouse name: Not on file   Number of children: Not on file   Years of education: Not on file   Highest education level: Not on file  Occupational History   Not on file  Tobacco Use   Smoking status: Every Day    Pack years: 0.00   Smokeless tobacco: Never  Vaping Use   Vaping Use: Never used  Substance and Sexual Activity   Alcohol use: Yes    Alcohol/week: 2.0 standard drinks    Types: 2 Standard drinks or equivalent per week   Drug use: No   Sexual activity: Yes  Other Topics Concern   Not on file  Social History Narrative   Not on file   Social Determinants of Health   Financial Resource Strain: Not on file  Food Insecurity: Not on file  Transportation Needs: Not on file  Physical Activity: Not on file  Stress: Not on file  Social Connections: Not on file  Intimate Partner Violence: Not on file   Past Surgical History:  Procedure Laterality Date   LAPAROSCOPIC VAGINAL HYSTERECTOMY N/A  05 10 2012   Family History  Problem Relation Age of Onset   Breast cancer Mother    Cancer Mother        breast   Hypertension Mother    Diabetes Father    Diabetes Paternal Uncle    Hypertension Daughter    Hyperlipidemia Daughter     Current Outpatient Medications:    fluticasone (FLONASE) 50 MCG/ACT nasal spray, INSTILL 2 SPRAYS INTO BOTH NOSTRILS DAILY, Disp: 48 g, Rfl: 3   LORazepam (ATIVAN) 0.5 MG tablet, Take 0.5 mg by mouth daily as needed., Disp: , Rfl:     propranolol (INDERAL) 10 MG tablet, Take 1 tablet (10 mg total) by mouth 2 (two) times daily., Disp: 180 tablet, Rfl: 3   rosuvastatin (CRESTOR) 5 MG tablet, Take 1 tablet (5 mg total) by mouth daily., Disp: 90 tablet, Rfl: 3   Vitamin D, Ergocalciferol, (DRISDOL) 1.25 MG (50000 UNIT) CAPS capsule, Take 1 capsule (50,000 Units total) by mouth every 7 (seven) days for 8 doses., Disp: 8 capsule, Rfl: 0   lisinopril (ZESTRIL) 30 MG tablet, TAKE 1 TABLET BY MOUTH ONCE DAILY, Disp: 90 tablet, Rfl: 3   metFORMIN (GLUCOPHAGE) 500 MG tablet, TAKE 1 TABLET BY MOUTH TWICE DAILY WITH A MEAL, Disp: 180 tablet, Rfl: 3   omeprazole (PRILOSEC) 20 MG capsule, TAKE 1 CAPSULE BY MOUTH TWICE DAILY BEFORE A MEAL, Disp: 180 capsule, Rfl: 3   sertraline (ZOLOFT) 100 MG tablet, TAKE 1 AND 1/2 TABLETS BY MOUTH DAILY., Disp: 135 tablet, Rfl: 3  Allergies  Allergen Reactions   Penicillins Anaphylaxis   Sulfa Antibiotics Rash     ROS: Review of Systems Pertinent items noted in HPI and remainder of comprehensive ROS otherwise negative.    Physical exam BP (!) 134/91   Pulse 95   Temp 98 F (36.7 C) (Temporal)   Ht 5\' 2"  (1.575 m)   Wt 186 lb (84.4 kg)   SpO2 97%   BMI 34.02 kg/m  General appearance: alert, cooperative, appears stated age, no distress, and mildly obese Head: Normocephalic, without obvious abnormality, atraumatic Eyes: negative findings: lids and lashes normal, conjunctivae and sclerae normal, corneas clear, and pupils equal, round, reactive to light and accomodation Ears: normal TM's and external ear canals both ears Nose: Nares normal. Septum midline. Mucosa normal. No drainage or sinus tenderness. Throat: lips, mucosa, and tongue normal; teeth and gums normal Neck: no adenopathy, supple, symmetrical, trachea midline, and thyroid not enlarged, symmetric, no tenderness/mass/nodules Back: symmetric, no curvature. ROM normal. No CVA tenderness. Lungs: clear to auscultation bilaterally Heart:  regular rate and rhythm, S1, S2 normal, no murmur, click, rub or gallop Abdomen: soft, non-tender; bowel sounds normal; no masses,  no organomegaly Extremities: extremities normal, atraumatic, no cyanosis or edema Pulses: 2+ and symmetric Skin: Skin color, texture, turgor normal. No rashes or lesions Lymph nodes: Cervical, supraclavicular, and axillary nodes normal. Neurologic: Alert and oriented X 3, normal strength and tone. Normal symmetric reflexes. Normal coordination and gait Psych: Mood stable, speech normal, affect appropriate  Depression screen [facility] 2/9 09/25/2020 07/30/2019 06/24/2019  Decreased Interest 0 1 1  Down, Depressed, Hopeless 0 0 0  PHQ - 2 Score 0 1 1  Altered sleeping 0 1 0  Tired, decreased energy 2 2 2   Change in appetite 0 1 0  Feeling bad or failure about yourself  0 0 1  Trouble concentrating 2 2 1   Moving slowly or fidgety/restless 0 0 0  Suicidal thoughts 0 0  0  PHQ-9 Score 4 7 5   Difficult doing work/chores Not difficult at all Not difficult at all Not difficult at all   GAD 7 : Generalized Anxiety Score 09/25/2020  Nervous, Anxious, on Edge 0  Control/stop worrying 1  Worry too much - different things 1  Trouble relaxing 0  Restless 0  Easily annoyed or irritable 1  Afraid - awful might happen 1  Total GAD 7 Score 4  Anxiety Difficulty Not difficult at all    Assessment/ Plan: [name] here for annual physical exam.   Annual physical exam  Obesity (BMI 30.0-34.9)  Postsurgical menopause - Plan: DG [facility] DEXA  Controlled type 2 diabetes mellitus without complication, without long-term current use of insulin (HCC)  Hyperlipidemia associated with type 2 diabetes mellitus ([place]) - Plan: rosuvastatin (CRESTOR) 5 MG tablet  Hypertension associated with diabetes ([place]) - Plan: lisinopril (ZESTRIL) 30 MG tablet  Heart palpitations - Plan: propranolol (INDERAL) 10 MG tablet  Depressive disorder  Colon cancer screening, DEXA scan given  surgical postmenopausal state >25yrs ago and further evaluation of hematuria recommended today.  Sugar under excellent control.  Foot exam was performed.  ROI for diabetic eye exam collected  Cholesterol was not well controlled but patient recently started on statin.  I have changed her cholesterol medication to reflect current use which was 4 times daily secondary to myalgias  Blood pressure is not controlled from a diastolic standpoint.  I am adding propranolol to help with heart palpitations hopefully she will see a little improvement in blood pressure as well.  Discussed potential side effects of propranolol and she will monitor for these  Depressive disorder is stable.  She has as needed Ativan.  No refills are needed at this time.  Myson Levi M. Lajuana Ripple, DO

## 2020-09-25 NOTE — Patient Instructions (Signed)
Colon cancer screening DEXA  Follow up with urology re: hematuria.  Preventive Care 31-54 Years Old, Female Preventive care refers to lifestyle choices and visits with your health care provider that can promote health and wellness. This includes: A yearly physical exam. This is also called an annual wellness visit. Regular dental and eye exams. Immunizations. Screening for certain conditions. Healthy lifestyle choices, such as: Eating a healthy diet. Getting regular exercise. Not using drugs or products that contain nicotine and tobacco. Limiting alcohol use. What can I expect for my preventive care visit? Physical exam Your health care provider will check your: Height and weight. These may be used to calculate your BMI (body mass index). BMI is a measurement that tells if you are at a healthy weight. Heart rate and blood pressure. Body temperature. Skin for abnormal spots. Counseling Your health care provider may ask you questions about your: Past medical problems. Family's medical history. Alcohol, tobacco, and drug use. Emotional well-being. Home life and relationship well-being. Sexual activity. Diet, exercise, and sleep habits. Work and work Statistician. Access to firearms. Method of birth control. Menstrual cycle. Pregnancy history. What immunizations do I need?  Vaccines are usually given at various ages, according to a schedule. Your health care provider will recommend vaccines for you based on your age, medicalhistory, and lifestyle or other factors, such as travel or where you work. What tests do I need? Blood tests Lipid and cholesterol levels. These may be checked every 5 years, or more often if you are over 48 years old. Hepatitis C test. Hepatitis B test. Screening Lung cancer screening. You may have this screening every year starting at age 71 if you have a 30-pack-year history of smoking and currently smoke or have quit within the past 15  years. Colorectal cancer screening. All adults should have this screening starting at age 74 and continuing until age 43. Your health care provider may recommend screening at age 41 if you are at increased risk. You will have tests every 1-10 years, depending on your results and the type of screening test. Diabetes screening. This is done by checking your blood sugar (glucose) after you have not eaten for a while (fasting). You may have this done every 1-3 years. Mammogram. This may be done every 1-2 years. Talk with your health care provider about when you should start having regular mammograms. This may depend on whether you have a family history of breast cancer. BRCA-related cancer screening. This may be done if you have a family history of breast, ovarian, tubal, or peritoneal cancers. Pelvic exam and Pap test. This may be done every 3 years starting at age 54. Starting at age 74, this may be done every 5 years if you have a Pap test in combination with an HPV test. Other tests STD (sexually transmitted disease) testing, if you are at risk. Bone density scan. This is done to screen for osteoporosis. You may have this scan if you are at high risk for osteoporosis. Talk with your health care provider about your test results, treatment options,and if necessary, the need for more tests. Follow these instructions at home: Eating and drinking  Eat a diet that includes fresh fruits and vegetables, whole grains, lean protein, and low-fat dairy products. Take vitamin and mineral supplements as recommended by your health care provider. Do not drink alcohol if: Your health care provider tells you not to drink. You are pregnant, may be pregnant, or are planning to become pregnant. If you drink alcohol:  Limit how much you have to 0-1 drink a day. Be aware of how much alcohol is in your drink. In the [place] one drink equals one 12 oz bottle of beer (355 mL), one 5 oz glass of wine (148 mL), or one  1 oz glass of hard liquor (44 mL).  Lifestyle Take daily care of your teeth and gums. Brush your teeth every morning and night with fluoride toothpaste. Floss one time each day. Stay active. Exercise for at least 30 minutes 5 or more days each week. Do not use any products that contain nicotine or tobacco, such as cigarettes, e-cigarettes, and chewing tobacco. If you need help quitting, ask your health care provider. Do not use drugs. If you are sexually active, practice safe sex. Use a condom or other form of protection to prevent STIs (sexually transmitted infections). If you do not wish to become pregnant, use a form of birth control. If you plan to become pregnant, see your health care provider for a prepregnancy visit. If told by your health care provider, take low-dose aspirin daily starting at age 75. Find healthy ways to cope with stress, such as: Meditation, yoga, or listening to music. Journaling. Talking to a trusted person. Spending time with friends and family. Safety Always wear your seat belt while driving or riding in a vehicle. Do not drive: If you have been drinking alcohol. Do not ride with someone who has been drinking. When you are tired or distracted. While texting. Wear a helmet and other protective equipment during sports activities. If you have firearms in your house, make sure you follow all gun safety procedures. What's next? Visit your health care provider once a year for an annual wellness visit. Ask your health care provider how often you should have your eyes and teeth checked. Stay up to date on all vaccines. This information is not intended to replace advice given to you by your health care provider. Make sure you discuss any questions you have with your healthcare provider. Document Revised: 01/07/2020 Document Reviewed: 12/14/2017 Elsevier Patient Education  2022 [employer].

## 2020-11-03 ENCOUNTER — Other Ambulatory Visit: Payer: Self-pay | Admitting: Family Medicine

## 2020-11-03 DIAGNOSIS — Z1231 Encounter for screening mammogram for malignant neoplasm of breast: Secondary | ICD-10-CM

## 2020-11-12 ENCOUNTER — Other Ambulatory Visit: Payer: Self-pay

## 2020-11-12 ENCOUNTER — Ambulatory Visit
Admission: RE | Admit: 2020-11-12 | Discharge: 2020-11-12 | Disposition: A | Discharge status: Home / Self Care | Payer: 59 | Source: Ambulatory Visit | Attending: Family Medicine | Admitting: Family Medicine

## 2020-11-12 DIAGNOSIS — Z1231 Encounter for screening mammogram for malignant neoplasm of breast: Secondary | ICD-10-CM | POA: Diagnosis not present

## 2020-11-17 ENCOUNTER — Other Ambulatory Visit: Payer: Self-pay | Admitting: Family Medicine

## 2020-11-17 DIAGNOSIS — R928 Other abnormal and inconclusive findings on diagnostic imaging of breast: Secondary | ICD-10-CM

## 2020-11-25 ENCOUNTER — Telehealth: Payer: Self-pay | Admitting: Pharmacist

## 2020-11-25 MED ORDER — TIRZEPATIDE 2.5 MG/0.5ML ~~LOC~~ SOAJ: 2.5000 mg | SUBCUTANEOUS | 0 refills | Status: DC

## 2020-11-25 NOTE — Telephone Encounter (Signed)
Trial of mounjaro 2.'5mg'$  sq weekly for 1 month Stop metformin Discussed with PCP

## 2020-11-25 NOTE — Addendum Note (Signed)
Addended by: Lottie Dawson D on: 11/25/2020 12:16 PM   Modules accepted: Orders

## 2020-11-25 NOTE — Telephone Encounter (Signed)
Sent to the drug store--he has in stock

## 2020-12-01 ENCOUNTER — Other Ambulatory Visit: Payer: Self-pay

## 2020-12-01 ENCOUNTER — Ambulatory Visit: Payer: 59

## 2020-12-01 ENCOUNTER — Ambulatory Visit
Admission: RE | Admit: 2020-12-01 | Discharge: 2020-12-01 | Disposition: A | Discharge status: Home / Self Care | Payer: 59 | Source: Ambulatory Visit | Attending: Family Medicine | Admitting: Family Medicine

## 2020-12-01 DIAGNOSIS — R922 Inconclusive mammogram: Secondary | ICD-10-CM | POA: Diagnosis not present

## 2020-12-01 DIAGNOSIS — R928 Other abnormal and inconclusive findings on diagnostic imaging of breast: Secondary | ICD-10-CM

## 2020-12-07 ENCOUNTER — Other Ambulatory Visit (HOSPITAL_COMMUNITY): Payer: Self-pay

## 2020-12-17 ENCOUNTER — Ambulatory Visit (INDEPENDENT_AMBULATORY_CARE_PROVIDER_SITE_OTHER): Payer: 59 | Admitting: Pharmacist

## 2020-12-17 ENCOUNTER — Encounter: Payer: Self-pay | Admitting: Pharmacist

## 2020-12-17 ENCOUNTER — Telehealth: Payer: Self-pay | Admitting: Pharmacist

## 2020-12-17 VITALS — Wt 181.0 lb

## 2020-12-17 DIAGNOSIS — E119 Type 2 diabetes mellitus without complications: Secondary | ICD-10-CM

## 2020-12-17 MED ORDER — TIRZEPATIDE 5 MG/0.5ML ~~LOC~~ SOAJ: 5.0000 mg | SUBCUTANEOUS | 2 refills | Status: DC

## 2020-12-17 NOTE — Telephone Encounter (Signed)
PA submitted for t2dm indication copay card covers regardless for $25 pharmacy and patient aware No further action needed

## 2020-12-17 NOTE — Progress Notes (Signed)
    12/17/2020 Name: [name] MRN: [id number] DOB: 1966-03-08   S: 42 yoF Presents for diabetes evaluation, education, and management  Insurance coverage/medication affordability: cone  Patient reports adherence with medications. Current diabetes medications include: mounjaro Current hypertension medications include: lisinopril Goal 130/80 Current hyperlipidemia medications include: crestor   Patient reports hypoglycemic events.   Patient reported dietary habits: Eats 3 meals/day Discussed meal planning options and Plate method for healthy eating Avoid sugary drinks and desserts Incorporate balanced protein, non starchy veggies, 1 serving of carbohydrate with each meal Increase water intake Increase physical activity as able  Patient-reported exercise habits: walking    O:  Lab Results  Component Value Date   HGBA1C 6.4 09/02/2020    Lipid Panel     Component Value Date/Time   CHOL 245 (H) 09/02/2020 0857   TRIG 370 (H) 09/02/2020 0857   HDL 48 09/02/2020 0857   CHOLHDL 5.1 (H) 09/02/2020 0857   LDLCALC 131 (H) 09/02/2020 0857     Home fasting blood sugars: <130  2 hour post-meal/random blood sugars: <180.    Clinical Atherosclerotic Cardiovascular Disease (ASCVD): No   The 10-year ASCVD risk score Mikey Bussing DC Jr., et al., 2013) is: 16.7%   Values used to calculate the score:     Age: 46 years     Sex: Female     Is Non-Hispanic African American: No     Diabetic: Yes     Tobacco smoker: Yes     Systolic Blood Pressure: [id number] mmHg     Is BP treated: Yes     HDL Cholesterol: 48 mg/dL     Total Cholesterol: 245 mg/dL    A/P:  Diabetes t2dm currently controlled--patient doing great on Mounjaro 2.'5mg'$  sq weekly  (denies side effects). She has lost about 5 lbs in 4 weeks.  We will increase to '5mg'$  sq weekly. Denies personal and family history of Medullary thyroid cancer (MTC).  Blood sugar remains controlled.  Metformin has been stopped  -Extensively  discussed pathophysiology of diabetes, recommended lifestyle interventions, dietary effects on blood sugar control  -Counseled on s/sx of and management of hypoglycemia    Written patient instructions provided.  Total time in face to face counseling 20 minutes.    Regina Eck, PharmD, BCPS Clinical Pharmacist, [place]  II Phone [phone]

## 2020-12-18 ENCOUNTER — Telehealth: Payer: Self-pay

## 2020-12-18 NOTE — Telephone Encounter (Signed)
PA approved from [place] for [facility]. Patient notified and pharmacy notified as well.

## 2020-12-18 NOTE — Telephone Encounter (Signed)
PA for Mounjaro '5mg'$ /0.75m approved for 12/17/2020 through 12/16/2021

## 2021-01-18 ENCOUNTER — Other Ambulatory Visit (HOSPITAL_COMMUNITY): Payer: Self-pay

## 2021-01-18 ENCOUNTER — Other Ambulatory Visit: Payer: Self-pay

## 2021-01-18 DIAGNOSIS — J9801 Acute bronchospasm: Secondary | ICD-10-CM

## 2021-01-18 MED ORDER — ALBUTEROL SULFATE HFA 108 (90 BASE) MCG/ACT IN AERS: 2.0000 | INHALATION_SPRAY | Freq: Four times a day (QID) | RESPIRATORY_TRACT | 0 refills | Status: DC | PRN

## 2021-01-18 MED ORDER — ALBUTEROL SULFATE HFA 108 (90 BASE) MCG/ACT IN AERS
2.0000 | INHALATION_SPRAY | Freq: Four times a day (QID) | RESPIRATORY_TRACT | 0 refills | Status: DC | PRN
  Filled 2021-01-18: qty 8, fill #0

## 2021-01-18 MED ORDER — PREDNISONE 20 MG PO TABS: 40.0000 mg | ORAL_TABLET | Freq: Every day | ORAL | 0 refills | Status: AC

## 2021-01-18 NOTE — Progress Notes (Signed)
Albuterol sent to wrong pharmacy.  Corrected and sent to [facility] instead.

## 2021-01-21 ENCOUNTER — Telehealth: Payer: Self-pay | Admitting: Pharmacist

## 2021-01-26 MED ORDER — TIRZEPATIDE 7.5 MG/0.5ML ~~LOC~~ SOAJ: 7.5000 mg | SUBCUTANEOUS | 2 refills | Status: DC

## 2021-01-26 NOTE — Telephone Encounter (Signed)
Increase mounjaro to next dose 7.5 mg sq weekly Patient tolerating well Denies personal and family history of Medullary thyroid cancer (MTC)

## 2021-02-01 ENCOUNTER — Ambulatory Visit (INDEPENDENT_AMBULATORY_CARE_PROVIDER_SITE_OTHER): Payer: 59 | Admitting: Family Medicine

## 2021-02-01 ENCOUNTER — Encounter: Payer: Self-pay | Admitting: Family Medicine

## 2021-02-01 VITALS — BP 113/76 | HR 87 | Temp 97.6°F | Ht 62.0 in | Wt 183.0 lb

## 2021-02-01 DIAGNOSIS — E559 Vitamin D deficiency, unspecified: Secondary | ICD-10-CM

## 2021-02-01 DIAGNOSIS — E119 Type 2 diabetes mellitus without complications: Secondary | ICD-10-CM

## 2021-02-01 DIAGNOSIS — D72829 Elevated white blood cell count, unspecified: Secondary | ICD-10-CM

## 2021-02-01 DIAGNOSIS — R42 Dizziness and giddiness: Secondary | ICD-10-CM

## 2021-02-01 DIAGNOSIS — J0100 Acute maxillary sinusitis, unspecified: Secondary | ICD-10-CM

## 2021-02-01 DIAGNOSIS — R5383 Other fatigue: Secondary | ICD-10-CM | POA: Diagnosis not present

## 2021-02-01 DIAGNOSIS — E785 Hyperlipidemia, unspecified: Secondary | ICD-10-CM

## 2021-02-01 DIAGNOSIS — E1169 Type 2 diabetes mellitus with other specified complication: Secondary | ICD-10-CM

## 2021-02-01 NOTE — Progress Notes (Addendum)
Subjective: CC: Dizziness PCP: Tracy Norlander, DO [name] is a 55 y.o. female presenting to clinic today for:  1.  Sinusitis Patient reports that she has been having ongoing sinusitis, particularly over the maxillary sinuses.  This is despite completion of doxycycline.  She has ongoing sensation of fullness, especially in the left ear.  She has been feeling offkilter.  She at one point thought her eyes might be dilated.  She reports that symptoms were compounded by recent viral illness on Friday but she is taking Imodium and diarrhea has resolved since then.  She feels that she is hydrating adequately.  She is use multiple over-the-counter remedies but nothing has helped.  She reports associated fatigue   ROS: Per HPI  Allergies  Allergen Reactions   Penicillins Anaphylaxis   Sulfa Antibiotics Rash   Past Medical History:  Diagnosis Date   Anxiety    Diabetes mellitus without complication (HCC)    Elevated heart rate and blood pressure    Esophageal reflux    Migraines     Current Outpatient Medications:    albuterol (VENTOLIN HFA) 108 (90 Base) MCG/ACT inhaler, Inhale 2 puffs into the lungs every 6 (six) hours as needed for wheezing or shortness of breath., Disp: 8.5 g, Rfl: 0   lisinopril (ZESTRIL) 30 MG tablet, TAKE 1 TABLET BY MOUTH ONCE DAILY, Disp: 90 tablet, Rfl: 3   LORazepam (ATIVAN) 0.5 MG tablet, Take 0.5 mg by mouth daily as needed., Disp: , Rfl:    omeprazole (PRILOSEC) 20 MG capsule, TAKE 1 CAPSULE BY MOUTH TWICE DAILY BEFORE A MEAL, Disp: 180 capsule, Rfl: 3   propranolol (INDERAL) 10 MG tablet, Take 1 tablet (10 mg total) by mouth 2 (two) times daily., Disp: 180 tablet, Rfl: 3   rosuvastatin (CRESTOR) 5 MG tablet, Take 1 tablet (5 mg total) by mouth every other day., Disp: 90 tablet, Rfl: 3   sertraline (ZOLOFT) 100 MG tablet, TAKE 1 AND 1/2 TABLETS BY MOUTH DAILY., Disp: 135 tablet, Rfl: 3   tirzepatide (MOUNJARO) 7.5 MG/0.5ML Pen, Inject 7.5  mg into the skin once a week., Disp: 2 mL, Rfl: 2   fluticasone (FLONASE) 50 MCG/ACT nasal spray, INSTILL 2 SPRAYS INTO BOTH NOSTRILS DAILY, Disp: 48 g, Rfl: 3 Social History   Socioeconomic History   Marital status: Widowed    Spouse name: Not on file   Number of children: Not on file   Years of education: Not on file   Highest education level: Not on file  Occupational History   Not on file  Tobacco Use   Smoking status: Every Day   Smokeless tobacco: Never  Vaping Use   Vaping Use: Never used  Substance and Sexual Activity   Alcohol use: Yes    Alcohol/week: 2.0 standard drinks    Types: 2 Standard drinks or equivalent per week   Drug use: No   Sexual activity: Yes  Other Topics Concern   Not on file  Social History Narrative   Not on file   Social Determinants of Health   Financial Resource Strain: Not on file  Food Insecurity: Not on file  Transportation Needs: Not on file  Physical Activity: Not on file  Stress: Not on file  Social Connections: Not on file  Intimate Partner Violence: Not on file   Family History  Problem Relation Age of Onset   Breast cancer Mother    Cancer Mother        breast  Hypertension Mother    Diabetes Father    Diabetes Paternal Uncle    Hypertension Daughter    Hyperlipidemia Daughter     Objective: Office vital signs reviewed. BP 113/76   Pulse 87   Temp 97.6 F (36.4 C)   Ht 5' 2" (1.575 m)   Wt 183 lb (83 kg)   SpO2 96%   BMI 33.47 kg/m   Physical Examination:  General: Awake, alert, obese, No acute distress HEENT: Normal    Neck: No masses palpated. No lymphadenopathy    Ears: Tympanic membranes intact, normal light reflex, no erythema, no bulging.  Right external auditory canal with possible hair in it    Eyes: PERRLA, extraocular membranes intact, sclera white    Nose: nasal turbinates moist, clear nasal discharge    Throat: moist mucus membranes  Cardio: regular rate and rhythm, S1S2 heard, no murmurs  appreciated Pulm: clear to auscultation bilaterally, no wheezes, rhonchi or rales; normal work of breathing on room air MSK: normal gait and station Neuro: Cranial nerves grossly intact.  She has negative Romberg  Assessment/ Plan: 55 y.o. female   Subacute maxillary sinusitis - Plan: Ambulatory referral to ENT  Dizziness - Plan: Ambulatory referral to ENT  Fatigue, unspecified type - Plan: TSH, T4, free, Pathologist smear review, Vitamin B12  Controlled type 2 diabetes mellitus without complication, without long-term current use of insulin ([name]) - Plan: Bayer DCA Hb A1c Waived  Hyperlipidemia associated with type 2 diabetes mellitus ([place]) - Plan: CMP14+EGFR, Lipid panel  Leukocytosis, unspecified type - Plan: Pathologist smear review  Vitamin D deficiency - Plan: VITAMIN D 25 Hydroxy (Vit-D Deficiency, Fractures)  Uncertain etiology of symptoms.  The intermittent left-sided ear issue has been present for several months but it seems to be compounded by recent maxillary sinusitis.  She had no focal neurologic deficits appreciated on exam including negative Romberg today.  I placed a referral to ENT for further evaluation.  Symptoms at this time do not appear to be vertiginous nor orthostatic in nature.  Diabetes and hyperlipidemia were not discussed on today's visit but given ongoing fatigue Future orders have been placed I have added additional labs to recheck some of the abnormal labs noted back in May  Orders Placed This Encounter  Procedures   CMP14+EGFR    Standing Status:   Future    Standing Expiration Date:   02/01/2022   Lipid panel    Standing Status:   Future    Standing Expiration Date:   02/01/2022   Bayer DCA Hb A1c Waived    Standing Status:   Future    Standing Expiration Date:   02/01/2022   TSH    Standing Status:   Future    Standing Expiration Date:   02/01/2022   T4, free    Standing Status:   Future    Standing Expiration Date:   02/01/2022   VITAMIN D  25 Hydroxy (Vit-D Deficiency, Fractures)    Standing Status:   Future    Standing Expiration Date:   02/01/2022   Pathologist smear review    Standing Status:   Future    Standing Expiration Date:   02/01/2022   Vitamin B12    Standing Status:   Future    Standing Expiration Date:   02/01/2022   Ambulatory referral to ENT    Referral Priority:   Routine    Referral Type:   Consultation    Referral Reason:   Specialty Services Required  Requested Specialty:   Otolaryngology    Number of Visits Requested:   1   No orders of the defined types were placed in this encounter.    Tracy Norlander, DO [place] [phone]

## 2021-02-01 NOTE — Addendum Note (Signed)
Addended by: [name] on: 02/01/2021 11:17 AM   Modules accepted: Orders

## 2021-02-02 ENCOUNTER — Other Ambulatory Visit: Payer: 59

## 2021-02-02 ENCOUNTER — Other Ambulatory Visit: Payer: Self-pay | Admitting: *Deleted

## 2021-02-02 ENCOUNTER — Other Ambulatory Visit: Payer: Self-pay

## 2021-02-02 MED ORDER — DOXYCYCLINE HYCLATE 100 MG PO TABS: 100.0000 mg | ORAL_TABLET | Freq: Two times a day (BID) | ORAL | 0 refills | Status: DC

## 2021-02-09 ENCOUNTER — Other Ambulatory Visit: Payer: 59

## 2021-02-09 ENCOUNTER — Other Ambulatory Visit: Payer: Self-pay

## 2021-02-09 DIAGNOSIS — E559 Vitamin D deficiency, unspecified: Secondary | ICD-10-CM

## 2021-02-09 DIAGNOSIS — E1169 Type 2 diabetes mellitus with other specified complication: Secondary | ICD-10-CM | POA: Diagnosis not present

## 2021-02-09 DIAGNOSIS — E119 Type 2 diabetes mellitus without complications: Secondary | ICD-10-CM

## 2021-02-09 DIAGNOSIS — D72829 Elevated white blood cell count, unspecified: Secondary | ICD-10-CM | POA: Diagnosis not present

## 2021-02-09 DIAGNOSIS — R5383 Other fatigue: Secondary | ICD-10-CM

## 2021-02-09 DIAGNOSIS — E785 Hyperlipidemia, unspecified: Secondary | ICD-10-CM

## 2021-02-09 LAB — BAYER DCA HB A1C WAIVED: HB A1C (BAYER DCA - WAIVED): 5.8 % — ABNORMAL HIGH (ref 4.8–5.6)

## 2021-02-10 ENCOUNTER — Other Ambulatory Visit: Payer: Self-pay

## 2021-02-10 LAB — CMP14+EGFR
ALT: 19 IU/L (ref 0–32)
AST: 24 IU/L (ref 0–40)
Albumin/Globulin Ratio: 2 (ref 1.2–2.2)
Albumin: 4.2 g/dL (ref 3.8–4.9)
Alkaline Phosphatase: 74 IU/L (ref 44–121)
BUN/Creatinine Ratio: 17 (ref 9–23)
BUN: 11 mg/dL (ref 6–24)
Bilirubin Total: 0.4 mg/dL (ref 0.0–1.2)
CO2: 24 mmol/L (ref 20–29)
Calcium: 10 mg/dL (ref 8.7–10.2)
Chloride: 100 mmol/L (ref 96–106)
Creatinine, Ser: 0.66 mg/dL (ref 0.57–1.00)
Globulin, Total: 2.1 g/dL (ref 1.5–4.5)
Glucose: 100 mg/dL — ABNORMAL HIGH (ref 70–99)
Potassium: 4.3 mmol/L (ref 3.5–5.2)
Sodium: 139 mmol/L (ref 134–144)
Total Protein: 6.3 g/dL (ref 6.0–8.5)
eGFR: 104 mL/min/{1.73_m2} (ref >59)

## 2021-02-10 LAB — LIPID PANEL
Chol/HDL Ratio: 3.7 ratio (ref 0.0–4.4)
Cholesterol, Total: 173 mg/dL (ref 100–199)
HDL: 47 mg/dL (ref >39)
LDL Chol Calc (NIH): 95 mg/dL (ref 0–99)
Triglycerides: 179 mg/dL — ABNORMAL HIGH (ref 0–149)
VLDL Cholesterol Cal: 31 mg/dL (ref 5–40)

## 2021-02-10 LAB — T4, FREE: Free T4: 1.05 ng/dL (ref 0.82–1.77)

## 2021-02-10 LAB — TSH: TSH: 5.82 u[IU]/mL — ABNORMAL HIGH (ref 0.450–4.500)

## 2021-02-10 LAB — VITAMIN D 25 HYDROXY (VIT D DEFICIENCY, FRACTURES): Vit D, 25-Hydroxy: 26.4 ng/mL — ABNORMAL LOW (ref 30.0–100.0)

## 2021-02-10 LAB — VITAMIN B12: Vitamin B-12: 461 pg/mL (ref 232–1245)

## 2021-02-11 LAB — PATHOLOGIST SMEAR REVIEW
Basophils Absolute: 0.1 10*3/uL (ref 0.0–0.2)
Basos: 1 %
EOS (ABSOLUTE): 0.5 10*3/uL — ABNORMAL HIGH (ref 0.0–0.4)
Eos: 5 %
Hematocrit: 39.7 % (ref 34.0–46.6)
Hemoglobin: 13.3 g/dL (ref 11.1–15.9)
Immature Grans (Abs): 0.1 10*3/uL (ref 0.0–0.1)
Immature Granulocytes: 1 %
Lymphocytes Absolute: 1.8 10*3/uL (ref 0.7–3.1)
Lymphs: 18 %
MCH: 31.5 pg (ref 26.6–33.0)
MCHC: 33.5 g/dL (ref 31.5–35.7)
MCV: 94 fL (ref 79–97)
Monocytes Absolute: 0.5 10*3/uL (ref 0.1–0.9)
Monocytes: 5 %
Neutrophils Absolute: 6.8 10*3/uL (ref 1.4–7.0)
Neutrophils: 70 %
Platelets: 412 10*3/uL (ref 150–450)
RBC: 4.22 x10E6/uL (ref 3.77–5.28)
RDW: 11.5 % — ABNORMAL LOW (ref 11.7–15.4)
WBC: 9.7 10*3/uL (ref 3.4–10.8)

## 2021-02-16 ENCOUNTER — Other Ambulatory Visit: Payer: Self-pay

## 2021-02-16 ENCOUNTER — Other Ambulatory Visit (HOSPITAL_COMMUNITY): Payer: Self-pay

## 2021-02-16 ENCOUNTER — Other Ambulatory Visit: Payer: Self-pay | Admitting: Family Medicine

## 2021-02-16 DIAGNOSIS — R7989 Other specified abnormal findings of blood chemistry: Secondary | ICD-10-CM

## 2021-02-16 DIAGNOSIS — E559 Vitamin D deficiency, unspecified: Secondary | ICD-10-CM

## 2021-02-16 MED ORDER — LEVOTHYROXINE SODIUM 25 MCG PO TABS
25.0000 ug | ORAL_TABLET | Freq: Every day | ORAL | 3 refills | Status: DC
Start: 2021-02-16 — End: 2021-03-30

## 2021-02-16 MED ORDER — VITAMIN D (ERGOCALCIFEROL) 1.25 MG (50000 UNIT) PO CAPS
50000.0000 [IU] | ORAL_CAPSULE | ORAL | 1 refills | Status: AC
  Filled 2021-02-16: qty 12, 84d supply, fill #0
  Filled 2021-05-10 (×2): qty 12, 84d supply, fill #1

## 2021-02-26 ENCOUNTER — Telehealth: Payer: Self-pay | Admitting: Pharmacist

## 2021-02-26 MED ORDER — TIRZEPATIDE 10 MG/0.5ML ~~LOC~~ SOAJ: 10.0000 mg | SUBCUTANEOUS | 3 refills | Status: DC

## 2021-02-26 NOTE — Telephone Encounter (Signed)
Increase mounjaro dose to 10mg  weekly Patient tolerating well

## 2021-03-09 ENCOUNTER — Other Ambulatory Visit: Payer: Self-pay | Admitting: Family Medicine

## 2021-03-09 DIAGNOSIS — R9431 Abnormal electrocardiogram [ECG] [EKG]: Secondary | ICD-10-CM

## 2021-03-09 DIAGNOSIS — R0789 Other chest pain: Secondary | ICD-10-CM

## 2021-03-09 MED ORDER — METOPROLOL TARTRATE 25 MG PO TABS: 12.5000 mg | ORAL_TABLET | Freq: Two times a day (BID) | ORAL | 0 refills | Status: DC

## 2021-03-09 NOTE — Progress Notes (Signed)
Patient reports some atypical chest pain that onset today.  Its not really associated with any activity.  She points to the left side of her chest as the source.  Denies any diaphoresis, nausea, vomiting, shortness of breath.  She is been under more stress due to some progression of her mother's illness.  Her medical history includes diabetes, hyperlipidemia and obesity

## 2021-03-16 ENCOUNTER — Ambulatory Visit: Payer: 59 | Admitting: Family Medicine

## 2021-03-30 ENCOUNTER — Other Ambulatory Visit (HOSPITAL_COMMUNITY): Payer: Self-pay

## 2021-03-30 ENCOUNTER — Other Ambulatory Visit: Payer: Self-pay | Admitting: *Deleted

## 2021-03-30 DIAGNOSIS — R9431 Abnormal electrocardiogram [ECG] [EKG]: Secondary | ICD-10-CM

## 2021-03-30 DIAGNOSIS — R7989 Other specified abnormal findings of blood chemistry: Secondary | ICD-10-CM

## 2021-03-30 DIAGNOSIS — R0789 Other chest pain: Secondary | ICD-10-CM

## 2021-03-30 MED ORDER — LEVOTHYROXINE SODIUM 25 MCG PO TABS
25.0000 ug | ORAL_TABLET | Freq: Every day | ORAL | 3 refills | Status: DC
Start: 2021-03-30 — End: 2021-07-22
  Filled 2021-03-30: qty 90, 90d supply, fill #0

## 2021-03-30 MED ORDER — METOPROLOL TARTRATE 25 MG PO TABS
12.5000 mg | ORAL_TABLET | Freq: Two times a day (BID) | ORAL | 3 refills | Status: DC
  Filled 2021-03-30: qty 90, 90d supply, fill #0

## 2021-03-31 ENCOUNTER — Telehealth: Payer: Self-pay | Admitting: Pharmacist

## 2021-03-31 DIAGNOSIS — E119 Type 2 diabetes mellitus without complications: Secondary | ICD-10-CM

## 2021-03-31 MED ORDER — TIRZEPATIDE 12.5 MG/0.5ML ~~LOC~~ SOAJ: 12.5000 mg | SUBCUTANEOUS | 3 refills | Status: DC

## 2021-03-31 NOTE — Telephone Encounter (Signed)
Weight 179lbs Tolerating well Increase dose to 12.5mg  weekly Denies personal and family history of Medullary thyroid cancer (MTC)

## 2021-04-02 ENCOUNTER — Other Ambulatory Visit (HOSPITAL_COMMUNITY): Payer: Self-pay

## 2021-04-05 NOTE — Progress Notes (Signed)
CARDIOLOGY CONSULT NOTE       Patient ID: [name] MRN: [id number] DOB/AGE: Aug 22, 1965  Referring Physician: Lajuana Ripple Primary Physician: [name], DO Primary Cardiologist: New Reason for Consultation: Chest pain   HPI:  55 y.o. referred by Dr Lajuana Ripple for chest pain History of anxiety, Hypothyroidism , HLD , DM, reflux and migraines Office visit 03/09/21 Atypical resting left sided chest pain Stress from mothers poor health No associated symptoms Started on beta blocker No history of vascular dx or CAD TSH has been elevated 5 range LDL 95 02/09/21 Recently started on Tirzepatide weekly [place] injection for DM-2  She has stress and anxiety See above regarding mom who has had recurrent breast/uterine cancer x 4 One Daughter getting married in September who lives in [place]  Patient has worked at [employer] office over 20 years   ROS All other systems reviewed and negative except as noted above  Past Medical History:  Diagnosis Date   Anxiety    Diabetes mellitus without complication ([place])    Elevated heart rate and blood pressure    Esophageal reflux    Migraines     Family History  Problem Relation Age of Onset   Breast cancer Mother    Cancer Mother        breast   Hypertension Mother    Diabetes Father    Diabetes Paternal Uncle    Hypertension Daughter    Hyperlipidemia Daughter     Social History   Socioeconomic History   Marital status: Widowed    Spouse name: Not on file   Number of children: Not on file   Years of education: Not on file   Highest education level: Not on file  Occupational History   Not on file  Tobacco Use   Smoking status: Every Day   Smokeless tobacco: Never  Vaping Use   Vaping Use: Never used  Substance and Sexual Activity   Alcohol use: Yes    Alcohol/week: 2.0 standard drinks    Types: 2 Standard drinks or equivalent per week   Drug use: No   Sexual activity: Yes  Other Topics Concern   Not on file   Social History Narrative   Not on file   Social Determinants of Health   Financial Resource Strain: Not on file  Food Insecurity: Not on file  Transportation Needs: Not on file  Physical Activity: Not on file  Stress: Not on file  Social Connections: Not on file  Intimate Partner Violence: Not on file    Past Surgical History:  Procedure Laterality Date   LAPAROSCOPIC VAGINAL HYSTERECTOMY N/A 05 10 2012      Current Outpatient Medications:    levothyroxine (SYNTHROID) 25 MCG tablet, Take 1 tablet by mouth daily., Disp: 90 tablet, Rfl: 3   lisinopril (ZESTRIL) 30 MG tablet, TAKE 1 TABLET BY MOUTH ONCE DAILY (Patient taking differently: Take 30 mg by mouth daily.), Disp: 90 tablet, Rfl: 3   LORazepam (ATIVAN) 0.5 MG tablet, Take 0.5 mg by mouth daily as needed for anxiety., Disp: , Rfl:    metoprolol tartrate (LOPRESSOR) 25 MG tablet, Take 1/2 tablets by mouth 2 times daily., Disp: 90 tablet, Rfl: 3   omeprazole (PRILOSEC) 20 MG capsule, TAKE 1 CAPSULE BY MOUTH TWICE DAILY BEFORE A MEAL (Patient taking differently: 20 mg daily.), Disp: 180 capsule, Rfl: 3   rosuvastatin (CRESTOR) 5 MG tablet, Take 1 tablet (5 mg total) by mouth every other day., Disp: 90 tablet, Rfl:  3   sertraline (ZOLOFT) 100 MG tablet, TAKE 1 AND 1/2 TABLETS BY MOUTH DAILY. (Patient taking differently: Take 150 mg by mouth daily.), Disp: 135 tablet, Rfl: 3   tirzepatide (MOUNJARO) 12.5 MG/0.5ML Pen, Inject 12.5 mg into the skin once a week. DX: e11.9 (Patient taking differently: Inject 10 mg into the skin once a week. DX: e11.9), Disp: 2 mL, Rfl: 3   Vitamin D, Ergocalciferol, (DRISDOL) 1.25 MG (50000 UNIT) CAPS capsule, Take 1 capsule (50,000 Units total) by mouth every 7 (seven) days as directed., Disp: 12 capsule, Rfl: 1   fluticasone (FLONASE) 50 MCG/ACT nasal spray, INSTILL 2 SPRAYS INTO BOTH NOSTRILS DAILY, Disp: 48 g, Rfl: 3    Physical Exam: Resp. rate 20, height 5\' 2"  (1.575 m), SpO2 97 %.    Affect  appropriate Healthy:  appears stated age 9: normal Neck supple with no adenopathy JVP normal no bruits no thyromegaly Lungs clear with no wheezing and good diaphragmatic motion Heart:  S1/S2 no murmur, no rub, gallop or click PMI normal Abdomen: benighn, BS positve, no tenderness, no AAA no bruit.  No HSM or HJR Distal pulses intact with no bruits No edema Neuro non-focal Skin warm and dry No muscular weakness   Labs:   Lab Results  Component Value Date   WBC 11.6 (H) 04/06/2021   HGB 14.6 04/06/2021   HCT 43.0 04/06/2021   MCV 92.5 04/06/2021   PLT 418 (H) 04/06/2021   No results for input(s): NA, K, CL, CO2, BUN, CREATININE, CALCIUM, PROT, BILITOT, ALKPHOS, ALT, AST, GLUCOSE in the last 168 hours.  Invalid input(s): LABALBU Lab Results  Component Value Date   CKMBINDEX 1.0 07/21/2015   TROPONINI <0.01 06/24/2019    Lab Results  Component Value Date   CHOL 173 02/09/2021   CHOL 245 (H) 09/02/2020   CHOL 216 (H) 07/14/2016   Lab Results  Component Value Date   HDL 47 02/09/2021   HDL 48 09/02/2020   HDL 53 07/14/2016   Lab Results  Component Value Date   LDLCALC 95 02/09/2021   LDLCALC 131 (H) 09/02/2020   LDLCALC 113 (H) 07/14/2016   Lab Results  Component Value Date   TRIG 179 (H) 02/09/2021   TRIG 370 (H) 09/02/2020   TRIG 249 (H) 07/14/2016   Lab Results  Component Value Date   CHOLHDL 3.7 02/09/2021   CHOLHDL 5.1 (H) 09/02/2020   CHOLHDL 4.1 07/14/2016   No results found for: LDLDIRECT    Radiology: DG Chest 2 View  Result Date: 04/06/2021 CLINICAL DATA:  Chest pain EXAM: CHEST - 2 VIEW COMPARISON:  Chest x-ray dated June 24, 2019 FINDINGS: The heart size and mediastinal contours are within normal limits. Both lungs are clear. The visualized skeletal structures are unremarkable.Old right rib fractures. IMPRESSION: No active cardiopulmonary disease. Electronically Signed   By: Yetta Glassman M.D.   On: 04/06/2021 13:17    EKG: ST rate  104 normal 04/08/21  04/15/2021 ST rate 112 normal    ASSESSMENT AND PLAN:   Chest Pain: atypical in setting of HLD, DM, HTN and increased family stress Shared decision making favor risk stratification with exercise myovue as her HR would be hard to get in range for cardiac CT HLD continue statin guide tightness of control with calcium score  DM:  Discussed low carb diet.  Target hemoglobin A1c is 6.5 or less.  Continue current medications. Anxiety/Depression:  continue Zoloft  Thyroid:  continue replacement synthroid TSH 5.8 a month ago ?  Increase dose    Ex Myovue   F/U PRN if low risk   Signed: Jenkins Rouge 04/15/2021, 2:11 PM

## 2021-04-06 ENCOUNTER — Other Ambulatory Visit: Payer: Self-pay

## 2021-04-06 ENCOUNTER — Emergency Department (HOSPITAL_COMMUNITY)
Admission: EM | Admit: 2021-04-06 | Discharge: 2021-04-06 | Disposition: A | Discharge status: Home / Self Care | Payer: 59 | Attending: Emergency Medicine | Admitting: Emergency Medicine

## 2021-04-06 ENCOUNTER — Encounter (HOSPITAL_COMMUNITY): Payer: Self-pay

## 2021-04-06 ENCOUNTER — Emergency Department (HOSPITAL_COMMUNITY): Payer: 59

## 2021-04-06 DIAGNOSIS — I10 Essential (primary) hypertension: Secondary | ICD-10-CM | POA: Diagnosis not present

## 2021-04-06 DIAGNOSIS — R002 Palpitations: Secondary | ICD-10-CM | POA: Insufficient documentation

## 2021-04-06 DIAGNOSIS — R0789 Other chest pain: Secondary | ICD-10-CM | POA: Diagnosis not present

## 2021-04-06 DIAGNOSIS — E119 Type 2 diabetes mellitus without complications: Secondary | ICD-10-CM | POA: Insufficient documentation

## 2021-04-06 DIAGNOSIS — F17200 Nicotine dependence, unspecified, uncomplicated: Secondary | ICD-10-CM | POA: Insufficient documentation

## 2021-04-06 DIAGNOSIS — R079 Chest pain, unspecified: Secondary | ICD-10-CM | POA: Diagnosis not present

## 2021-04-06 DIAGNOSIS — Z79899 Other long term (current) drug therapy: Secondary | ICD-10-CM | POA: Diagnosis not present

## 2021-04-06 DIAGNOSIS — S2231XA Fracture of one rib, right side, initial encounter for closed fracture: Secondary | ICD-10-CM | POA: Diagnosis not present

## 2021-04-06 LAB — D-DIMER, QUANTITATIVE: D-Dimer, Quant: 0.31 ug/mL-FEU (ref 0.00–0.50)

## 2021-04-06 LAB — TROPONIN I (HIGH SENSITIVITY)
Troponin I (High Sensitivity): 5 ng/L (ref <18)
Troponin I (High Sensitivity): 3 ng/L (ref <18)

## 2021-04-06 LAB — COMPREHENSIVE METABOLIC PANEL
ALT: 25 U/L (ref 0–44)
AST: 29 U/L (ref 15–41)
Albumin: 4 g/dL (ref 3.5–5.0)
Alkaline Phosphatase: 63 U/L (ref 38–126)
Anion gap: 10 (ref 5–15)
BUN: 14 mg/dL (ref 6–20)
CO2: 24 mmol/L (ref 22–32)
Calcium: 9.9 mg/dL (ref 8.9–10.3)
Chloride: 103 mmol/L (ref 98–111)
Creatinine, Ser: 0.77 mg/dL (ref 0.44–1.00)
GFR, Estimated: >60 mL/min (ref >60)
Glucose, Bld: 127 mg/dL — ABNORMAL HIGH (ref 70–99)
Potassium: 3.6 mmol/L (ref 3.5–5.1)
Sodium: 137 mmol/L (ref 135–145)
Total Bilirubin: 0.8 mg/dL (ref 0.3–1.2)
Total Protein: 7.2 g/dL (ref 6.5–8.1)

## 2021-04-06 LAB — CBC
HCT: 43 % (ref 36.0–46.0)
Hemoglobin: 14.6 g/dL (ref 12.0–15.0)
MCH: 31.4 pg (ref 26.0–34.0)
MCHC: 34 g/dL (ref 30.0–36.0)
MCV: 92.5 fL (ref 80.0–100.0)
Platelets: 418 10*3/uL — ABNORMAL HIGH (ref 150–400)
RBC: 4.65 MIL/uL (ref 3.87–5.11)
RDW: 12 % (ref 11.5–15.5)
WBC: 11.6 10*3/uL — ABNORMAL HIGH (ref 4.0–10.5)
nRBC: 0 % (ref 0.0–0.2)

## 2021-04-06 LAB — TSH: TSH: 2.71 u[IU]/mL (ref 0.350–4.500)

## 2021-04-06 LAB — MAGNESIUM: Magnesium: 1.9 mg/dL (ref 1.7–2.4)

## 2021-04-06 LAB — LIPASE, BLOOD: Lipase: 35 U/L (ref 11–51)

## 2021-04-06 MED ORDER — NITROGLYCERIN 0.4 MG SL SUBL: 0.4000 mg | SUBLINGUAL_TABLET | SUBLINGUAL | Status: DC | PRN

## 2021-04-06 NOTE — Discharge Instructions (Addendum)
Your work-up today was overall reassuring.  Your troponin was negative both times we checked and your D-dimer was negative ruling out pulmonary embolism.  Your chest x-ray was also reassuring as were the other labs.  Please follow-up with your cardiology team next week as we discussed and rest and stay hydrated.  We had a shared decision-making conversation offering cardiology consultation tonight however as you are feeling much better we agree it is reasonable to let you go home.  If any symptoms are to change or worsen acutely, please return to nearest emergency department.

## 2021-04-06 NOTE — ED Triage Notes (Signed)
Pt reports chest pain, seen at PCP this morning, given 1 nitro and ASA with some relief of pain. Pain now 6/10. Pt a.o, anxious.

## 2021-04-06 NOTE — ED Notes (Signed)
DC instructions reviewed with pt. Pt verbalized understanding.  PT DC.  

## 2021-04-06 NOTE — ED Provider Notes (Signed)
[facility] EMERGENCY DEPARTMENT Provider Note   CSN: [id number] Arrival date & time: 04/06/21  1235     History Chief Complaint  Patient presents with   Chest Pain    [name] is a 55 y.o. female.  The history is provided by the patient and medical records. No language interpreter was used.  Chest Pain Pain location:  L chest Pain quality: aching, dull and sharp   Pain radiates to:  Does not radiate Pain severity:  Moderate Onset quality:  Gradual Duration:  1 day Relieved by:  Nothing Worsened by:  Nothing Ineffective treatments:  None tried Associated symptoms: heartburn and palpitations   Associated symptoms: no abdominal pain, no altered mental status, no back pain, no cough, no diaphoresis, no dizziness, no fatigue, no fever, no headache, no nausea, no shortness of breath, no vomiting and no weakness   Risk factors: hypertension   Risk factors: no coronary artery disease, not female and no prior DVT/PE    HPI: A 55 year old patient with a history of treated diabetes, hypertension and obesity presents for evaluation of chest pain. Initial onset of pain was more than 6 hours ago. The patient's chest pain is described as heaviness/pressure/tightness, is sharp, is not worse with exertion and is relieved by nitroglycerin. The patient's chest pain is middle- or left-sided, is not well-localized and does not radiate to the arms/jaw/neck. The patient does not complain of nausea and denies diaphoresis. The patient has a family history of coronary artery disease in a first-degree relative with onset less than age 43. The patient has no history of stroke, has no history of peripheral artery disease, has not smoked in the past 90 days and has no history of hypercholesterolemia.   Past Medical History:  Diagnosis Date   Anxiety    Diabetes mellitus without complication ([place])    Elevated heart rate and blood pressure    Esophageal reflux    Migraines      Patient Active Problem List   Diagnosis Date Noted   Hyperlipidemia associated with type 2 diabetes mellitus ([place]) 09/25/2020   Obesity (BMI 30.0-34.9) 09/25/2020   Obstructive sleep apnea 09/29/2016   Abscess of breast 05/05/2016   Breast pain 07/21/2015   Trigger finger, acquired 05/07/2015   Gastroesophageal reflux disease without esophagitis 11/04/2014   Hypertension associated with diabetes ([place]) 11/04/2014   Depression 11/04/2014   Generalized anxiety disorder 11/04/2014   Annual physical exam 11/04/2014   BMI 34.0-34.9,adult 11/04/2014   Controlled type 2 diabetes mellitus without complication, without long-term current use of insulin ([place]) 03/21/2014   Abnormal transaminases 12/12/2012    Past Surgical History:  Procedure Laterality Date   LAPAROSCOPIC VAGINAL HYSTERECTOMY N/A 05 10 2012     OB History   No obstetric history on file.     Family History  Problem Relation Age of Onset   Breast cancer Mother    Cancer Mother        breast   Hypertension Mother    Diabetes Father    Diabetes Paternal Uncle    Hypertension Daughter    Hyperlipidemia Daughter     Social History   Tobacco Use   Smoking status: Every Day   Smokeless tobacco: Never  Vaping Use   Vaping Use: Never used  Substance Use Topics   Alcohol use: Yes    Alcohol/week: 2.0 standard drinks    Types: 2 Standard drinks or equivalent per week   Drug use: No  Home Medications Prior to Admission medications   Medication Sig Start Date End Date Taking? Authorizing Provider  albuterol (VENTOLIN HFA) 108 (90 Base) MCG/ACT inhaler Inhale 2 puffs into the lungs every 6 (six) hours as needed for wheezing or shortness of breath. 01/18/21   [name], DO  doxycycline (VIBRA-TABS) 100 MG tablet Take 1 tablet (100 mg total) by mouth 2 (two) times daily. 1 po bid 02/02/21   Ronnie Doss M, DO  fluticasone (FLONASE) 50 MCG/ACT nasal spray INSTILL 2 SPRAYS INTO BOTH NOSTRILS DAILY  01/20/20 01/19/21  Ronnie Doss M, DO  levothyroxine (SYNTHROID) 25 MCG tablet Take 1 tablet by mouth daily. 03/30/21   Ronnie Doss M, DO  lisinopril (ZESTRIL) 30 MG tablet TAKE 1 TABLET BY MOUTH ONCE DAILY 09/25/20 09/25/21  Ronnie Doss M, DO  LORazepam (ATIVAN) 0.5 MG tablet Take 0.5 mg by mouth daily as needed.    [provider]  metoprolol tartrate (LOPRESSOR) 25 MG tablet Take 1/2 tablets by mouth 2 times daily. 03/30/21   Ronnie Doss M, DO  omeprazole (PRILOSEC) 20 MG capsule TAKE 1 CAPSULE BY MOUTH TWICE DAILY BEFORE A MEAL 09/25/20 09/25/21  Ronnie Doss M, DO  rosuvastatin (CRESTOR) 5 MG tablet Take 1 tablet (5 mg total) by mouth every other day. 09/25/20   [name], DO  sertraline (ZOLOFT) 100 MG tablet TAKE 1 AND 1/2 TABLETS BY MOUTH DAILY. 09/25/20 09/25/21  [name], DO  tirzepatide [facility]) 12.5 MG/0.5ML Pen Inject 12.5 mg into the skin once a week. DX: e11.9 03/31/21   [name], DO  Vitamin D, Ergocalciferol, (DRISDOL) 1.25 MG (50000 UNIT) CAPS capsule Take 1 capsule (50,000 Units total) by mouth every 7 (seven) days as directed. 02/16/21 05/12/21  [name], DO    Allergies    Penicillins and Sulfa antibiotics  Review of Systems   Review of Systems  Constitutional:  Negative for chills, diaphoresis, fatigue and fever.  HENT:  Negative for congestion.   Eyes:  Negative for visual disturbance.  Respiratory:  Negative for cough, chest tightness, shortness of breath, wheezing and stridor.   Cardiovascular:  Positive for chest pain and palpitations. Negative for leg swelling.  Gastrointestinal:  Positive for heartburn. Negative for abdominal pain, constipation, diarrhea, nausea and vomiting.  Genitourinary:  Negative for flank pain.  Musculoskeletal:  Negative for back pain.  Skin:  Negative for rash and wound.  Neurological:  Negative for dizziness, weakness, light-headedness and headaches.   Psychiatric/Behavioral:  Negative for agitation and confusion.   All other systems reviewed and are negative.  Physical Exam Updated Vital Signs BP (!) 151/102 (BP Location: Right Arm)    Pulse 99    Temp 98.6 F (37 C) (Oral)    Resp 18    SpO2 96%   Physical Exam Vitals and nursing note reviewed.  Constitutional:      General: She is not in acute distress.    Appearance: She is well-developed. She is not ill-appearing, toxic-appearing or diaphoretic.  HENT:     Head: Normocephalic and atraumatic.  Eyes:     Conjunctiva/sclera: Conjunctivae normal.     Pupils: Pupils are equal, round, and reactive to light.  Cardiovascular:     Rate and Rhythm: Normal rate and regular rhythm.     Heart sounds: Normal heart sounds. No murmur heard. Pulmonary:     Effort: Pulmonary effort is normal. No respiratory distress.     Breath sounds: Normal breath sounds. No decreased breath  sounds, wheezing, rhonchi or rales.  Chest:     Chest wall: No tenderness.  Abdominal:     Palpations: Abdomen is soft.     Tenderness: There is no abdominal tenderness.  Musculoskeletal:        General: No swelling.     Cervical back: Neck supple.     Right lower leg: No tenderness. No edema.     Left lower leg: No tenderness. No edema.  Skin:    General: Skin is warm and dry.     Capillary Refill: Capillary refill takes less than 2 seconds.     Findings: No erythema.  Neurological:     General: No focal deficit present.     Mental Status: She is alert.  Psychiatric:        Mood and Affect: Mood normal.    ED Results / Procedures / Treatments   Labs (all labs ordered are listed, but only abnormal results are displayed) Labs Reviewed  CBC - Abnormal; Notable for the following components:      Result Value   WBC 11.6 (*)    Platelets 418 (*)    All other components within normal limits  COMPREHENSIVE METABOLIC PANEL - Abnormal; Notable for the following components:   Glucose, Bld 127 (*)    All  other components within normal limits  D-DIMER, QUANTITATIVE  TSH  MAGNESIUM  LIPASE, BLOOD  TROPONIN I (HIGH SENSITIVITY)  TROPONIN I (HIGH SENSITIVITY)    EKG EKG Interpretation  Date/Time:  Tuesday April 06 2021 12:31:30 EST Ventricular Rate:  104 PR Interval:  170 QRS Duration: 86 QT Interval:  348 QTC Calculation: 457 R Axis:   18 Text Interpretation: Sinus tachycardia Septal infarct , age undetermined Abnormal ECG When compared to prior, similar appearance. No STEMI Confirmed by Antony Blackbird [phone]) on 04/06/2021 4:32:35 PM  Radiology DG Chest 2 View  Result Date: 04/06/2021 CLINICAL DATA:  Chest pain EXAM: CHEST - 2 VIEW COMPARISON:  Chest x-ray dated June 24, 2019 FINDINGS: The heart size and mediastinal contours are within normal limits. Both lungs are clear. The visualized skeletal structures are unremarkable.Old right rib fractures. IMPRESSION: No active cardiopulmonary disease. Electronically Signed   By: Yetta Glassman M.D.   On: 04/06/2021 13:17    Procedures Procedures   Medications Ordered in ED Medications  nitroGLYCERIN (NITROSTAT) SL tablet 0.4 mg (has no administration in time range)    ED Course  I have reviewed the triage vital signs and the nursing notes.  Pertinent labs & imaging results that were available during my care of the patient were reviewed by me and considered in my medical decision making (see chart for details).    MDM Rules/Calculators/A&P HEAR Score: [employer] is a 55 y.o. female with a past medical history significant for diabetes, GERD, hypertension, anxiety, migraines, and previous hysterectomy who presents with chest pain.  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  She does report that over the last 3 weeks she has had several episodes of chest discomfort that eased off and she is post see cardiology  next week.  She says that the discomfort hit her and was a aching and sharp/pressure in her left chest that did not seem to radiate.  It has waxed and waned  but has never left today.  She reports it feels very different than her GERD and reflux troubles in the past.  It is not exertional but she does report she feels it more with deep breathing.  She denies fevers, chills, exertion, or cough.  She denies shortness of breath or diaphoresis with it.  Nuys nausea or vomiting.  Denies any trauma.  Denies any new edema or leg swelling but does say she has had some recent leg discomfort bilaterally.  She denies any leg pain at this time.  No reported leg swelling now.  She does report some palpitations with it.  Patient says she went to work at the family medicine clinic today and after telling her supervisor ended up getting EKGs that did not show STEMI but she was given some nitro that improved her symptoms.  She presents for further evaluation.  On my exam, lungs are clear and I do not hear a murmur.  Chest is nontender.  Abdomen is nontender.  I cannot reproduce her discomfort.  Pulses were intact and symmetric throughout.  Legs are nontender and nonedematous.  No abdominal tenderness either in the right upper quadrant or epigastric area.  Patient otherwise well-appearing but still has a 5 out of 10 chest discomfort on the left side.  Had a shared decision made conversation patient initially.  We did agree to get a delta troponin as the initial 1 in triage was normal.  Her EKG did not show any evidence of STEMI or acute MI.  We did add on a D-dimer due to the somewhat pleuritic symptoms and patient agrees.  We will get other screening labs as well.  Will give nitro to see if this helps her symptoms. Heat score calculated as a 4.   Anticipate reassessment after work-up to determine disposition.  Patient's delta troponin was negative and her D-dimer was also negative.  work-up otherwise reassuring.  On  reassessment, patient does not want [place] to consult cardiology would rather be discharged home.  Patient agrees to go to her cardiologist appointment next week and understand strict return precautions.  Patient did report that a few member recently passed away so Tak[place]ubo cardiomyopathy also considered however given her reassuring work-up we think it is reasonable to let her go home tonight.  Patient understands return precautions and had no other questions or concerns and was discharged in good condition with improved symptoms  Final Clinical Impression(s) / ED Diagnoses Final diagnoses:  Atypical chest pain    Rx / DC Orders ED Discharge Orders     None       Clinical Impression: 1. Atypical chest pain     Disposition: Discharge  Condition: Good  I have discussed the results, Dx and Tx plan with the pt(& family if present). He/she/they expressed understanding and agree(s) with the plan. Discharge instructions discussed at great length. Strict return precautions discussed and pt &/or family have verbalized understanding of the instructions. No further questions at time of discharge.    New Prescriptions   No medications on file    Follow Up: [place], [place] [address] [phone]     your cardiologist next week     [place] [address] [id number] mc [place]       Riley Papin, Gwenyth Allegra, MD 04/06/21 [phone]

## 2021-04-06 NOTE — ED Provider Notes (Signed)
Emergency Medicine Provider Triage Evaluation Note  [name] , a 55 y.o. female  was evaluated in triage.  Pt complains of chest pain.  Pain is midsternal and deep.  No radiation.  No associated vomiting or diaphoresis.  Patient awoke with pain this morning.  8 out of 10 and is worse, now 6 out of 10.  She was seen at her PCPs office with normal EKG.  She was given nitroglycerin and Maalox which seemed to help, but pain is now returning.  Has also received aspirin.  She has a history of hypertension, diabetes, smoking, hyperlipidemia, strong family history of cardiac disease.  She has had a racing heart and a couple minor episodes of chest pain recently.  Due to follow-up with cardiology this week.  She did eat a greasy meal this morning prior to pain starting.  Review of Systems  Positive: Chest pain Negative: Shortness of breath  Physical Exam  BP (!) 148/110 (BP Location: Right Arm)    Pulse (!) 105    Temp 98.6 F (37 C) (Oral)    Resp 16    SpO2 96%  Gen:   Awake, no distress   Resp:  Normal effort  MSK:   Moves extremities without difficulty  Other:  Lungs clear to auscultation bilaterally, no murmurs rubs or gallops heard  Medical Decision Making  Medically screening exam initiated at 12:54 PM.  Appropriate orders placed.  [name] was informed that the remainder of the evaluation will be completed by another provider, this initial triage assessment does not replace that evaluation, and the importance of remaining in the ED until their evaluation is complete.  EKG reviewed, no signs of STEMI.   [name], PA-C 04/06/21 1256    [name], MD 04/06/21 1356

## 2021-04-08 ENCOUNTER — Other Ambulatory Visit: Payer: Self-pay | Admitting: Family Medicine

## 2021-04-08 DIAGNOSIS — E785 Hyperlipidemia, unspecified: Secondary | ICD-10-CM

## 2021-04-08 MED ORDER — ROSUVASTATIN CALCIUM 5 MG PO TABS: 5.0000 mg | ORAL_TABLET | ORAL | 3 refills | Status: DC

## 2021-04-15 ENCOUNTER — Encounter: Payer: Self-pay | Admitting: Cardiovascular Disease

## 2021-04-15 ENCOUNTER — Encounter: Payer: Self-pay | Admitting: *Deleted

## 2021-04-15 ENCOUNTER — Other Ambulatory Visit: Payer: Self-pay

## 2021-04-15 ENCOUNTER — Ambulatory Visit: Payer: 59 | Admitting: Cardiovascular Disease

## 2021-04-15 VITALS — BP 150/90 | HR 112 | Resp 20 | Ht 62.0 in | Wt 182.0 lb

## 2021-04-15 DIAGNOSIS — E119 Type 2 diabetes mellitus without complications: Secondary | ICD-10-CM | POA: Diagnosis not present

## 2021-04-15 DIAGNOSIS — E782 Mixed hyperlipidemia: Secondary | ICD-10-CM

## 2021-04-15 DIAGNOSIS — R079 Chest pain, unspecified: Secondary | ICD-10-CM

## 2021-04-15 NOTE — Patient Instructions (Addendum)
Medication Instructions:  Your physician recommends that you continue on your current medications as directed. Please refer to the Current Medication list given to you today.  Hold Lopressor the morning of the stress test.   *If you need a refill on your cardiac medications before your next appointment, please call your pharmacy*   Lab Work: NONE   If you have labs (blood work) drawn today and your tests are completely normal, you will receive your results only by: [place] (if you have MyChart) OR A paper copy in the mail If you have any lab test that is abnormal or we need to change your treatment, we will call you to review the results.   Testing/Procedures: Your physician has requested that you have en exercise stress myoview. For further information please visit [email or web address]. Please follow instruction sheet, as given.    Follow-Up: At [facility], you and your health needs are our priority.  As part of our continuing mission to provide you with exceptional heart care, we have created designated Provider Care Teams.  These Care Teams include your primary Cardiologist (physician) and Advanced Practice Providers (APPs -  Physician Assistants and Nurse Practitioners) who all work together to provide you with the care you need, when you need it.  We recommend signing up for the patient portal called "MyChart".  Sign up information is provided on this After Visit Summary.  MyChart is used to connect with patients for Virtual Visits (Telemedicine).  Patients are able to view lab/test results, encounter notes, upcoming appointments, etc.  Non-urgent messages can be sent to your provider as well.   To learn more about what you can do with MyChart, go to [email or web address].    Your next appointment:    As Needed   The format for your next appointment:   In Person  Provider:   Jenkins Rouge, MD    Other Instructions Thank you for choosing [facility]!

## 2021-04-21 ENCOUNTER — Other Ambulatory Visit: Payer: Self-pay | Admitting: Family Medicine

## 2021-04-21 ENCOUNTER — Other Ambulatory Visit (HOSPITAL_COMMUNITY): Payer: Self-pay

## 2021-04-21 DIAGNOSIS — E785 Hyperlipidemia, unspecified: Secondary | ICD-10-CM

## 2021-04-21 DIAGNOSIS — E1169 Type 2 diabetes mellitus with other specified complication: Secondary | ICD-10-CM

## 2021-04-21 MED ORDER — ROSUVASTATIN CALCIUM 5 MG PO TABS
5.0000 mg | ORAL_TABLET | ORAL | 3 refills | Status: DC
  Filled 2021-04-21: qty 45, 90d supply, fill #0

## 2021-04-22 DIAGNOSIS — H93292 Other abnormal auditory perceptions, left ear: Secondary | ICD-10-CM | POA: Diagnosis not present

## 2021-05-05 ENCOUNTER — Telehealth: Payer: Self-pay | Admitting: Pharmacist

## 2021-05-05 DIAGNOSIS — E119 Type 2 diabetes mellitus without complications: Secondary | ICD-10-CM

## 2021-05-05 MED ORDER — TIRZEPATIDE 15 MG/0.5ML ~~LOC~~ SOAJ: 15.0000 mg | SUBCUTANEOUS | 5 refills | Status: DC

## 2021-05-05 NOTE — Telephone Encounter (Signed)
Tolerating well Denies personal and family history of Medullary thyroid cancer (MTC) Will increase to max dose of 15mg  sq weekly Patient has lost 17lbs

## 2021-05-07 ENCOUNTER — Ambulatory Visit (INDEPENDENT_AMBULATORY_CARE_PROVIDER_SITE_OTHER): Payer: 59 | Admitting: Family Medicine

## 2021-05-07 ENCOUNTER — Encounter: Payer: Self-pay | Admitting: Family Medicine

## 2021-05-07 VITALS — BP 144/79 | HR 93 | Temp 98.4°F | Ht 62.0 in | Wt 174.0 lb

## 2021-05-07 DIAGNOSIS — M25552 Pain in left hip: Secondary | ICD-10-CM | POA: Diagnosis not present

## 2021-05-07 MED ORDER — METHYLPREDNISOLONE ACETATE 80 MG/ML IJ SUSP
80.0000 mg | Freq: Once | INTRAMUSCULAR | Status: AC
  Administered 2021-05-07: 80 mg via INTRAMUSCULAR

## 2021-05-07 NOTE — Progress Notes (Signed)
Subjective: CC: Left-sided hip pain PCP: [name], DO [name] is a 56 y.o. female presenting to clinic today for:  1.  Left-sided hip pain Patient reports acute onset after she went dancing last Friday evening.  She does admit that the pain has gotten gradually better but is still pretty pronounced.  She often will have to hold her left hip/buttock in order to ambulate comfortably.  She has been utilizing oral NSAIDs without any resolution.  This is happened previously.  No weakness of the leg or sensory changes reported   ROS: Per HPI  Allergies  Allergen Reactions   Penicillins Anaphylaxis   Sulfa Antibiotics Rash   Past Medical History:  Diagnosis Date   Anxiety    Diabetes mellitus without complication (HCC)    Elevated heart rate and blood pressure    Esophageal reflux    Migraines     Current Outpatient Medications:    fluticasone (FLONASE) 50 MCG/ACT nasal spray, INSTILL 2 SPRAYS INTO BOTH NOSTRILS DAILY, Disp: 48 g, Rfl: 3   levothyroxine (SYNTHROID) 25 MCG tablet, Take 1 tablet by mouth daily., Disp: 90 tablet, Rfl: 3   lisinopril (ZESTRIL) 30 MG tablet, TAKE 1 TABLET BY MOUTH ONCE DAILY (Patient taking differently: Take 30 mg by mouth daily.), Disp: 90 tablet, Rfl: 3   LORazepam (ATIVAN) 0.5 MG tablet, Take 0.5 mg by mouth daily as needed for anxiety., Disp: , Rfl:    metoprolol tartrate (LOPRESSOR) 25 MG tablet, Take 1/2 tablets by mouth 2 times daily., Disp: 90 tablet, Rfl: 3   omeprazole (PRILOSEC) 20 MG capsule, TAKE 1 CAPSULE BY MOUTH TWICE DAILY BEFORE A MEAL (Patient taking differently: 20 mg daily.), Disp: 180 capsule, Rfl: 3   rosuvastatin (CRESTOR) 5 MG tablet, Take 1 tablet (5 mg total) by mouth every other day., Disp: 90 tablet, Rfl: 3   sertraline (ZOLOFT) 100 MG tablet, TAKE 1 AND 1/2 TABLETS BY MOUTH DAILY. (Patient taking differently: Take 150 mg by mouth daily.), Disp: 135 tablet, Rfl: 3   tirzepatide (MOUNJARO) 15 MG/0.5ML  Pen, Inject 15 mg into the skin once a week. DX: E11.65, Disp: 6 mL, Rfl: 5   Vitamin D, Ergocalciferol, (DRISDOL) 1.25 MG (50000 UNIT) CAPS capsule, Take 1 capsule (50,000 Units total) by mouth every 7 (seven) days as directed., Disp: 12 capsule, Rfl: 1 Social History   Socioeconomic History   Marital status: Widowed    Spouse name: Not on file   Number of children: Not on file   Years of education: Not on file   Highest education level: Not on file  Occupational History   Not on file  Tobacco Use   Smoking status: Every Day   Smokeless tobacco: Never  Vaping Use   Vaping Use: Never used  Substance and Sexual Activity   Alcohol use: Yes    Alcohol/week: 2.0 standard drinks    Types: 2 Standard drinks or equivalent per week   Drug use: No   Sexual activity: Yes  Other Topics Concern   Not on file  Social History Narrative   Not on file   Social Determinants of Health   Financial Resource Strain: Not on file  Food Insecurity: Not on file  Transportation Needs: Not on file  Physical Activity: Not on file  Stress: Not on file  Social Connections: Not on file  Intimate Partner Violence: Not on file   Family History  Problem Relation Age of Onset   Breast cancer Mother  Cancer Mother        breast   Hypertension Mother    Diabetes Father    Diabetes Paternal Uncle    Hypertension Daughter    Hyperlipidemia Daughter     Objective: Office vital signs reviewed. BP (!) 144/79    Pulse 93    Temp 98.4 F (36.9 C)    Ht 5\' 2"  (1.575 m)    Wt 174 lb (78.9 kg)    SpO2 94%    BMI 31.83 kg/m   Physical Examination:  General: Awake, alert, well nourished, No acute distress Cardio: regular rate and rhythm, S1S2 heard, no murmurs appreciated Pulm: clear to auscultation bilaterally, no wheezes, rhonchi or rales; normal work of breathing on room air MSK: Ambulating independently.  She has exquisite tenderness palpation over the insertion point of the gluteus medius.  No  tenderness palpation over the trochanteric bursa.  She has some pain reproducible with both FADIR and FABER. 5/5 lower extremity strength bilaterally  Assessment/ Plan: 56 y.o. female   Left hip pain - Plan: methylPREDNISolone acetate (DEPO-MEDROL) injection 80 mg  Suspect that this is really pain referred from her insertion of the gluteus medius.  I think she really has more of a tendinitis.  She really did not have a discrete tenderness to palpation over the trochanteric bursa.  Her hip exam was fairly unremarkable.  She was given a dose of Depo-Medrol intramuscularly.  A1c was controlled last visit.  Continue stretching.  Continue to modify activity.  Follow-up as needed  No orders of the defined types were placed in this encounter.  No orders of the defined types were placed in this encounter.    [name], DO [place] [phone]

## 2021-05-10 ENCOUNTER — Other Ambulatory Visit (HOSPITAL_COMMUNITY): Payer: Self-pay

## 2021-05-17 ENCOUNTER — Encounter (HOSPITAL_COMMUNITY): Payer: 59

## 2021-05-17 ENCOUNTER — Other Ambulatory Visit (HOSPITAL_COMMUNITY): Payer: 59

## 2021-05-17 ENCOUNTER — Ambulatory Visit (HOSPITAL_COMMUNITY): Admission: RE | Admit: 2021-05-17 | Payer: 59 | Source: Ambulatory Visit

## 2021-07-22 ENCOUNTER — Other Ambulatory Visit: Payer: Self-pay

## 2021-07-22 DIAGNOSIS — R9431 Abnormal electrocardiogram [ECG] [EKG]: Secondary | ICD-10-CM

## 2021-07-22 DIAGNOSIS — I152 Hypertension secondary to endocrine disorders: Secondary | ICD-10-CM

## 2021-07-22 DIAGNOSIS — E785 Hyperlipidemia, unspecified: Secondary | ICD-10-CM

## 2021-07-22 DIAGNOSIS — R7989 Other specified abnormal findings of blood chemistry: Secondary | ICD-10-CM

## 2021-07-22 DIAGNOSIS — R0789 Other chest pain: Secondary | ICD-10-CM

## 2021-07-22 MED ORDER — LISINOPRIL 30 MG PO TABS: ORAL_TABLET | Freq: Every day | ORAL | 3 refills | Status: DC

## 2021-07-22 MED ORDER — METOPROLOL TARTRATE 25 MG PO TABS: 12.5000 mg | ORAL_TABLET | Freq: Two times a day (BID) | ORAL | 3 refills | Status: DC

## 2021-07-22 MED ORDER — ROSUVASTATIN CALCIUM 5 MG PO TABS: 5.0000 mg | ORAL_TABLET | ORAL | 3 refills | Status: DC

## 2021-07-22 MED ORDER — LEVOTHYROXINE SODIUM 25 MCG PO TABS: 25.0000 ug | ORAL_TABLET | Freq: Every day | ORAL | 3 refills | Status: DC

## 2021-07-22 MED ORDER — SERTRALINE HCL 100 MG PO TABS: ORAL_TABLET | Freq: Every day | ORAL | 3 refills | Status: DC

## 2021-12-06 ENCOUNTER — Telehealth: Payer: Self-pay

## 2021-12-06 NOTE — Telephone Encounter (Signed)
Fax sent to the plan °Your PA has been faxed to the plan as a paper copy. Please contact the plan directly if you haven't received a determination in a typical timeframe. ° °You will be notified of the determination via fax. ° °

## 2022-01-25 NOTE — Telephone Encounter (Signed)
Please reach out to patient for new insurance card ( I need a full copy), what dose of mounjaro she is on and where she would like mounjaro sent  This PA was sent under her old insurance and I ran a benefits search and nothing came up.  I can't attempt PA without this info Will archive

## 2022-01-25 NOTE — Telephone Encounter (Signed)
Pt is currently not on mounjaro nd has not been on it for several months and plans to discuss this with dr g at her next appointment

## 2022-04-25 ENCOUNTER — Telehealth: Payer: Self-pay | Admitting: Family Medicine

## 2022-06-22 ENCOUNTER — Ambulatory Visit: Payer: Commercial Managed Care - PPO | Admitting: Family Medicine

## 2022-07-01 ENCOUNTER — Encounter: Payer: Self-pay | Admitting: Family Medicine

## 2022-07-01 ENCOUNTER — Ambulatory Visit (INDEPENDENT_AMBULATORY_CARE_PROVIDER_SITE_OTHER): Payer: Managed Care, Other (non HMO) | Admitting: Family Medicine

## 2022-07-01 VITALS — BP 150/92 | HR 105 | Temp 98.5°F | Ht 62.0 in | Wt 188.0 lb

## 2022-07-01 DIAGNOSIS — R300 Dysuria: Secondary | ICD-10-CM | POA: Diagnosis not present

## 2022-07-01 DIAGNOSIS — Z130 Encounter for screening for diseases of the blood and blood-forming organs and certain disorders involving the immune mechanism: Secondary | ICD-10-CM

## 2022-07-01 DIAGNOSIS — E1169 Type 2 diabetes mellitus with other specified complication: Secondary | ICD-10-CM | POA: Diagnosis not present

## 2022-07-01 DIAGNOSIS — E1159 Type 2 diabetes mellitus with other circulatory complications: Secondary | ICD-10-CM

## 2022-07-01 DIAGNOSIS — I152 Hypertension secondary to endocrine disorders: Secondary | ICD-10-CM

## 2022-07-01 DIAGNOSIS — L02211 Cutaneous abscess of abdominal wall: Secondary | ICD-10-CM | POA: Diagnosis not present

## 2022-07-01 DIAGNOSIS — Z0001 Encounter for general adult medical examination with abnormal findings: Secondary | ICD-10-CM | POA: Diagnosis not present

## 2022-07-01 DIAGNOSIS — Z0000 Encounter for general adult medical examination without abnormal findings: Secondary | ICD-10-CM

## 2022-07-01 DIAGNOSIS — M5416 Radiculopathy, lumbar region: Secondary | ICD-10-CM | POA: Diagnosis not present

## 2022-07-01 DIAGNOSIS — E669 Obesity, unspecified: Secondary | ICD-10-CM

## 2022-07-01 DIAGNOSIS — E039 Hypothyroidism, unspecified: Secondary | ICD-10-CM

## 2022-07-01 DIAGNOSIS — E119 Type 2 diabetes mellitus without complications: Secondary | ICD-10-CM

## 2022-07-01 DIAGNOSIS — E785 Hyperlipidemia, unspecified: Secondary | ICD-10-CM

## 2022-07-01 LAB — BAYER DCA HB A1C WAIVED: HB A1C (BAYER DCA - WAIVED): 6.1 % — ABNORMAL HIGH (ref 4.8–5.6)

## 2022-07-01 LAB — MICROSCOPIC EXAMINATION
Renal Epithel, UA: NONE SEEN /hpf
WBC, UA: NONE SEEN /hpf (ref 0–5)

## 2022-07-01 LAB — URINALYSIS, ROUTINE W REFLEX MICROSCOPIC
Bilirubin, UA: NEGATIVE
Glucose, UA: NEGATIVE
Ketones, UA: NEGATIVE
Leukocytes,UA: NEGATIVE
Nitrite, UA: NEGATIVE
Specific Gravity, UA: 1.015 (ref 1.005–1.030)
Urobilinogen, Ur: 1 mg/dL (ref 0.2–1.0)
pH, UA: 7.5 (ref 5.0–7.5)

## 2022-07-01 LAB — LIPID PANEL

## 2022-07-01 MED ORDER — TIRZEPATIDE 15 MG/0.5ML ~~LOC~~ SOAJ: 15.0000 mg | SUBCUTANEOUS | 0 refills | Status: DC

## 2022-07-01 MED ORDER — TIRZEPATIDE 12.5 MG/0.5ML ~~LOC~~ SOAJ: 12.5000 mg | SUBCUTANEOUS | 0 refills | Status: DC

## 2022-07-01 MED ORDER — SERTRALINE HCL 100 MG PO TABS: ORAL_TABLET | Freq: Every day | ORAL | 3 refills | Status: DC

## 2022-07-01 MED ORDER — METOPROLOL SUCCINATE ER 25 MG PO TB24: 25.0000 mg | ORAL_TABLET | Freq: Every day | ORAL | 3 refills | Status: DC

## 2022-07-01 MED ORDER — ROSUVASTATIN CALCIUM 5 MG PO TABS: 5.0000 mg | ORAL_TABLET | ORAL | 3 refills | Status: DC

## 2022-07-01 MED ORDER — TIRZEPATIDE 7.5 MG/0.5ML ~~LOC~~ SOAJ: 7.5000 mg | SUBCUTANEOUS | 0 refills | Status: DC

## 2022-07-01 MED ORDER — TIRZEPATIDE 10 MG/0.5ML ~~LOC~~ SOAJ: 10.0000 mg | SUBCUTANEOUS | 0 refills | Status: DC

## 2022-07-01 MED ORDER — DOXYCYCLINE HYCLATE 100 MG PO TABS: 100.0000 mg | ORAL_TABLET | Freq: Two times a day (BID) | ORAL | 0 refills | Status: DC

## 2022-07-01 MED ORDER — LEVOTHYROXINE SODIUM 25 MCG PO TABS: 25.0000 ug | ORAL_TABLET | Freq: Every day | ORAL | 3 refills | Status: DC

## 2022-07-01 MED ORDER — LISINOPRIL 30 MG PO TABS: ORAL_TABLET | Freq: Every day | ORAL | 3 refills | Status: DC

## 2022-07-01 MED ORDER — GABAPENTIN 300 MG PO CAPS: ORAL_CAPSULE | ORAL | 3 refills | Status: DC

## 2022-07-01 NOTE — Progress Notes (Unsigned)
[name] is a 57 y.o. female presents to office today for annual physical exam examination.    Concerns today include: 1. ***  Occupation: ***, Marital status: ***, Substance use: *** Diet: ***, Exercise: *** Last eye exam: *** Last dental exam: *** Last colonoscopy: *** Last mammogram: *** Last pap smear: *** Refills needed today: *** Immunizations needed: Immunization History  Administered Date(s) Administered   Hepatitis B 10/15/1991, 11/12/1991   Influenza Split 01/29/2013   Influenza,inj,Quad PF,6+ Mos 01/20/2014, 01/27/2015, 01/25/2016, 01/18/2017, 01/30/2018, 02/04/2019   PFIZER(Purple Top)SARS-COV-2 Vaccination 12/12/2019, 01/17/2020   Td 08/11/2016   Tdap 05/11/2009     Past Medical History:  Diagnosis Date   Anxiety    Diabetes mellitus without complication (HCC)    Elevated heart rate and blood pressure    Esophageal reflux    Migraines    Social History   Socioeconomic History   Marital status: Widowed    Spouse name: Not on file   Number of children: Not on file   Years of education: Not on file   Highest education level: Not on file  Occupational History   Not on file  Tobacco Use   Smoking status: Every Day   Smokeless tobacco: Never  Vaping Use   Vaping Use: Never used  Substance and Sexual Activity   Alcohol use: Yes    Alcohol/week: 2.0 standard drinks of alcohol    Types: 2 Standard drinks or equivalent per week   Drug use: No   Sexual activity: Yes  Other Topics Concern   Not on file  Social History Narrative   Not on file   Social Determinants of Health   Financial Resource Strain: Not on file  Food Insecurity: Not on file  Transportation Needs: Not on file  Physical Activity: Not on file  Stress: Not on file  Social Connections: Not on file  Intimate Partner Violence: Not on file   Past Surgical History:  Procedure Laterality Date   LAPAROSCOPIC VAGINAL HYSTERECTOMY N/A 05 10 2012   Family History  Problem  Relation Age of Onset   Breast cancer Mother    Cancer Mother        breast   Hypertension Mother    Diabetes Father    Diabetes Paternal Uncle    Hypertension Daughter    Hyperlipidemia Daughter     Current Outpatient Medications:    fluticasone (FLONASE) 50 MCG/ACT nasal spray, INSTILL 2 SPRAYS INTO BOTH NOSTRILS DAILY, Disp: 48 g, Rfl: 3   levothyroxine (SYNTHROID) 25 MCG tablet, Take 1 tablet by mouth daily., Disp: 90 tablet, Rfl: 3   lisinopril (ZESTRIL) 30 MG tablet, TAKE 1 TABLET BY MOUTH ONCE DAILY, Disp: 90 tablet, Rfl: 3   LORazepam (ATIVAN) 0.5 MG tablet, Take 0.5 mg by mouth daily as needed for anxiety., Disp: , Rfl:    metoprolol tartrate (LOPRESSOR) 25 MG tablet, Take 1/2 tablets by mouth 2 times daily., Disp: 90 tablet, Rfl: 3   omeprazole (PRILOSEC) 20 MG capsule, TAKE 1 CAPSULE BY MOUTH TWICE DAILY BEFORE A MEAL (Patient taking differently: 20 mg daily.), Disp: 180 capsule, Rfl: 3   rosuvastatin (CRESTOR) 5 MG tablet, Take 1 tablet (5 mg total) by mouth every other day., Disp: 90 tablet, Rfl: 3   sertraline (ZOLOFT) 100 MG tablet, TAKE 1 AND 1/2 TABLETS BY MOUTH DAILY., Disp: 135 tablet, Rfl: 3   tirzepatide (MOUNJARO) 15 MG/0.5ML Pen, Inject 15 mg into the skin once a week. DX: E11.65, Disp: 6  mL, Rfl: 5  Allergies  Allergen Reactions   Penicillins Anaphylaxis   Sulfa Antibiotics Rash     ROS: Review of Systems {ros; complete:30496}    Physical exam {Exam, Complete:[phone]}    Assessment/ Plan: [name] here for annual physical exam.   No problem-specific Assessment & Plan notes found for this encounter.   Counseled on healthy lifestyle choices, including diet (rich in fruits, vegetables and lean meats and low in salt and simple carbohydrates) and exercise (at least 30 minutes of moderate physical activity daily).  Patient to follow up in 1 year for annual exam or sooner if needed.  Tracy Peri M. Lajuana Ripple, DO

## 2022-07-02 ENCOUNTER — Other Ambulatory Visit: Payer: Self-pay | Admitting: Family Medicine

## 2022-07-02 DIAGNOSIS — E559 Vitamin D deficiency, unspecified: Secondary | ICD-10-CM

## 2022-07-02 LAB — CMP14+EGFR
ALT: 23 IU/L (ref 0–32)
AST: 21 IU/L (ref 0–40)
Albumin/Globulin Ratio: 1.7 (ref 1.2–2.2)
Albumin: 4.4 g/dL (ref 3.8–4.9)
Alkaline Phosphatase: 97 IU/L (ref 44–121)
BUN/Creatinine Ratio: 19 (ref 9–23)
BUN: 12 mg/dL (ref 6–24)
Bilirubin Total: 0.8 mg/dL (ref 0.0–1.2)
CO2: 21 mmol/L (ref 20–29)
Calcium: 9.8 mg/dL (ref 8.7–10.2)
Chloride: 101 mmol/L (ref 96–106)
Creatinine, Ser: 0.64 mg/dL (ref 0.57–1.00)
Globulin, Total: 2.6 g/dL (ref 1.5–4.5)
Glucose: 141 mg/dL — ABNORMAL HIGH (ref 70–99)
Potassium: 4.1 mmol/L (ref 3.5–5.2)
Sodium: 141 mmol/L (ref 134–144)
Total Protein: 7 g/dL (ref 6.0–8.5)
eGFR: 104 mL/min/{1.73_m2} (ref >59)

## 2022-07-02 LAB — CBC
Hematocrit: 47 % — ABNORMAL HIGH (ref 34.0–46.6)
Hemoglobin: 16 g/dL — ABNORMAL HIGH (ref 11.1–15.9)
MCH: 31.9 pg (ref 26.6–33.0)
MCHC: 34 g/dL (ref 31.5–35.7)
MCV: 94 fL (ref 79–97)
Platelets: 394 10*3/uL (ref 150–450)
RBC: 5.02 x10E6/uL (ref 3.77–5.28)
RDW: 11.8 % (ref 11.7–15.4)
WBC: 10.9 10*3/uL — ABNORMAL HIGH (ref 3.4–10.8)

## 2022-07-02 LAB — LIPID PANEL
Chol/HDL Ratio: 4.2 ratio (ref 0.0–4.4)
Cholesterol, Total: 243 mg/dL — ABNORMAL HIGH (ref 100–199)
HDL: 58 mg/dL (ref >39)
LDL Chol Calc (NIH): 141 mg/dL — ABNORMAL HIGH (ref 0–99)
Triglycerides: 248 mg/dL — ABNORMAL HIGH (ref 0–149)
VLDL Cholesterol Cal: 44 mg/dL — ABNORMAL HIGH (ref 5–40)

## 2022-07-02 LAB — T4, FREE: Free T4: 1.05 ng/dL (ref 0.82–1.77)

## 2022-07-02 LAB — TSH: TSH: 3.29 u[IU]/mL (ref 0.450–4.500)

## 2022-07-02 LAB — VITAMIN D 25 HYDROXY (VIT D DEFICIENCY, FRACTURES): Vit D, 25-Hydroxy: 24.1 ng/mL — ABNORMAL LOW (ref 30.0–100.0)

## 2022-07-02 MED ORDER — LANCETS MISC. MISC: 99 refills | Status: AC

## 2022-07-02 MED ORDER — BLOOD GLUCOSE TEST VI STRP: ORAL_STRIP | 99 refills | Status: AC

## 2022-07-02 MED ORDER — LANCET DEVICE MISC: 0 refills | Status: AC

## 2022-07-02 MED ORDER — VITAMIN D (ERGOCALCIFEROL) 1.25 MG (50000 UNIT) PO CAPS: 50000.0000 [IU] | ORAL_CAPSULE | ORAL | 3 refills | Status: DC

## 2022-07-02 MED ORDER — BLOOD GLUCOSE MONITORING SUPPL DEVI: 0 refills | Status: AC

## 2022-07-03 LAB — MICROALBUMIN / CREATININE URINE RATIO
Creatinine, Urine: 104.5 mg/dL
Microalb/Creat Ratio: 25 mg/g{creat} (ref 0–29)
Microalbumin, Urine: 25.7 ug/mL

## 2022-07-03 LAB — URINE CULTURE

## 2022-07-04 ENCOUNTER — Telehealth: Payer: Self-pay

## 2022-07-04 NOTE — Telephone Encounter (Signed)
Patient's [name] is requiring prior authorization and she would like to know if you will take care of this for her.  She is aware you are off today.

## 2022-07-06 ENCOUNTER — Telehealth: Payer: Self-pay | Admitting: Pharmacist

## 2022-07-06 NOTE — Telephone Encounter (Signed)
PA for mounjaro submitted--takes 1-5  days (sending as FYI if patient calls to ask)  Tried/failed [id number] meds: Trulicity [id number] Ozempic 2019-2020 Metformin 2015-2020 GI side effects Stable on mounjaro A1c at diagnosis was 7.8%

## 2022-07-12 ENCOUNTER — Encounter: Payer: Self-pay | Admitting: Family Medicine

## 2022-07-20 ENCOUNTER — Other Ambulatory Visit: Payer: Self-pay | Admitting: Family Medicine

## 2022-07-20 DIAGNOSIS — M5416 Radiculopathy, lumbar region: Secondary | ICD-10-CM

## 2022-07-26 NOTE — Telephone Encounter (Signed)
PA faxed to [facility] [phone] Sample given of Ozempic in the meantime

## 2022-07-29 ENCOUNTER — Telehealth: Payer: Self-pay | Admitting: Pharmacist

## 2022-07-29 NOTE — Telephone Encounter (Signed)
    07/29/2022 Name: SHEILDA ALDOUS MRN: [id number] DOB: 08-01-1965   Ozempic 0.5mg  sq weekly sample given to patient.  [facility]--awaiting response.  Denies personal and family history of Medullary thyroid cancer (MTC)   [name], PharmD, BCACP Clinical Pharmacist, [facility]

## 2022-08-03 ENCOUNTER — Telehealth: Payer: Self-pay | Admitting: Family Medicine

## 2022-08-03 ENCOUNTER — Other Ambulatory Visit: Payer: Self-pay

## 2022-08-03 ENCOUNTER — Ambulatory Visit: Payer: 59 | Attending: Family Medicine

## 2022-08-03 DIAGNOSIS — M5416 Radiculopathy, lumbar region: Secondary | ICD-10-CM | POA: Diagnosis present

## 2022-08-03 NOTE — Telephone Encounter (Signed)
[employer] called regarding PA for patients [facility].   Needs PA documents faxed to [phone] Attn: Vance Gather

## 2022-08-03 NOTE — Therapy (Signed)
OUTPATIENT PHYSICAL THERAPY THORACOLUMBAR EVALUATION   Patient Name: [name] MRN: [id number] DOB:Nov 07, 1965, 57 y.o., female Today's Date: 08/03/2022  END OF SESSION:  PT End of Session - 08/03/22 1302     Visit Number 1    Number of Visits 8    Date for PT Re-Evaluation 09/16/22    PT Start Time 1303    PT Stop Time 1348    PT Time Calculation (min) 45 min    Activity Tolerance Patient tolerated treatment well    Behavior During Therapy WFL for tasks assessed/performed             Past Medical History:  Diagnosis Date   Anxiety    Diabetes mellitus without complication    Elevated heart rate and blood pressure    Esophageal reflux    Migraines    Past Surgical History:  Procedure Laterality Date   LAPAROSCOPIC VAGINAL HYSTERECTOMY N/A 05 10 2012   Patient Active Problem List   Diagnosis Date Noted   Hyperlipidemia associated with type 2 diabetes mellitus 09/25/2020   Obesity (BMI 30.0-34.9) 09/25/2020   Obstructive sleep apnea 09/29/2016   Abscess of breast 05/05/2016   Breast pain 07/21/2015   Trigger finger, acquired 05/07/2015   Gastroesophageal reflux disease without esophagitis 11/04/2014   Hypertension associated with diabetes (HCC) 11/04/2014   Depression 11/04/2014   Generalized anxiety disorder 11/04/2014   Annual physical exam 11/04/2014   BMI 34.0-34.9,adult 11/04/2014   Controlled type 2 diabetes mellitus without complication, without long-term current use of insulin 03/21/2014   Abnormal transaminases 12/12/2012   REFERRING PROVIDER: Raliegh Ip, DO   REFERRING DIAG: Lumbar back pain with radiculopathy affecting left lower extremity   Rationale for Evaluation and Treatment: Rehabilitation  THERAPY DIAG:  Radiculopathy, lumbar region  ONSET DATE: August 2023   SUBJECTIVE:                                                                                                                                                                                            SUBJECTIVE STATEMENT: Patient reports that her back has been bothering her since August 2023 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 She was helping to lift her when she felt her back lock up. She notes that her back feels alright for the first six hours of her shift at work, but the second six hours is really rough. She notes that she has gained 20 pounds since this pain began as she has not been able to walk as much. She can hardly walk after working a 12 hour shift at work.   PERTINENT HISTORY:  Hypertension, diabetes, depression, anxiety, and current smoker  PAIN:  Are you having pain? Yes: NPRS scale: 10/10 Pain location: low back and left thigh Pain description: intermittent, burning Aggravating factors: working, standing, walking Relieving factors: sitting, medication  PRECAUTIONS: None  WEIGHT BEARING RESTRICTIONS: No  FALLS:  Has patient fallen in last 6 months? 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   POSTURE: No Significant postural limitations  PALPATION: TTP: left TFL, IT band ("sore), hip flexors ("fire")  LUMBAR JOINT MOBILITY:  L1-2: WFL and nonpainful  L2-3:  hypomobile and painful  L3-S1: WFL and nonpainful  LUMBAR ROM:   AROM eval  Flexion 54; reproduced pain with initiation of this motion  Extension 12  Right lateral flexion 25% limited   Left lateral flexion 25% limited; familiar pain  Right rotation 25% limitation; reproduced pain when returning to neutral  Left rotation 50% limited   (Blank rows = not tested)  LOWER EXTREMITY ROM: WFL for activities assessed  LOWER EXTREMITY MMT:    MMT Right eval Left eval  Hip flexion 4/5 4-/5  Hip extension    Hip abduction    Hip adduction    Hip internal rotation    Hip external rotation    Knee flexion 4+/5 4/5; slight pain  Knee extension 5/5 4+/5  Ankle dorsiflexion 4/5 4/5  Ankle plantarflexion    Ankle inversion    Ankle eversion     (Blank rows = not tested)  LUMBAR SPECIAL TESTS:  Straight leg raise test: Negative and FABER test: Negative  FUNCTIONAL TESTS:  Rolling to the left: familiar pain  GAIT: Assistive device utilized: None Level of assistance: Complete Independence Comments: decreased gait   TODAY'S TREATMENT:                                                                                                                              DATE:                                     4/17 EXERCISE LOG  Exercise Repetitions and Resistance Comments  Lower trunk rotation    Maisie Fus  stretch  2 x 30 seconds   Bridge  10 reps    Self STM with tennis ball          Blank cell = exercise not performed today   PATIENT EDUCATION:  Education details: POC, healing, prognosis, and goals for therapy Person educated: Patient Education method: Explanation Education comprehension: verbalized understanding  HOME EXERCISE PROGRAM: Today's interventions were provided as a HEP. She declined a handout, but reported understanding.  ASSESSMENT:  CLINICAL IMPRESSION: Patient is a 57 y.o. female who was seen today for physical therapy evaluation and treatment for left lumbar  radiculopathy.  She presented with moderate pain severity and irritability with lumbar active range of motion reproducing her familiar symptoms.  She also exhibited a slight reduction in muscular strength throughout the left lower extremity compared to the right.  Recommend that she continue with skilled physical therapy to address her impairments to return to her prior level of function.  OBJECTIVE IMPAIRMENTS: decreased activity tolerance, decreased mobility, difficulty walking, decreased ROM, decreased strength, hypomobility, impaired tone, and pain.   ACTIVITY LIMITATIONS: lifting, bending, standing, squatting, stairs, transfers, bed mobility, dressing, locomotion level, and caring for others  PARTICIPATION LIMITATIONS: meal prep, cleaning, laundry, shopping, community activity, and occupation  PERSONAL FACTORS: Time since onset of injury/illness/exacerbation and 3+ comorbidities: Hypertension, diabetes, depression, anxiety, and current smoker  are also affecting patient's functional outcome.   REHAB POTENTIAL: Fair    CLINICAL DECISION MAKING: Evolving/moderate complexity  EVALUATION COMPLEXITY: Moderate   GOALS: Goals reviewed with patient? Yes  LONG TERM GOALS: Target date: 08/31/22  Patient will be independent with her HEP. Baseline:  Goal status: INITIAL  2.  Patient will report being able to complete her shift at work without her familiar pain exceeding 8/10. Baseline:  Goal status: INITIAL  3.  Patient will be able to navigate at least 6 steps with a reciprocal pattern for improved household independence. Baseline:  Goal status: INITIAL  4.  Patient will be able to transition between left side-lying to supine without reproducing her familiar pain. Baseline:  Goal status: INITIAL  PLAN:  PT FREQUENCY: 2x/week  PT DURATION: 4 weeks  PLANNED INTERVENTIONS: Therapeutic exercises, Therapeutic activity, Neuromuscular re-education, Balance training, Gait training,  Patient/Family education, Self Care, Joint mobilization, Stair training, Dry Needling, Electrical stimulation, Spinal mobilization, Cryotherapy, Moist heat, Traction, Manual therapy, and Re-evaluation.  PLAN FOR NEXT SESSION: nustep, lumbar stabilization, lower extremity strengthening, manual therapy, and modalities as needed    Granville Lewis, PT 08/03/2022, 3:26 PM

## 2022-08-04 ENCOUNTER — Encounter: Payer: Self-pay | Admitting: Physical Therapy

## 2022-08-04 ENCOUNTER — Ambulatory Visit: Payer: 59 | Admitting: Physical Therapy

## 2022-08-04 DIAGNOSIS — M5416 Radiculopathy, lumbar region: Secondary | ICD-10-CM | POA: Diagnosis not present

## 2022-08-04 NOTE — Therapy (Signed)
OUTPATIENT PHYSICAL THERAPY THORACOLUMBAR TREATMENT   Patient Name: [name] MRN: [id number] DOB:Mar 31, 1966, 57 y.o., female Today's Date: 08/04/2022  END OF SESSION:  PT End of Session - 08/04/22 1357     Visit Number 2    Number of Visits 8    Date for PT Re-Evaluation 09/16/22    PT Start Time 1351    PT Stop Time 1436    PT Time Calculation (min) 45 min    Activity Tolerance Patient tolerated treatment well    Behavior During Therapy WFL for tasks assessed/performed            Past Medical History:  Diagnosis Date   Anxiety    Diabetes mellitus without complication    Elevated heart rate and blood pressure    Esophageal reflux    Migraines    Past Surgical History:  Procedure Laterality Date   LAPAROSCOPIC VAGINAL HYSTERECTOMY N/A 05 10 2012   Patient Active Problem List   Diagnosis Date Noted   Hyperlipidemia associated with type 2 diabetes mellitus 09/25/2020   Obesity (BMI 30.0-34.9) 09/25/2020   Obstructive sleep apnea 09/29/2016   Abscess of breast 05/05/2016   Breast pain 07/21/2015   Trigger finger, acquired 05/07/2015   Gastroesophageal reflux disease without esophagitis 11/04/2014   Hypertension associated with diabetes (HCC) 11/04/2014   Depression 11/04/2014   Generalized anxiety disorder 11/04/2014   Annual physical exam 11/04/2014   BMI 34.0-34.9,adult 11/04/2014   Controlled type 2 diabetes mellitus without complication, without long-term current use of insulin 03/21/2014   Abnormal transaminases 12/12/2012   REFERRING PROVIDER: Raliegh Ip, DO   REFERRING DIAG: Lumbar back pain with radiculopathy affecting left lower extremity   Rationale for Evaluation and Treatment: Rehabilitation  THERAPY DIAG:  Radiculopathy, lumbar region  ONSET DATE: August 2023   SUBJECTIVE:                                                                                                                                                                                            SUBJECTIVE STATEMENT: Patient reports she has minimal pain today as she has been of work for the past three days. But by the end of the 12 hour shift she is practically crawling to the car.  PERTINENT HISTORY:  Hypertension, diabetes, depression, anxiety, and current smoker  PAIN:  Are you having pain? Yes: NPRS scale: 2/10 Pain location: low back and left thigh Pain description: intermittent, burning Aggravating factors: working, standing, walking Relieving factors: sitting, medication  PRECAUTIONS: None  PATIENT GOALS: get a MRI, be able to dance, reduced pain, be able to get up off the  floor or bathtub, and avoid surgery  NEXT MD VISIT: none scheduled   OBJECTIVE:   PATIENT SURVEYS:  FOTO 39.24   SENSATION: Patient reports that she had intermittent numbness in her right leg, but has not experienced this in a while.   POSTURE: No Significant postural limitations  PALPATION: TTP: left TFL, IT band ("sore), hip flexors ("fire")  LUMBAR JOINT MOBILITY:  L1-2: WFL and nonpainful  L2-3: hypomobile and painful  L3-S1: WFL and nonpainful  LUMBAR ROM:   AROM eval  Flexion 54; reproduced pain with initiation of this motion  Extension 12  Right lateral flexion 25% limited   Left lateral flexion 25% limited; familiar pain  Right rotation 25% limitation; reproduced pain when returning to neutral  Left rotation 50% limited   (Blank rows = not tested)  LOWER EXTREMITY ROM: WFL for activities assessed  LOWER EXTREMITY MMT:    MMT Right eval Left eval  Hip flexion 4/5 4-/5  Hip extension    Hip abduction    Hip adduction    Hip internal rotation    Hip external rotation    Knee flexion 4+/5 4/5; slight pain  Knee extension 5/5 4+/5  Ankle dorsiflexion 4/5 4/5  Ankle plantarflexion    Ankle inversion    Ankle eversion     (Blank rows = not tested)  LUMBAR SPECIAL TESTS:  Straight leg raise test: Negative and FABER test:  Negative  FUNCTIONAL TESTS:  Rolling to the left: familiar pain  GAIT: Assistive device utilized: None Level of assistance: Complete Independence Comments: decreased gait   TODAY'S TREATMENT:                                                                                                                              DATE:                 4/18 EXERCISE LOG  Exercise Repetitions and Resistance Comments  Nustep L3 x15 min   SKTC 2x20 sec   Bridge X15 reps 3 sec holds   POE X2 min No LLE pain  Prone pressups X10 reps No LLE pain  PPT X10 reps 5 sec holds   PPT with SLR X10 reps    Blank cell = exercise not performed today   Modalities  Date: 08/04/22 Traction: lumbar, 75# max/15# min, 15 mins mins  PATIENT EDUCATION:  Education details: POC, healing, prognosis, and goals for therapy Person educated: Patient Education method: Explanation Education comprehension: verbalized understanding  HOME EXERCISE PROGRAM: Today's interventions were provided as a HEP. She declined a handout, but reported understanding.  ASSESSMENT:  CLINICAL IMPRESSION: Patient presented in clinic with reports of mild pain as she has not worked in the last several days. Patient reporting pain in L anterior quad upon arrival. Patient reported an increase in pain after standing from Nustep. Greater LE weakness noted in LLE than RLE and more multimodal cueing required to ensure proper PPT technique. Patient had  no exaggerated pain with extension based exercises. Mechanical lumbar traction initiated at 75# max. Patient initially sore after traction session which dissipated as she stood. Patient educated to monitor symptoms and that soreness may present.  OBJECTIVE IMPAIRMENTS: decreased activity tolerance, decreased mobility, difficulty walking, decreased ROM, decreased strength, hypomobility, impaired tone, and pain.   ACTIVITY LIMITATIONS: lifting, bending, standing, squatting, stairs, transfers, bed mobility,  dressing, locomotion level, and caring for others  PARTICIPATION LIMITATIONS: meal prep, cleaning, laundry, shopping, community activity, and occupation  PERSONAL FACTORS: Time since onset of injury/illness/exacerbation and 3+ comorbidities: Hypertension, diabetes, depression, anxiety, and current smoker  are also affecting patient's functional outcome.   REHAB POTENTIAL: Fair    CLINICAL DECISION MAKING: Evolving/moderate complexity  EVALUATION COMPLEXITY: Moderate  GOALS: Goals reviewed with patient? Yes  LONG TERM GOALS: Target date: 08/31/22  Patient will be independent with her HEP. Baseline:  Goal status: INITIAL  2.  Patient will report being able to complete her shift at work without her familiar pain exceeding 8/10. Baseline:  Goal status: INITIAL  3.  Patient will be able to navigate at least 6 steps with a reciprocal pattern for improved household independence. Baseline:  Goal status: INITIAL  4.  Patient will be able to transition between left side-lying to supine without reproducing her familiar pain. Baseline:  Goal status: INITIAL  PLAN:  PT FREQUENCY: 2x/week  PT DURATION: 4 weeks  PLANNED INTERVENTIONS: Therapeutic exercises, Therapeutic activity, Neuromuscular re-education, Balance training, Gait training, Patient/Family education, Self Care, Joint mobilization, Stair training, Dry Needling, Electrical stimulation, Spinal mobilization, Cryotherapy, Moist heat, Traction, Manual therapy, and Re-evaluation.  PLAN FOR NEXT SESSION: nustep, lumbar stabilization, lower extremity strengthening, manual therapy, and modalities as needed   Marvell Fuller, PTA 08/04/2022, 3:48 PM

## 2022-08-08 ENCOUNTER — Ambulatory Visit: Payer: 59

## 2022-08-09 ENCOUNTER — Ambulatory Visit: Payer: 59 | Admitting: Physical Therapy

## 2022-08-12 ENCOUNTER — Other Ambulatory Visit: Payer: Self-pay | Admitting: Pharmacist

## 2022-08-12 ENCOUNTER — Ambulatory Visit: Payer: 59 | Admitting: Physical Therapy

## 2022-08-12 ENCOUNTER — Encounter: Payer: Self-pay | Admitting: Physical Therapy

## 2022-08-12 DIAGNOSIS — M5416 Radiculopathy, lumbar region: Secondary | ICD-10-CM | POA: Diagnosis not present

## 2022-08-12 DIAGNOSIS — E119 Type 2 diabetes mellitus without complications: Secondary | ICD-10-CM

## 2022-08-12 NOTE — Progress Notes (Signed)
   08/12/2022 Name: [name] MRN: [id number] DOB: [birth year]   Assessment/Plan:   Diabetes: - Currently controlled - Reviewed long term cardiovascular and renal outcomes of uncontrolled blood sugar - Recommend to check glucose fasting daily or if symptomatic -Attempting to obtain Mounjaro 7.5mg  sq weekly (can titrate every 4 weeks as tolerated to MAX dose of 15mg  weekly--caution current backorders of 10-15mg  doses)   Insurance barriers; appeal submitted on 07/29/22; APPEAL DENIED  Denies personal and family history of Medullary thyroid cancer (MTC)  Will attempt to call in Ozempic to see if it is covered.  Insurance states they will pay for Januvia (sent in as back up)  [name], PharmD, BCACP Clinical Pharmacist, [facility]

## 2022-08-12 NOTE — Therapy (Signed)
OUTPATIENT PHYSICAL THERAPY THORACOLUMBAR TREATMENT   Patient Name: JANNEY PRIEGO MRN: [id number] DOB:[birth year], 57 y.o., female Today's Date: 08/12/2022  END OF SESSION:  PT End of Session - 08/12/22 1034     Visit Number 3    Number of Visits 8    Date for PT Re-Evaluation 09/16/22    PT Start Time 1033    PT Stop Time 1122    PT Time Calculation (min) 49 min    Activity Tolerance Patient tolerated treatment well    Behavior During Therapy WFL for tasks assessed/performed            Past Medical History:  Diagnosis Date   Anxiety    Diabetes mellitus without complication (HCC)    Elevated heart rate and blood pressure    Esophageal reflux    Migraines    Past Surgical History:  Procedure Laterality Date   LAPAROSCOPIC VAGINAL HYSTERECTOMY N/A 05 10 2012   Patient Active Problem List   Diagnosis Date Noted   Hyperlipidemia associated with type 2 diabetes mellitus (HCC) 09/25/2020   Obesity (BMI 30.0-34.9) 09/25/2020   Obstructive sleep apnea 09/29/2016   Abscess of breast 05/05/2016   Breast pain 07/21/2015   Trigger finger, acquired 05/07/2015   Gastroesophageal reflux disease without esophagitis 11/04/2014   Hypertension associated with diabetes (HCC) 11/04/2014   Depression 11/04/2014   Generalized anxiety disorder 11/04/2014   Annual physical exam 11/04/2014   BMI 34.0-34.9,adult 11/04/2014   Controlled type 2 diabetes mellitus without complication, without long-term current use of insulin (HCC) 03/21/2014   Abnormal transaminases 12/12/2012   REFERRING PROVIDER: Raliegh Ip, DO   REFERRING DIAG: Lumbar back pain with radiculopathy affecting left lower extremity   Rationale for Evaluation and Treatment: Rehabilitation  THERAPY DIAG:  Radiculopathy, lumbar region  ONSET DATE: August 2023   SUBJECTIVE:                                                                                                                                                                                            SUBJECTIVE STATEMENT: Has had the stomach virus for the past few days. Noticed after traction that she had more pain at night. States that pain used to be into L thigh but now more localized to lumbar/sacral region and L hip. Fears that pain is progressing. Had 10/10 LBP last night as she worked yesterday.  PERTINENT HISTORY:  Hypertension, diabetes, depression, anxiety, and current smoker  PAIN:  Are you having pain? Yes: NPRS scale: 5/10 Pain location: low back and left thigh Pain description: intermittent, burning Aggravating factors: working, standing, walking Relieving factors: sitting, medication  PRECAUTIONS: None  PATIENT GOALS: get a MRI, be able to dance, reduced pain, be able to get up off the floor or bathtub, and avoid surgery  NEXT MD VISIT: none scheduled   OBJECTIVE:   PATIENT SURVEYS:  FOTO 39.24   SENSATION: Patient reports that she had intermittent numbness in her right leg, but has not experienced this in a while.   POSTURE: No Significant postural limitations  PALPATION: TTP: left TFL, IT band ("sore), hip flexors ("fire")  LUMBAR JOINT MOBILITY:  L1-2: WFL and nonpainful  L2-3: hypomobile and painful  L3-S1: WFL and nonpainful  LUMBAR ROM:   AROM eval  Flexion 54; reproduced pain with initiation of this motion  Extension 12  Right lateral flexion 25% limited   Left lateral flexion 25% limited; familiar pain  Right rotation 25% limitation; reproduced pain when returning to neutral  Left rotation 50% limited   (Blank rows = not tested)  LOWER EXTREMITY ROM: WFL for activities assessed  LOWER EXTREMITY MMT:    MMT Right eval Left eval  Hip flexion 4/5 4-/5  Hip extension    Hip abduction    Hip adduction    Hip internal rotation    Hip external rotation    Knee flexion 4+/5 4/5; slight pain  Knee extension 5/5 4+/5  Ankle dorsiflexion 4/5 4/5  Ankle plantarflexion    Ankle inversion     Ankle eversion     (Blank rows = not tested)  LUMBAR SPECIAL TESTS:  Straight leg raise test: Negative and FABER test: Negative  FUNCTIONAL TESTS:  Rolling to the left: familiar pain  GAIT: Assistive device utilized: None Level of assistance: Complete Independence Comments: decreased gait   TODAY'S TREATMENT:                                                                                                                              DATE:                 4/26 EXERCISE LOG  Exercise Repetitions and Resistance Comments  Nustep L3 x16 min   SKTC 2x30 sec   Bridge X15 reps 3 sec holds Burn in L anterior thigh  LTR X5 reps 5 sec holds Anterior L thigh pain with L rotation               Blank cell = exercise not performed today   Modalities  Date: 08/12/22 Unattended Estim: Lumbar, IFC, 15 mins, Pain Hot Pack: Lumbar, 15 mins, Pain  PATIENT EDUCATION:  Education details: POC, healing, prognosis, and goals for therapy Person educated: Patient Education method: Explanation Education comprehension: verbalized understanding  HOME EXERCISE PROGRAM: Today's interventions were provided as a HEP. She declined a handout, but reported understanding.  ASSESSMENT:  CLINICAL IMPRESSION: Patient presented in clinic with reports of increased LBP and L hip pain. Patient indicated that L anterior thigh pain also present after work last night with rating of 10/10. Patient able  to tolerate light therex but with new LTR stretch greater L anterior thigh pain as well as burning with bridges. Patient reported greater pain while resting following traction session. Modalities switched to stim with moist heat to compare responses. Normal modalities response noted following removal of the modalities. No goal progress at this time secondary to continued high level pain.  OBJECTIVE IMPAIRMENTS: decreased activity tolerance, decreased mobility, difficulty walking, decreased ROM, decreased strength,  hypomobility, impaired tone, and pain.   ACTIVITY LIMITATIONS: lifting, bending, standing, squatting, stairs, transfers, bed mobility, dressing, locomotion level, and caring for others  PARTICIPATION LIMITATIONS: meal prep, cleaning, laundry, shopping, community activity, and occupation  PERSONAL FACTORS: Time since onset of injury/illness/exacerbation and 3+ comorbidities: Hypertension, diabetes, depression, anxiety, and current smoker  are also affecting patient's functional outcome.   REHAB POTENTIAL: Fair    CLINICAL DECISION MAKING: Evolving/moderate complexity  EVALUATION COMPLEXITY: Moderate  GOALS: Goals reviewed with patient? Yes  LONG TERM GOALS: Target date: 08/31/22  Patient will be independent with her HEP. Baseline:  Goal status: On-going  2.  Patient will report being able to complete her shift at work without her familiar pain exceeding 8/10. Baseline:  Goal status: On-going  3.  Patient will be able to navigate at least 6 steps with a reciprocal pattern for improved household independence. Baseline:  Goal status: On-going  4.  Patient will be able to transition between left side-lying to supine without reproducing her familiar pain. Baseline:  Goal status: On-going  PLAN:  PT FREQUENCY: 2x/week  PT DURATION: 4 weeks  PLANNED INTERVENTIONS: Therapeutic exercises, Therapeutic activity, Neuromuscular re-education, Balance training, Gait training, Patient/Family education, Self Care, Joint mobilization, Stair training, Dry Needling, Electrical stimulation, Spinal mobilization, Cryotherapy, Moist heat, Traction, Manual therapy, and Re-evaluation.  PLAN FOR NEXT SESSION: nustep, lumbar stabilization, lower extremity strengthening, manual therapy, and modalities as needed   Marvell Fuller, PTA 08/12/2022, 11:26 AM

## 2022-08-17 ENCOUNTER — Ambulatory Visit: Payer: 59 | Attending: Family Medicine | Admitting: Physical Therapy

## 2022-08-17 ENCOUNTER — Encounter: Payer: Self-pay | Admitting: Physical Therapy

## 2022-08-17 DIAGNOSIS — M5416 Radiculopathy, lumbar region: Secondary | ICD-10-CM | POA: Insufficient documentation

## 2022-08-17 NOTE — Therapy (Signed)
OUTPATIENT PHYSICAL THERAPY THORACOLUMBAR TREATMENT   Patient Name: [name] MRN: [id number] DOB:1965-05-10, 57 y.o., female Today's Date: 08/17/2022  END OF SESSION:  PT End of Session - 08/17/22 1349     Visit Number 4    Number of Visits 8    Date for PT Re-Evaluation 09/16/22    PT Start Time 1345    PT Stop Time 1434    PT Time Calculation (min) 49 min    Activity Tolerance Patient tolerated treatment well    Behavior During Therapy WFL for tasks assessed/performed            Past Medical History:  Diagnosis Date   Anxiety    Diabetes mellitus without complication (HCC)    Elevated heart rate and blood pressure    Esophageal reflux    Migraines    Past Surgical History:  Procedure Laterality Date   LAPAROSCOPIC VAGINAL HYSTERECTOMY N/A 05 10 2012   Patient Active Problem List   Diagnosis Date Noted   Hyperlipidemia associated with type 2 diabetes mellitus (HCC) 09/25/2020   Obesity (BMI 30.0-34.9) 09/25/2020   Obstructive sleep apnea 09/29/2016   Abscess of breast 05/05/2016   Breast pain 07/21/2015   Trigger finger, acquired 05/07/2015   Gastroesophageal reflux disease without esophagitis 11/04/2014   Hypertension associated with diabetes (HCC) 11/04/2014   Depression 11/04/2014   Generalized anxiety disorder 11/04/2014   Annual physical exam 11/04/2014   BMI 34.0-34.9,adult 11/04/2014   Controlled type 2 diabetes mellitus without complication, without long-term current use of insulin (HCC) 03/21/2014   Abnormal transaminases 12/12/2012   REFERRING PROVIDER: Raliegh Ip, DO   REFERRING DIAG: Lumbar back pain with radiculopathy affecting left lower extremity   Rationale for Evaluation and Treatment: Rehabilitation  THERAPY DIAG:  Radiculopathy, lumbar region  ONSET DATE: August 2023   SUBJECTIVE:                                                                                                                                                                                            SUBJECTIVE STATEMENT: Reports less pain after TENS session. Had a busy weekend and worked two long days back to back. Had pain start back yesterday end of 13 hr shift.  PERTINENT HISTORY:  Hypertension, diabetes, depression, anxiety, and current smoker  PAIN:  Are you having pain? Yes: NPRS scale: 2/10 Pain location: low back and left thigh Pain description: intermittent, burning Aggravating factors: working, standing, walking Relieving factors: sitting, medication  PRECAUTIONS: None  PATIENT GOALS: get a MRI, be able to dance, reduced pain, be able to get up off the floor or bathtub,  and avoid surgery  NEXT MD VISIT: none scheduled   OBJECTIVE:   PATIENT SURVEYS:  FOTO 39.24   SENSATION: Patient reports that she had intermittent numbness in her right leg, but has not experienced this in a while.   POSTURE: No Significant postural limitations  PALPATION: TTP: left TFL, IT band ("sore), hip flexors ("fire")  LUMBAR JOINT MOBILITY:  L1-2: WFL and nonpainful  L2-3: hypomobile and painful  L3-S1: WFL and nonpainful  LUMBAR ROM:   AROM eval  Flexion 54; reproduced pain with initiation of this motion  Extension 12  Right lateral flexion 25% limited   Left lateral flexion 25% limited; familiar pain  Right rotation 25% limitation; reproduced pain when returning to neutral  Left rotation 50% limited   (Blank rows = not tested)  LOWER EXTREMITY ROM: WFL for activities assessed  LOWER EXTREMITY MMT:    MMT Right eval Left eval  Hip flexion 4/5 4-/5  Hip extension    Hip abduction    Hip adduction    Hip internal rotation    Hip external rotation    Knee flexion 4+/5 4/5; slight pain  Knee extension 5/5 4+/5  Ankle dorsiflexion 4/5 4/5  Ankle plantarflexion    Ankle inversion    Ankle eversion     (Blank rows = not tested)  LUMBAR SPECIAL TESTS:  Straight leg raise test: Negative and FABER test:  Negative  FUNCTIONAL TESTS:  Rolling to the left: familiar pain  GAIT: Assistive device utilized: None Level of assistance: Complete Independence Comments: decreased gait   TODAY'S TREATMENT:                                                                                                                              DATE:                 5/1 EXERCISE LOG  Exercise Repetitions and Resistance Comments  Nustep L6 x17 min   PPT X10 reps 5 sec holds Tactile cue of towel  PPT with march X15 reps 3 sec holds alternating Tactile cue of towel  Bridge X20 reps 3 sec holds Burn in L anterior thigh  Clam Red theraband x10 reps Without towel                Blank cell = exercise not performed today   Modalities  Date: 08/17/22 Unattended Estim: Lumbar, IFC, 15 mins, Pain Hot Pack: Lumbar, 15 mins, Pain  PATIENT EDUCATION:  Education details: POC, healing, prognosis, and goals for therapy Person educated: Patient Education method: Explanation Education comprehension: verbalized understanding  HOME EXERCISE PROGRAM: Today's interventions were provided as a HEP. She declined a handout, but reported understanding.  ASSESSMENT:  CLINICAL IMPRESSION: Patient presented in clinic with reports of relief of symptoms for a few days after last PT session with TENS unit. Patient able to tolerate progression of PPT for core activation. Patient did not report any increased pain or LE burning  with therex session today. Normal modalities response noted following removal of the modalities.  OBJECTIVE IMPAIRMENTS: decreased activity tolerance, decreased mobility, difficulty walking, decreased ROM, decreased strength, hypomobility, impaired tone, and pain.   ACTIVITY LIMITATIONS: lifting, bending, standing, squatting, stairs, transfers, bed mobility, dressing, locomotion level, and caring for others  PARTICIPATION LIMITATIONS: meal prep, cleaning, laundry, shopping, community activity, and  occupation  PERSONAL FACTORS: Time since onset of injury/illness/exacerbation and 3+ comorbidities: Hypertension, diabetes, depression, anxiety, and current smoker  are also affecting patient's functional outcome.   REHAB POTENTIAL: Fair    CLINICAL DECISION MAKING: Evolving/moderate complexity  EVALUATION COMPLEXITY: Moderate  GOALS: Goals reviewed with patient? Yes  LONG TERM GOALS: Target date: 08/31/22  Patient will be independent with her HEP. Baseline:  Goal status: On-going  2.  Patient will report being able to complete her shift at work without her familiar pain exceeding 8/10. Baseline:  Goal status: On-going  3.  Patient will be able to navigate at least 6 steps with a reciprocal pattern for improved household independence. Baseline:  Goal status: On-going  4.  Patient will be able to transition between left side-lying to supine without reproducing her familiar pain. Baseline:  Goal status: On-going  PLAN:  PT FREQUENCY: 2x/week  PT DURATION: 4 weeks  PLANNED INTERVENTIONS: Therapeutic exercises, Therapeutic activity, Neuromuscular re-education, Balance training, Gait training, Patient/Family education, Self Care, Joint mobilization, Stair training, Dry Needling, Electrical stimulation, Spinal mobilization, Cryotherapy, Moist heat, Traction, Manual therapy, and Re-evaluation.  PLAN FOR NEXT SESSION: nustep, lumbar stabilization, lower extremity strengthening, manual therapy, and modalities as needed   Marvell Fuller, PTA 08/17/2022, 2:42 PM

## 2022-08-18 ENCOUNTER — Telehealth: Payer: Self-pay | Admitting: Family Medicine

## 2022-08-18 ENCOUNTER — Other Ambulatory Visit (HOSPITAL_COMMUNITY): Payer: Self-pay

## 2022-08-18 ENCOUNTER — Encounter: Payer: 59 | Admitting: Physical Therapy

## 2022-08-18 NOTE — Telephone Encounter (Signed)
Placed a call to [employer] at [phone]. Spoke with a representative to check the status of the prior authorization. Stated labs needed to be sent in for them to approve it.   Faxed labs to [phone] Attn: Dario Guardian

## 2022-08-18 NOTE — Telephone Encounter (Signed)
Insurance will not cover tirzepatide [facility]) 10 MG/0.5ML Pen pt does not have A1C over 7% before starting the rx. There are no labs indicating now that pt is over 7%. The last high A1c was 6.4. Januvia can be covered. Please call back

## 2022-08-19 ENCOUNTER — Other Ambulatory Visit: Payer: Self-pay | Admitting: Family Medicine

## 2022-08-19 ENCOUNTER — Telehealth: Payer: Self-pay | Admitting: Pharmacist

## 2022-08-19 MED ORDER — SEMAGLUTIDE (1 MG/DOSE) 4 MG/3ML ~~LOC~~ SOPN
1.0000 mg | PEN_INJECTOR | SUBCUTANEOUS | 3 refills | Status: DC
Start: 2022-08-19 — End: 2022-12-12

## 2022-08-19 MED ORDER — SITAGLIPTIN PHOSPHATE 50 MG PO TABS: 50.0000 mg | ORAL_TABLET | Freq: Every day | ORAL | 3 refills | Status: DC

## 2022-08-19 NOTE — Progress Notes (Signed)
Since ins not covering [name] sent

## 2022-08-19 NOTE — Telephone Encounter (Signed)
Perfect. Thank you!

## 2022-08-19 NOTE — Telephone Encounter (Signed)
Sent in Ozempic to see if it is covered Patient is currently on Ozempic samples

## 2022-08-19 NOTE — Telephone Encounter (Signed)
PA submitted Awaiting insurance

## 2022-08-22 ENCOUNTER — Ambulatory Visit: Payer: 59 | Admitting: Physical Therapy

## 2022-08-22 ENCOUNTER — Encounter: Payer: Self-pay | Admitting: Physical Therapy

## 2022-08-22 DIAGNOSIS — M5416 Radiculopathy, lumbar region: Secondary | ICD-10-CM | POA: Diagnosis not present

## 2022-08-22 NOTE — Therapy (Signed)
OUTPATIENT PHYSICAL THERAPY THORACOLUMBAR TREATMENT   Patient Name: JOVANA ILL MRN: [id number] DOB:January 23, 1966, 57 y.o., female Today's Date: 08/22/2022  END OF SESSION:  PT End of Session - 08/22/22 1346     Visit Number 5    Number of Visits 8    Date for PT Re-Evaluation 09/16/22    PT Start Time 1345    PT Stop Time 1425    PT Time Calculation (min) 40 min    Activity Tolerance Patient tolerated treatment well    Behavior During Therapy WFL for tasks assessed/performed            Past Medical History:  Diagnosis Date   Anxiety    Diabetes mellitus without complication (HCC)    Elevated heart rate and blood pressure    Esophageal reflux    Migraines    Past Surgical History:  Procedure Laterality Date   LAPAROSCOPIC VAGINAL HYSTERECTOMY N/A 05 10 2012   Patient Active Problem List   Diagnosis Date Noted   Hyperlipidemia associated with type 2 diabetes mellitus (HCC) 09/25/2020   Obesity (BMI 30.0-34.9) 09/25/2020   Obstructive sleep apnea 09/29/2016   Abscess of breast 05/05/2016   Breast pain 07/21/2015   Trigger finger, acquired 05/07/2015   Gastroesophageal reflux disease without esophagitis 11/04/2014   Hypertension associated with diabetes (HCC) 11/04/2014   Depression 11/04/2014   Generalized anxiety disorder 11/04/2014   Annual physical exam 11/04/2014   BMI 34.0-34.9,adult 11/04/2014   Controlled type 2 diabetes mellitus without complication, without long-term current use of insulin (HCC) 03/21/2014   Abnormal transaminases 12/12/2012   REFERRING PROVIDER: Raliegh Ip, DO   REFERRING DIAG: Lumbar back pain with radiculopathy affecting left lower extremity   Rationale for Evaluation and Treatment: Rehabilitation  THERAPY DIAG:  Radiculopathy, lumbar region  ONSET DATE: August 2023   SUBJECTIVE:                                                                                                                                                                                            SUBJECTIVE STATEMENT: Reports a little pain on arrival.  PERTINENT HISTORY:  Hypertension, diabetes, depression, anxiety, and current smoker  PAIN:  Are you having pain? Yes: NPRS scale: 4/10 Pain location: low back and left thigh Pain description: intermittent, burning Aggravating factors: working, standing, walking Relieving factors: sitting, medication  PRECAUTIONS: None  PATIENT GOALS: get a MRI, be able to dance, reduced pain, be able to get up off the floor or bathtub, and avoid surgery  NEXT MD VISIT: none scheduled   OBJECTIVE:   PATIENT SURVEYS:  FOTO 39.24   SENSATION:  Patient reports that she had intermittent numbness in her right leg, but has not experienced this in a while.   POSTURE: No Significant postural limitations  PALPATION: TTP: left TFL, IT band ("sore), hip flexors ("fire")  LUMBAR JOINT MOBILITY:  L1-2: WFL and nonpainful  L2-3: hypomobile and painful  L3-S1: WFL and nonpainful  LUMBAR ROM:   AROM eval  Flexion 54; reproduced pain with initiation of this motion  Extension 12  Right lateral flexion 25% limited   Left lateral flexion 25% limited; familiar pain  Right rotation 25% limitation; reproduced pain when returning to neutral  Left rotation 50% limited   (Blank rows = not tested)  LOWER EXTREMITY ROM: WFL for activities assessed  LOWER EXTREMITY MMT:    MMT Right eval Left eval  Hip flexion 4/5 4-/5  Hip extension    Hip abduction    Hip adduction    Hip internal rotation    Hip external rotation    Knee flexion 4+/5 4/5; slight pain  Knee extension 5/5 4+/5  Ankle dorsiflexion 4/5 4/5  Ankle plantarflexion    Ankle inversion    Ankle eversion     (Blank rows = not tested)  LUMBAR SPECIAL TESTS:  Straight leg raise test: Negative and FABER test: Negative  FUNCTIONAL TESTS:  Rolling to the left: familiar pain  GAIT: Assistive device utilized: None Level of  assistance: Complete Independence Comments: decreased gait   TODAY'S TREATMENT:                                                                                                                              DATE:                 5/6 EXERCISE LOG  Exercise Repetitions and Resistance Comments  Nustep L4 x15 min   PPT X10 reps 5 sec holds   PPT with march X15 reps 3 sec holds alternating   Bridge with clam X20 reps 5 sec holds                    Blank cell = exercise not performed today   Modalities  Date: 08/22/22 Unattended Estim: Lumbar, IFC, 10 mins, Pain Hot Pack: Lumbar, 10 mins, Pain  PATIENT EDUCATION:  Education details: POC, healing, prognosis, and goals for therapy Person educated: Patient Education method: Explanation Education comprehension: verbalized understanding  HOME EXERCISE PROGRAM: Today's interventions were provided as a HEP. She declined a handout, but reported understanding.  ASSESSMENT:  CLINICAL IMPRESSION: Patient presented in clinic with rmin-mod LBP. Patient progressed with PPT activities with no towel for tactile cueing. Patient also progressed to resistance bands with [facility] strengthening. No increased LBP reported during therex. Normal modalities response noted following removal of the modalities.  OBJECTIVE IMPAIRMENTS: decreased activity tolerance, decreased mobility, difficulty walking, decreased ROM, decreased strength, hypomobility, impaired tone, and pain.   ACTIVITY LIMITATIONS: lifting, bending, standing, squatting, stairs, transfers, bed mobility, dressing, locomotion level,  and caring for others  PARTICIPATION LIMITATIONS: meal prep, cleaning, laundry, shopping, community activity, and occupation  PERSONAL FACTORS: Time since onset of injury/illness/exacerbation and 3+ comorbidities: Hypertension, diabetes, depression, anxiety, and current smoker  are also affecting patient's functional outcome.   REHAB POTENTIAL: Fair    CLINICAL  DECISION MAKING: Evolving/moderate complexity  EVALUATION COMPLEXITY: Moderate  GOALS: Goals reviewed with patient? Yes  LONG TERM GOALS: Target date: 08/31/22  Patient will be independent with her HEP. Baseline:  Goal status: On-going  2.  Patient will report being able to complete her shift at work without her familiar pain exceeding 8/10. Baseline:  Goal status: On-going  3.  Patient will be able to navigate at least 6 steps with a reciprocal pattern for improved household independence. Baseline:  Goal status: On-going  4.  Patient will be able to transition between left side-lying to supine without reproducing her familiar pain. Baseline:  Goal status: On-going  PLAN:  PT FREQUENCY: 2x/week  PT DURATION: 4 weeks  PLANNED INTERVENTIONS: Therapeutic exercises, Therapeutic activity, Neuromuscular re-education, Balance training, Gait training, Patient/Family education, Self Care, Joint mobilization, Stair training, Dry Needling, Electrical stimulation, Spinal mobilization, Cryotherapy, Moist heat, Traction, Manual therapy, and Re-evaluation.  PLAN FOR NEXT SESSION: nustep, lumbar stabilization, lower extremity strengthening, manual therapy, and modalities as needed   Marvell Fuller, PTA 08/22/2022, 2:26 PM

## 2022-08-23 NOTE — Telephone Encounter (Signed)
Per another encounter, [name] is not covered.

## 2022-08-25 NOTE — Telephone Encounter (Signed)
Additional information required for [place] ozempic Will fax additional information

## 2022-08-26 ENCOUNTER — Encounter: Payer: 59 | Admitting: Physical Therapy

## 2022-08-30 NOTE — Telephone Encounter (Signed)
Know anything about this?

## 2022-08-30 NOTE — Telephone Encounter (Signed)
Pharmacy Patient Advocate Encounter  Received notification from [facility] that the request for prior authorization for Ozempic has been denied due to provider did not submit enough information for clinical review.      Please be advised we currently do not have a Pharmacist to review denials, therefore you will need to process appeals accordingly as needed. Thanks for your support at this time.   You may call [phone] or fax [phone], for more information.

## 2022-08-31 ENCOUNTER — Ambulatory Visit: Payer: 59 | Admitting: Physical Therapy

## 2022-08-31 ENCOUNTER — Other Ambulatory Visit: Payer: Self-pay | Admitting: Family Medicine

## 2022-08-31 ENCOUNTER — Encounter: Payer: Self-pay | Admitting: Physical Therapy

## 2022-08-31 DIAGNOSIS — M5416 Radiculopathy, lumbar region: Secondary | ICD-10-CM | POA: Diagnosis not present

## 2022-08-31 NOTE — Therapy (Signed)
OUTPATIENT PHYSICAL THERAPY THORACOLUMBAR TREATMENT   Patient Name: [name] MRN: [id number] DOB:15-Sep-1965, 57 y.o., female Today's Date: 08/31/2022  END OF SESSION:  PT End of Session - 08/31/22 1352     Visit Number 6    Number of Visits 8    Date for PT Re-Evaluation 09/16/22    PT Start Time 1349    PT Stop Time 1431    PT Time Calculation (min) 42 min    Activity Tolerance Patient tolerated treatment well    Behavior During Therapy WFL for tasks assessed/performed            Past Medical History:  Diagnosis Date   Anxiety    Diabetes mellitus without complication (HCC)    Elevated heart rate and blood pressure    Esophageal reflux    Migraines    Past Surgical History:  Procedure Laterality Date   LAPAROSCOPIC VAGINAL HYSTERECTOMY N/A 05 10 2012   Patient Active Problem List   Diagnosis Date Noted   Hyperlipidemia associated with type 2 diabetes mellitus (HCC) 09/25/2020   Obesity (BMI 30.0-34.9) 09/25/2020   Obstructive sleep apnea 09/29/2016   Abscess of breast 05/05/2016   Breast pain 07/21/2015   Trigger finger, acquired 05/07/2015   Gastroesophageal reflux disease without esophagitis 11/04/2014   Hypertension associated with diabetes (HCC) 11/04/2014   Depression 11/04/2014   Generalized anxiety disorder 11/04/2014   Annual physical exam 11/04/2014   BMI 34.0-34.9,adult 11/04/2014   Controlled type 2 diabetes mellitus without complication, without long-term current use of insulin (HCC) 03/21/2014   Abnormal transaminases 12/12/2012   REFERRING PROVIDER: Raliegh Ip, DO   REFERRING DIAG: Lumbar back pain with radiculopathy affecting left lower extremity   Rationale for Evaluation and Treatment: Rehabilitation  THERAPY DIAG:  Radiculopathy, lumbar region  ONSET DATE: August 2023   SUBJECTIVE:                                                                                                                                                                                            SUBJECTIVE STATEMENT: Reports a little pain on arrival as she worked last night.  PERTINENT HISTORY:  Hypertension, diabetes, depression, anxiety, and current smoker  PAIN:  Are you having pain? Yes: NPRS scale: 4/10 Pain location: low back and left thigh Pain description: intermittent, burning Aggravating factors: working, standing, walking Relieving factors: sitting, medication  PRECAUTIONS: None  PATIENT GOALS: get a MRI, be able to dance, reduced pain, be able to get up off the floor or bathtub, and avoid surgery  NEXT MD VISIT: none scheduled   OBJECTIVE:   PATIENT SURVEYS:  FOTO 39.24   SENSATION: Patient reports that she had intermittent numbness in her right leg, but has not experienced this in a while.   POSTURE: No Significant postural limitations  PALPATION: TTP: left TFL, IT band ("sore), hip flexors ("fire")  LUMBAR JOINT MOBILITY:  L1-2: WFL and nonpainful  L2-3: hypomobile and painful  L3-S1: WFL and nonpainful  LUMBAR ROM:   AROM eval  Flexion 54; reproduced pain with initiation of this motion  Extension 12  Right lateral flexion 25% limited   Left lateral flexion 25% limited; familiar pain  Right rotation 25% limitation; reproduced pain when returning to neutral  Left rotation 50% limited   (Blank rows = not tested)  LOWER EXTREMITY ROM: WFL for activities assessed  LOWER EXTREMITY MMT:    MMT Right eval Left eval  Hip flexion 4/5 4-/5  Hip extension    Hip abduction    Hip adduction    Hip internal rotation    Hip external rotation    Knee flexion 4+/5 4/5; slight pain  Knee extension 5/5 4+/5  Ankle dorsiflexion 4/5 4/5  Ankle plantarflexion    Ankle inversion    Ankle eversion     (Blank rows = not tested)  LUMBAR SPECIAL TESTS:  Straight leg raise test: Negative and FABER test: Negative  FUNCTIONAL TESTS:  Rolling to the left: familiar pain  GAIT: Assistive device  utilized: None Level of assistance: Complete Independence Comments: decreased gait   TODAY'S TREATMENT:                                                                                                                              DATE:                 5/15 EXERCISE LOG  Exercise Repetitions and Resistance Comments  Nustep L4 x15 min   Mod plank leg raise X10 reps 3 sec holds   Mod plank arm raise X10 reps 3 sec holds   Shoulder extension with core Blue XTS x15 reps   [employer] press with march X10 reps    Bridge with clam Red theraband x15 reps 3 sec holds            Blank cell = exercise not performed today   Modalities  Date: 08/31/22 Unattended Estim: Lumbar, IFC, 10 mins, Pain Hot Pack: Lumbar, 10 mins, Pain  PATIENT EDUCATION:  Education details: POC, healing, prognosis, and goals for therapy Person educated: Patient Education method: Explanation Education comprehension: verbalized understanding  HOME EXERCISE PROGRAM: Today's interventions were provided as a HEP. She declined a handout, but reported understanding.  ASSESSMENT:  CLINICAL IMPRESSION: Patient presented in clinic with continued moderate LBP. Pain is not worsening but patient doesn't feel relief. Patient progressed to stabilization and standing therex with core progression. Patient denied any increased pain during therex session. Normal modalities response noted following removal of the modalities.  OBJECTIVE IMPAIRMENTS: decreased activity tolerance, decreased mobility, difficulty walking, decreased  ROM, decreased strength, hypomobility, impaired tone, and pain.   ACTIVITY LIMITATIONS: lifting, bending, standing, squatting, stairs, transfers, bed mobility, dressing, locomotion level, and caring for others  PARTICIPATION LIMITATIONS: meal prep, cleaning, laundry, shopping, community activity, and occupation  PERSONAL FACTORS: Time since onset of injury/illness/exacerbation and 3+ comorbidities: Hypertension,  diabetes, depression, anxiety, and current smoker  are also affecting patient's functional outcome.   REHAB POTENTIAL: Fair    CLINICAL DECISION MAKING: Evolving/moderate complexity  EVALUATION COMPLEXITY: Moderate  GOALS: Goals reviewed with patient? Yes  LONG TERM GOALS: Target date: 08/31/22  Patient will be independent with her HEP. Baseline:  Goal status: On-going  2.  Patient will report being able to complete her shift at work without her familiar pain exceeding 8/10. Baseline:  Goal status: On-going  3.  Patient will be able to navigate at least 6 steps with a reciprocal pattern for improved household independence. Baseline:  Goal status: On-going  4.  Patient will be able to transition between left side-lying to supine without reproducing her familiar pain. Baseline:  Goal status: On-going  PLAN:  PT FREQUENCY: 2x/week  PT DURATION: 4 weeks  PLANNED INTERVENTIONS: Therapeutic exercises, Therapeutic activity, Neuromuscular re-education, Balance training, Gait training, Patient/Family education, Self Care, Joint mobilization, Stair training, Dry Needling, Electrical stimulation, Spinal mobilization, Cryotherapy, Moist heat, Traction, Manual therapy, and Re-evaluation.  PLAN FOR NEXT SESSION: nustep, lumbar stabilization, lower extremity strengthening, manual therapy, and modalities as needed   Marvell Fuller, PTA 08/31/2022, 2:55 PM

## 2022-08-31 NOTE — Telephone Encounter (Signed)
-  Insurance insisting patient try Januvia (try/fail first) --tried to get approved x2 & they aren't budging  -I left another Ozempic sample up front in fridge to bridge -Will route to PCP -Likely need re-call in Januvia--patient's A1c is controlled; I'm doubtful insurance will cover GLP1 unless patient's A1c goes up or Januvia is somehow intolerable

## 2022-08-31 NOTE — Progress Notes (Unsigned)
Toni Amend, can you see if we can get that MRI resent?  She has completed PT and still having issues.  Also Dr Noel Gerold isn't part of the group her referral was sent to.  [facility] [place].  Do I need to put in a new referral or can we just redirect the March one?

## 2022-08-31 NOTE — Telephone Encounter (Signed)
Ill let the patient know

## 2022-09-01 ENCOUNTER — Encounter: Payer: Self-pay | Admitting: Physical Therapy

## 2022-09-01 ENCOUNTER — Ambulatory Visit: Payer: 59 | Admitting: Physical Therapy

## 2022-09-01 DIAGNOSIS — M5416 Radiculopathy, lumbar region: Secondary | ICD-10-CM | POA: Diagnosis not present

## 2022-09-01 NOTE — Therapy (Signed)
OUTPATIENT PHYSICAL THERAPY THORACOLUMBAR TREATMENT   Patient Name: [name] MRN: [id number] DOB:11-05-1965, 57 y.o., female Today's Date: 09/01/2022  END OF SESSION:  PT End of Session - 09/01/22 1337     Visit Number 7    Number of Visits 8    Date for PT Re-Evaluation 09/16/22    PT Start Time 1345    PT Stop Time 1431    PT Time Calculation (min) 46 min    Activity Tolerance Patient tolerated treatment well    Behavior During Therapy WFL for tasks assessed/performed            Past Medical History:  Diagnosis Date   Anxiety    Diabetes mellitus without complication (HCC)    Elevated heart rate and blood pressure    Esophageal reflux    Migraines    Past Surgical History:  Procedure Laterality Date   LAPAROSCOPIC VAGINAL HYSTERECTOMY N/A 05 10 2012   Patient Active Problem List   Diagnosis Date Noted   Hyperlipidemia associated with type 2 diabetes mellitus (HCC) 09/25/2020   Obesity (BMI 30.0-34.9) 09/25/2020   Obstructive sleep apnea 09/29/2016   Abscess of breast 05/05/2016   Breast pain 07/21/2015   Trigger finger, acquired 05/07/2015   Gastroesophageal reflux disease without esophagitis 11/04/2014   Hypertension associated with diabetes (HCC) 11/04/2014   Depression 11/04/2014   Generalized anxiety disorder 11/04/2014   Annual physical exam 11/04/2014   BMI 34.0-34.9,adult 11/04/2014   Controlled type 2 diabetes mellitus without complication, without long-term current use of insulin (HCC) 03/21/2014   Abnormal transaminases 12/12/2012   REFERRING PROVIDER: Raliegh Ip, DO   REFERRING DIAG: Lumbar back pain with radiculopathy affecting left lower extremity   Rationale for Evaluation and Treatment: Rehabilitation  THERAPY DIAG:  Radiculopathy, lumbar region  ONSET DATE: August 2023   SUBJECTIVE:                                                                                                                                                                                            SUBJECTIVE STATEMENT: Was a little sore after last treatment but to DC today in hopes of MRI approval.   PERTINENT HISTORY:  Hypertension, diabetes, depression, anxiety, and current smoker  PAIN:  Are you having pain? Yes: NPRS scale: 2-3/10 Pain location: low back and left thigh Pain description: intermittent, burning Aggravating factors: working, standing, walking Relieving factors: sitting, medication  PRECAUTIONS: None  PATIENT GOALS: get a MRI, be able to dance, reduced pain, be able to get up off the floor or bathtub, and avoid surgery  NEXT MD VISIT: none scheduled  OBJECTIVE:   PATIENT SURVEYS:  FOTO 50   SENSATION: Patient reports that she had intermittent numbness in her right leg, but has not experienced this in a while.  Update: no numbness in LLE but does have pain and burning as of 09/01/2022.  POSTURE: No Significant postural limitations  PALPATION: TTP: left TFL, IT band ("sore), hip flexors ("fire")  LUMBAR JOINT MOBILITY:  L1-2: WFL and nonpainful  L2-3: hypomobile and painful  L3-S1: WFL and nonpainful  LUMBAR ROM:   AROM eval  Flexion 54; reproduced pain with initiation of this motion  Extension 12  Right lateral flexion 25% limited   Left lateral flexion 25% limited; familiar pain  Right rotation 25% limitation; reproduced pain when returning to neutral  Left rotation 50% limited   (Blank rows = not tested)  LOWER EXTREMITY ROM: WFL for activities assessed  LOWER EXTREMITY MMT:    MMT Right eval Left eval  Hip flexion 4/5 4-/5  Hip extension    Hip abduction    Hip adduction    Hip internal rotation    Hip external rotation    Knee flexion 4+/5 4/5; slight pain  Knee extension 5/5 4+/5  Ankle dorsiflexion 4/5 4/5  Ankle plantarflexion    Ankle inversion    Ankle eversion     (Blank rows = not tested)  LUMBAR SPECIAL TESTS:  Straight leg raise test: Negative and FABER test:  Negative  FUNCTIONAL TESTS:  Rolling to the left: familiar pain  GAIT: Assistive device utilized: None Level of assistance: Complete Independence Comments: decreased gait   TODAY'S TREATMENT:                                                                                                                              DATE:       5/16 EXERCISE LOG  Exercise Repetitions and Resistance Comments  Nustep L4 x15 min   Mod plank leg raise X10 reps 3 sec holds Pain with avoid trunk rotation  Mod plank arm raise X10 reps 3 sec holds   Lumbar extension 50# x25 reps   B oblique press in standing X10 reps 5 sec holds each                Blank cell = exercise not performed today   Modalities  Date: 09/01/22 Unattended Estim: Lumbar, IFC, 10 mins, Pain Hot Pack: Lumbar, 10 mins, Pain  PATIENT EDUCATION:  Education details: POC, healing, prognosis, and goals for therapy Person educated: Patient Education method: Explanation Education comprehension: verbalized understanding  HOME EXERCISE PROGRAM: Today's interventions were provided as a HEP. She declined a handout, but reported understanding.  ASSESSMENT:  CLINICAL IMPRESSION: Patient has completed seven total visits of PT. Patient still experiencing greater amounts of pain during work and after work. Patient unable to tolerate reciprical gait on stairs currently and using UE support. PT has tried to be conservative with total visits due to insurance visit limitations. Patient  has been progressed through [facility] strengthening with pain still when patient is working 12 hour shifts or following her shifts. Patient denies having to lift very much at work but by the end of her shift she can hardly move. Patient does indicate mild hip soreness following yesterday's PT progression. Normal modalities response noted following removal of the modalities.  OBJECTIVE IMPAIRMENTS: decreased activity tolerance, decreased mobility, difficulty walking,  decreased ROM, decreased strength, hypomobility, impaired tone, and pain.   ACTIVITY LIMITATIONS: lifting, bending, standing, squatting, stairs, transfers, bed mobility, dressing, locomotion level, and caring for others  PARTICIPATION LIMITATIONS: meal prep, cleaning, laundry, shopping, community activity, and occupation  PERSONAL FACTORS: Time since onset of injury/illness/exacerbation and 3+ comorbidities: Hypertension, diabetes, depression, anxiety, and current smoker  are also affecting patient's functional outcome.   REHAB POTENTIAL: Fair    CLINICAL DECISION MAKING: Evolving/moderate complexity  EVALUATION COMPLEXITY: Moderate  GOALS: Goals reviewed with patient? Yes  LONG TERM GOALS: Target date: 08/31/22  Patient will be independent with her HEP. Baseline:  Goal status: MET  2.  Patient will report being able to complete her shift at work without her familiar pain exceeding 8/10. Baseline:  Goal status: NOT MET  3.  Patient will be able to navigate at least 6 steps with a reciprocal pattern for improved household independence. Baseline:  Goal status: NOT MET  4.  Patient will be able to transition between left side-lying to supine without reproducing her familiar pain. Baseline:  Goal status: MET  PLAN:  PT FREQUENCY: 2x/week  PT DURATION: 4 weeks  PLANNED INTERVENTIONS: Therapeutic exercises, Therapeutic activity, Neuromuscular re-education, Balance training, Gait training, Patient/Family education, Self Care, Joint mobilization, Stair training, Dry Needling, Electrical stimulation, Spinal mobilization, Cryotherapy, Moist heat, Traction, Manual therapy, and Re-evaluation.  PLAN FOR NEXT SESSION: DC  Marvell Fuller, PTA 09/01/2022, 2:33 PM

## 2022-09-08 ENCOUNTER — Other Ambulatory Visit: Payer: Self-pay | Admitting: Family

## 2022-09-08 MED ORDER — LISINOPRIL 40 MG PO TABS: 40.0000 mg | ORAL_TABLET | Freq: Every day | ORAL | 3 refills | Status: DC

## 2022-09-08 NOTE — Progress Notes (Signed)
PT BP today is 155/94, however, reports last night at work her BP was 202/120 then took an extra Lisinopril and decreased to 192/118.  Her father found a mass on his neck and has had increased stress.   Jannifer Rodney, FNP

## 2022-09-16 ENCOUNTER — Telehealth: Payer: Self-pay | Admitting: Family Medicine

## 2022-09-16 NOTE — Telephone Encounter (Signed)
Toni Amend is already working on this

## 2022-09-16 NOTE — Telephone Encounter (Signed)
[name] called stating that she has spoken with them regarding her MRI and wanting it done at [facility] in [place]. Needs [place] to fax them pts MRI order.   Their fax # is [phone]

## 2022-09-19 ENCOUNTER — Telehealth: Payer: Self-pay | Admitting: Family Medicine

## 2022-09-19 NOTE — Telephone Encounter (Signed)
Faxed

## 2022-09-22 ENCOUNTER — Encounter: Payer: Self-pay | Admitting: Family Medicine

## 2022-09-22 MED ORDER — AMLODIPINE BESYLATE 5 MG PO TABS: 5.0000 mg | ORAL_TABLET | Freq: Every day | ORAL | 3 refills | Status: DC

## 2022-09-23 ENCOUNTER — Ambulatory Visit (HOSPITAL_BASED_OUTPATIENT_CLINIC_OR_DEPARTMENT_OTHER): Payer: 59

## 2022-09-23 ENCOUNTER — Ambulatory Visit: Payer: Managed Care, Other (non HMO) | Admitting: Family Medicine

## 2022-09-26 MED ORDER — AMLODIPINE BESYLATE 5 MG PO TABS: 5.0000 mg | ORAL_TABLET | Freq: Every day | ORAL | 3 refills | Status: DC

## 2022-09-26 NOTE — Addendum Note (Signed)
Addended by: Raliegh Ip on: 09/26/2022 07:42 AM   Modules accepted: Orders

## 2022-09-28 ENCOUNTER — Encounter: Payer: Self-pay | Admitting: Family Medicine

## 2022-09-28 ENCOUNTER — Ambulatory Visit: Payer: Managed Care, Other (non HMO) | Admitting: Family Medicine

## 2022-09-28 VITALS — BP 146/98 | HR 94 | Temp 98.4°F | Ht 62.0 in | Wt 185.0 lb

## 2022-09-28 DIAGNOSIS — Z7985 Long-term (current) use of injectable non-insulin antidiabetic drugs: Secondary | ICD-10-CM | POA: Diagnosis not present

## 2022-09-28 DIAGNOSIS — E1159 Type 2 diabetes mellitus with other circulatory complications: Secondary | ICD-10-CM | POA: Diagnosis not present

## 2022-09-28 DIAGNOSIS — I152 Hypertension secondary to endocrine disorders: Secondary | ICD-10-CM | POA: Diagnosis not present

## 2022-09-28 MED ORDER — AMLODIPINE BESYLATE 10 MG PO TABS
10.0000 mg | ORAL_TABLET | Freq: Every day | ORAL | 3 refills | Status: DC
Start: 2022-09-28 — End: 2023-09-26

## 2022-09-28 NOTE — Progress Notes (Signed)
Subjective: CC: Hypertension PCP: Raliegh Ip, DO OZH:YQMV C [name] is a 57 y.o. female presenting to clinic today for:  1.  Hypertension Patient reports that blood pressures have been as high as 200/100s at work.  She reports occasional right-sided headache.  Denies any unilateral weakness, sensory changes, balance issues.  No shortness of breath, chest pain or edema.  She has totally quit smoking since this started last week.  She worries about stroke as this is what her husband died from.  She initially thought that symptoms were related to anxiety surrounding the illness of her father but he was found to have a clean bill of health.  However, her blood pressure never normalized.  She has been taking amlodipine 5 mg, metoprolol 25 mg extended release and lisinopril 40 mg daily with no control of blood pressure.  Control of blood pressure.  She denies excessive salt intake.  She does not exercise.   ROS: Per HPI  Allergies  Allergen Reactions   Penicillins Anaphylaxis   Sulfa Antibiotics Rash   Past Medical History:  Diagnosis Date   Anxiety    Diabetes mellitus without complication (HCC)    Elevated heart rate and blood pressure    Esophageal reflux    Migraines     Current Outpatient Medications:    amLODipine (NORVASC) 5 MG tablet, Take 1 tablet (5 mg total) by mouth daily., Disp: 90 tablet, Rfl: 3   Blood Glucose Monitoring Suppl DEVI, May substitute to any manufacturer covered by patient's insurance., Disp: 1 each, Rfl: 0   gabapentin (NEURONTIN) 300 MG capsule, Take 1 capsule (300 mg total) by mouth at bedtime for 7 days, THEN 1 capsule (300 mg total) 2 (two) times daily for 7 days, THEN 1 capsule (300 mg total) 3 (three) times daily., Disp: 270 capsule, Rfl: 3   Glucose Blood (BLOOD GLUCOSE TEST STRIPS) STRP, Check BGs daily. E11.9. May substitute to any manufacturer covered by patient's insurance., Disp: 100 strip, Rfl: PRN   Lancet Device MISC, Check BGs  daily. E11.9. May substitute to any manufacturer covered by patient's insurance., Disp: 1 each, Rfl: 0   Lancets Misc. MISC, Check BGs daily. E11.9 May substitute to any manufacturer covered by patient's insurance., Disp: 100 each, Rfl: PRN   levothyroxine (SYNTHROID) 25 MCG tablet, Take 1 tablet by mouth daily., Disp: 90 tablet, Rfl: 3   lisinopril (ZESTRIL) 40 MG tablet, Take 1 tablet (40 mg total) by mouth daily., Disp: 90 tablet, Rfl: 3   LORazepam (ATIVAN) 0.5 MG tablet, Take 0.5 mg by mouth daily as needed for anxiety., Disp: , Rfl:    metoprolol succinate (TOPROL-XL) 25 MG 24 hr tablet, Take 1 tablet (25 mg total) by mouth daily., Disp: 90 tablet, Rfl: 3   rosuvastatin (CRESTOR) 5 MG tablet, Take 1 tablet (5 mg total) by mouth every other day., Disp: 90 tablet, Rfl: 3   Semaglutide, 1 MG/DOSE, 4 MG/3ML SOPN, Inject 1 mg as directed once a week., Disp: 3 mL, Rfl: 3   sertraline (ZOLOFT) 100 MG tablet, TAKE 1 AND 1/2 TABLETS BY MOUTH DAILY., Disp: 135 tablet, Rfl: 3   Vitamin D, Ergocalciferol, (DRISDOL) 1.25 MG (50000 UNIT) CAPS capsule, Take 1 capsule (50,000 Units total) by mouth every 7 (seven) days., Disp: 12 capsule, Rfl: 3   omeprazole (PRILOSEC) 20 MG capsule, TAKE 1 CAPSULE BY MOUTH TWICE DAILY BEFORE A MEAL (Patient taking differently: 20 mg daily.), Disp: 180 capsule, Rfl: 3 Social History   Socioeconomic History  Marital status: Widowed    Spouse name: Not on file   Number of children: Not on file   Years of education: Not on file   Highest education level: Not on file  Occupational History   Not on file  Tobacco Use   Smoking status: Every Day   Smokeless tobacco: Never  Vaping Use   Vaping Use: Never used  Substance and Sexual Activity   Alcohol use: Yes    Alcohol/week: 2.0 standard drinks of alcohol    Types: 2 Standard drinks or equivalent per week   Drug use: No   Sexual activity: Yes  Other Topics Concern   Not on file  Social History Narrative   Not on  file   Social Determinants of Health   Financial Resource Strain: Not on file  Food Insecurity: Not on file  Transportation Needs: Not on file  Physical Activity: Not on file  Stress: Not on file  Social Connections: Not on file  Intimate Partner Violence: Not on file   Family History  Problem Relation Age of Onset   Breast cancer Mother    Cancer Mother        breast   Hypertension Mother    Diabetes Father    Diabetes Paternal Uncle    Hypertension Daughter    Hyperlipidemia Daughter     Objective: Office vital signs reviewed. BP (!) 146/98   Pulse 94   Temp 98.4 F (36.9 C)   Ht 5\' 2"  (1.575 m)   Wt 185 lb (83.9 kg)   SpO2 96%   BMI 33.84 kg/m   Physical Examination:  General: Awake, alert, well nourished, No acute distress HEENT: sclera white, MMM Cardio: regular rate and rhythm, S1S2 heard, no murmurs appreciated Pulm: clear to auscultation bilaterally, no wheezes, rhonchi or rales; normal work of breathing on room air Neuro: Cranial nerves II through XII grossly intact.  No focal neurologic deficits.  Negative Romberg  Assessment/ Plan: 57 y.o. female   Hypertension associated with diabetes (HCC) - Plan: amLODipine (NORVASC) 10 MG tablet  Blood pressure remains uncontrolled.  We discussed how to monitor blood pressure appropriately at home.  She will increase her amlodipine to 10 mg daily and continue other antihypertensives as prescribed.  Will plan to reassess in 2 weeks, sooner if concerns arise.  In the interim I will reduce her to 8-hour shifts since blood pressure height seems to be most prominent at work.   No orders of the defined types were placed in this encounter.  No orders of the defined types were placed in this encounter.    Raliegh Ip, DO [facility] [phone]

## 2022-10-12 ENCOUNTER — Ambulatory Visit (INDEPENDENT_AMBULATORY_CARE_PROVIDER_SITE_OTHER): Payer: Managed Care, Other (non HMO) | Admitting: Family Medicine

## 2022-10-12 ENCOUNTER — Encounter: Payer: Self-pay | Admitting: Family Medicine

## 2022-10-12 VITALS — BP 137/89 | HR 104 | Temp 97.8°F | Resp 20 | Ht 62.0 in | Wt 187.5 lb

## 2022-10-12 DIAGNOSIS — E1159 Type 2 diabetes mellitus with other circulatory complications: Secondary | ICD-10-CM | POA: Diagnosis not present

## 2022-10-12 DIAGNOSIS — I152 Hypertension secondary to endocrine disorders: Secondary | ICD-10-CM | POA: Diagnosis not present

## 2022-10-12 NOTE — Progress Notes (Signed)
Subjective: CC: Hypertension follow-up PCP: Raliegh Ip, DO [name] is a 57 y.o. female presenting to clinic today for:  1.  Hypertension follow-up Patient is compliant with Norvasc 10 mg daily, lisinopril 40 mg daily, metoprolol 25 mg extended release daily.  She does not report any chest pain, shortness of breath.  She is been feeling relatively well on this triple combination med.  Her blood pressures at home have been running systolics 130s with an occasional outlier in the 140s.  The reduction in his schedule to 8 hours/day seems to have really helped and she is hopeful that they can maintain this schedule going forward.   ROS: Per HPI  Allergies  Allergen Reactions   Penicillins Anaphylaxis   Sulfa Antibiotics Rash   Past Medical History:  Diagnosis Date   Anxiety    Diabetes mellitus without complication (HCC)    Elevated heart rate and blood pressure    Esophageal reflux    Migraines     Current Outpatient Medications:    amLODipine (NORVASC) 10 MG tablet, Take 1 tablet (10 mg total) by mouth daily., Disp: 90 tablet, Rfl: 3   Blood Glucose Monitoring Suppl DEVI, May substitute to any manufacturer covered by patient's insurance., Disp: 1 each, Rfl: 0   gabapentin (NEURONTIN) 300 MG capsule, Take 1 capsule (300 mg total) by mouth at bedtime for 7 days, THEN 1 capsule (300 mg total) 2 (two) times daily for 7 days, THEN 1 capsule (300 mg total) 3 (three) times daily., Disp: 270 capsule, Rfl: 3   Glucose Blood (BLOOD GLUCOSE TEST STRIPS) STRP, Check BGs daily. E11.9. May substitute to any manufacturer covered by patient's insurance., Disp: 100 strip, Rfl: PRN   Lancet Device MISC, Check BGs daily. E11.9. May substitute to any manufacturer covered by patient's insurance., Disp: 1 each, Rfl: 0   Lancets Misc. MISC, Check BGs daily. E11.9 May substitute to any manufacturer covered by patient's insurance., Disp: 100 each, Rfl: PRN   levothyroxine (SYNTHROID)  25 MCG tablet, Take 1 tablet by mouth daily., Disp: 90 tablet, Rfl: 3   lisinopril (ZESTRIL) 40 MG tablet, Take 1 tablet (40 mg total) by mouth daily., Disp: 90 tablet, Rfl: 3   LORazepam (ATIVAN) 0.5 MG tablet, Take 0.5 mg by mouth daily as needed for anxiety., Disp: , Rfl:    metoprolol succinate (TOPROL-XL) 25 MG 24 hr tablet, Take 1 tablet (25 mg total) by mouth daily., Disp: 90 tablet, Rfl: 3   rosuvastatin (CRESTOR) 5 MG tablet, Take 1 tablet (5 mg total) by mouth every other day., Disp: 90 tablet, Rfl: 3   Semaglutide, 1 MG/DOSE, 4 MG/3ML SOPN, Inject 1 mg as directed once a week., Disp: 3 mL, Rfl: 3   sertraline (ZOLOFT) 100 MG tablet, TAKE 1 AND 1/2 TABLETS BY MOUTH DAILY., Disp: 135 tablet, Rfl: 3   Vitamin D, Ergocalciferol, (DRISDOL) 1.25 MG (50000 UNIT) CAPS capsule, Take 1 capsule (50,000 Units total) by mouth every 7 (seven) days., Disp: 12 capsule, Rfl: 3   omeprazole (PRILOSEC) 20 MG capsule, TAKE 1 CAPSULE BY MOUTH TWICE DAILY BEFORE A MEAL (Patient taking differently: 20 mg daily.), Disp: 180 capsule, Rfl: 3 Social History   Socioeconomic History   Marital status: Widowed    Spouse name: Not on file   Number of children: Not on file   Years of education: Not on file   Highest education level: Not on file  Occupational History   Not on file  Tobacco Use  Smoking status: Every Day   Smokeless tobacco: Never  Vaping Use   Vaping Use: Never used  Substance and Sexual Activity   Alcohol use: Yes    Alcohol/week: 2.0 standard drinks of alcohol    Types: 2 Standard drinks or equivalent per week   Drug use: No   Sexual activity: Yes  Other Topics Concern   Not on file  Social History Narrative   Not on file   Social Determinants of Health   Financial Resource Strain: Not on file  Food Insecurity: Not on file  Transportation Needs: Not on file  Physical Activity: Not on file  Stress: Not on file  Social Connections: Not on file  Intimate Partner Violence: Not on  file   Family History  Problem Relation Age of Onset   Breast cancer Mother    Cancer Mother        breast   Hypertension Mother    Diabetes Father    Diabetes Paternal Uncle    Hypertension Daughter    Hyperlipidemia Daughter     Objective: Office vital signs reviewed. BP 137/89   Pulse (!) 104   Temp 97.8 F (36.6 C) (Oral)   Resp 20   Ht 5\' 2"  (1.575 m)   Wt 187 lb 8 oz (85 kg)   SpO2 97%   BMI 34.29 kg/m   Physical Examination:  General: Awake, alert, well nourished, No acute distress HEENT: sclera white, MMM Cardio: regular rate and rhythm, S1S2 heard, no murmurs appreciated Pulm: clear to auscultation bilaterally, no wheezes, rhonchi or rales; normal work of breathing on room air    Assessment/ Plan: 57 y.o. female   Hypertension associated with diabetes (HCC)  BP controlled upon recheck.  Continue triple therapy.  May follow-up in 3 months for diabetic follow-up, sooner if concerns arise.  No orders of the defined types were placed in this encounter.  No orders of the defined types were placed in this encounter.    Raliegh Ip, DO [facility] [phone]

## 2022-12-12 ENCOUNTER — Ambulatory Visit: Payer: Managed Care, Other (non HMO) | Admitting: Family Medicine

## 2022-12-12 ENCOUNTER — Encounter: Payer: Self-pay | Admitting: Family Medicine

## 2022-12-12 VITALS — BP 144/88 | HR 89 | Temp 98.5°F | Wt 188.0 lb

## 2022-12-12 DIAGNOSIS — E1169 Type 2 diabetes mellitus with other specified complication: Secondary | ICD-10-CM | POA: Diagnosis not present

## 2022-12-12 DIAGNOSIS — E785 Hyperlipidemia, unspecified: Secondary | ICD-10-CM

## 2022-12-12 DIAGNOSIS — E119 Type 2 diabetes mellitus without complications: Secondary | ICD-10-CM

## 2022-12-12 DIAGNOSIS — I152 Hypertension secondary to endocrine disorders: Secondary | ICD-10-CM | POA: Diagnosis not present

## 2022-12-12 DIAGNOSIS — E1159 Type 2 diabetes mellitus with other circulatory complications: Secondary | ICD-10-CM

## 2022-12-12 LAB — BAYER DCA HB A1C WAIVED: HB A1C (BAYER DCA - WAIVED): 6.2 % — ABNORMAL HIGH (ref 4.8–5.6)

## 2022-12-12 MED ORDER — HYDROCHLOROTHIAZIDE 25 MG PO TABS
12.5000 mg | ORAL_TABLET | Freq: Every day | ORAL | 3 refills | Status: DC | PRN
Start: 2022-12-12 — End: 2023-09-26

## 2022-12-12 NOTE — Progress Notes (Signed)
Subjective: CC:DM PCP: Raliegh Ip, DO OZH:YQMV [name] is a 57 y.o. female presenting to clinic today for:  1. Type 2 Diabetes with hypertension, hyperlipidemia:  She has had diet controlled diabetes.  She is compliant with her cholesterol medicine every other day and her blood pressure medications.  She has not taken her amlodipine blood pressure medicine the last 2 days because of swelling as below. Diabetes Health Maintenance Due  Topic Date Due   OPHTHALMOLOGY EXAM  05/23/2015   HEMOGLOBIN A1C  01/01/2023   FOOT EXAM  07/01/2023    Last A1c:  Lab Results  Component Value Date   HGBA1C 6.1 (H) 07/01/2022    ROS: No chest pain, shortness of breath but does report edema of the lower extremities at the end of the day.  She shows me a picture of moderate leg edema that is present at the end of the shift   ROS: Per HPI  Allergies  Allergen Reactions   Penicillins Anaphylaxis   Sulfa Antibiotics Rash   Past Medical History:  Diagnosis Date   Anxiety    Diabetes mellitus without complication (HCC)    Elevated heart rate and blood pressure    Esophageal reflux    Migraines     Current Outpatient Medications:    amLODipine (NORVASC) 10 MG tablet, Take 1 tablet (10 mg total) by mouth daily., Disp: 90 tablet, Rfl: 3   Blood Glucose Monitoring Suppl DEVI, May substitute to any manufacturer covered by patient's insurance., Disp: 1 each, Rfl: 0   gabapentin (NEURONTIN) 300 MG capsule, Take 1 capsule (300 mg total) by mouth at bedtime for 7 days, THEN 1 capsule (300 mg total) 2 (two) times daily for 7 days, THEN 1 capsule (300 mg total) 3 (three) times daily., Disp: 270 capsule, Rfl: 3   Glucose Blood (BLOOD GLUCOSE TEST STRIPS) STRP, Check BGs daily. E11.9. May substitute to any manufacturer covered by patient's insurance., Disp: 100 strip, Rfl: PRN   Lancet Device MISC, Check BGs daily. E11.9. May substitute to any manufacturer covered by patient's insurance.,  Disp: 1 each, Rfl: 0   Lancets Misc. MISC, Check BGs daily. E11.9 May substitute to any manufacturer covered by patient's insurance., Disp: 100 each, Rfl: PRN   levothyroxine (SYNTHROID) 25 MCG tablet, Take 1 tablet by mouth daily., Disp: 90 tablet, Rfl: 3   lisinopril (ZESTRIL) 40 MG tablet, Take 1 tablet (40 mg total) by mouth daily., Disp: 90 tablet, Rfl: 3   LORazepam (ATIVAN) 0.5 MG tablet, Take 0.5 mg by mouth daily as needed for anxiety., Disp: , Rfl:    metoprolol succinate (TOPROL-XL) 25 MG 24 hr tablet, Take 1 tablet (25 mg total) by mouth daily., Disp: 90 tablet, Rfl: 3   omeprazole (PRILOSEC) 20 MG capsule, TAKE 1 CAPSULE BY MOUTH TWICE DAILY BEFORE A MEAL (Patient taking differently: 20 mg daily.), Disp: 180 capsule, Rfl: 3   rosuvastatin (CRESTOR) 5 MG tablet, Take 1 tablet (5 mg total) by mouth every other day., Disp: 90 tablet, Rfl: 3   Semaglutide, 1 MG/DOSE, 4 MG/3ML SOPN, Inject 1 mg as directed once a week., Disp: 3 mL, Rfl: 3   sertraline (ZOLOFT) 100 MG tablet, TAKE 1 AND 1/2 TABLETS BY MOUTH DAILY., Disp: 135 tablet, Rfl: 3   Vitamin D, Ergocalciferol, (DRISDOL) 1.25 MG (50000 UNIT) CAPS capsule, Take 1 capsule (50,000 Units total) by mouth every 7 (seven) days., Disp: 12 capsule, Rfl: 3 Social History   Socioeconomic History   Marital  status: Widowed    Spouse name: Not on file   Number of children: Not on file   Years of education: Not on file   Highest education level: Not on file  Occupational History   Not on file  Tobacco Use   Smoking status: Every Day   Smokeless tobacco: Never  Vaping Use   Vaping status: Never Used  Substance and Sexual Activity   Alcohol use: Yes    Alcohol/week: 2.0 standard drinks of alcohol    Types: 2 Standard drinks or equivalent per week   Drug use: No   Sexual activity: Yes  Other Topics Concern   Not on file  Social History Narrative   Not on file   Social Determinants of Health   Financial Resource Strain: Not on file   Food Insecurity: Not on file  Transportation Needs: Not on file  Physical Activity: Not on file  Stress: Not on file  Social Connections: Not on file  Intimate Partner Violence: Not on file   Family History  Problem Relation Age of Onset   Breast cancer Mother    Cancer Mother        breast   Hypertension Mother    Diabetes Father    Diabetes Paternal Uncle    Hypertension Daughter    Hyperlipidemia Daughter     Objective: Office vital signs reviewed. BP (!) 144/88   Pulse 89   Temp 98.5 F (36.9 Tracy)   Wt 188 lb (85.3 kg)   SpO2 99%   BMI 34.39 kg/m   Physical Examination:  General: Awake, alert, well nourished, No acute distress HEENT: sclera white, MMM Cardio: regular rate and rhythm, S1S2 heard, no murmurs appreciated Pulm: clear to auscultation bilaterally, no wheezes, rhonchi or rales; normal work of breathing on room air Extremities: Warm, well-perfused.  No edema  Assessment/ Plan: 57 y.o. female   Diet-controlled diabetes mellitus (HCC) - Plan: Bayer DCA Hb A1c Waived  Hypertension associated with diabetes (HCC) - Plan: hydrochlorothiazide (HYDRODIURIL) 25 MG tablet  Hyperlipidemia associated with type 2 diabetes mellitus (HCC)   A1c under excellent control through diet alone with A1c of 6.2 today.  She is to schedule diabetic eye exam.  HCTZ sent for leg edema and blood pressure.  Okay to use as needed  Continue statin therapy.  Not due for fasting lipid yet.  Raliegh Ip, DO [facility] [phone]

## 2022-12-12 NOTE — Patient Instructions (Addendum)
Schedule diabetic eye exam And mammogram

## 2023-03-23 ENCOUNTER — Encounter: Payer: Self-pay | Admitting: Family Medicine

## 2023-04-29 ENCOUNTER — Other Ambulatory Visit: Payer: Self-pay | Admitting: Family Medicine

## 2023-04-29 MED ORDER — DOXYCYCLINE HYCLATE 100 MG PO TABS: 100.0000 mg | ORAL_TABLET | Freq: Two times a day (BID) | ORAL | 0 refills | Status: AC

## 2023-05-08 ENCOUNTER — Ambulatory Visit (INDEPENDENT_AMBULATORY_CARE_PROVIDER_SITE_OTHER): Payer: Managed Care, Other (non HMO)

## 2023-05-08 ENCOUNTER — Encounter: Payer: Self-pay | Admitting: Family Medicine

## 2023-05-08 ENCOUNTER — Ambulatory Visit: Payer: Managed Care, Other (non HMO) | Admitting: Family Medicine

## 2023-05-08 VITALS — BP 152/88 | HR 107 | Temp 97.4°F | Ht 62.0 in | Wt 190.2 lb

## 2023-05-08 DIAGNOSIS — M79672 Pain in left foot: Secondary | ICD-10-CM

## 2023-05-08 NOTE — Progress Notes (Signed)
Subjective:  Patient ID: [name], female    DOB: 10/20/1965  Age: 58 y.o. MRN: [id number]  CC: Foot Pain (Left top outer aspect worse today, but last 3 weeks)   HPI [name] presents for pain at left midfoot for 3 weeks. Had dropped a jar of pickles on the spot and had a bad bruise  about a week prior. 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 Pain has continued to worsen.      12/12/2022    2:41 PM 07/01/2022   10:15 AM 05/07/2021    2:51 PM  Depression screen PHQ 2/9  Decreased Interest 0 0 0  Down, Depressed, Hopeless 0 0 0  PHQ - 2 Score 0 0 0  Altered sleeping 0 0 0  Tired, decreased energy 0 0 0  Change in appetite 0 0 0  Feeling bad or failure about yourself  0 0 0  Trouble concentrating 0 0 1  Moving slowly or fidgety/restless 0 0 0  Suicidal thoughts 0 0 0  PHQ-9 Score 0 0 1  Difficult doing work/chores Not difficult at all  Not difficult at all    History Quincy has a past medical history of Anxiety, Diabetes mellitus without complication (HCC), Elevated heart rate and blood pressure, Esophageal reflux, and Migraines.   She has a past surgical history that includes Laparoscopic vaginal hysterectomy (N/A, 05 10 2012).   Her family history includes Breast cancer in her mother; Cancer in her mother; Diabetes in her father and paternal uncle; Hyperlipidemia in her daughter; Hypertension in her daughter and mother.She reports that she has been smoking. She has never used smokeless tobacco. She reports current alcohol use of about 2.0 standard drinks of alcohol per week. She reports that she does not use drugs.    ROS Review of Systems  Objective:  BP (!) 152/88   Pulse (!) 107   Temp (!) 97.4 F (36.3 C) (Temporal)   Ht 5\' 2"  (1.575 m)   Wt 190 lb 3.2 oz (86.3 kg)   SpO2 96%   BMI 34.79 kg/m   BP Readings from Last 3 Encounters:  05/08/23 (!) 152/88  12/12/22 (!) 144/88  10/12/22 137/89    Wt Readings from Last 3  Encounters:  05/08/23 190 lb 3.2 oz (86.3 kg)  12/12/22 188 lb (85.3 kg)  10/12/22 187 lb 8 oz (85 kg)     Physical Exam Musculoskeletal:        General: Tenderness (left midfoot) present.       Assessment & Plan:   [name] was seen today for foot pain.  Diagnoses and all orders for this visit:  Left foot pain -     DG Foot Complete Left       I am having [name] maintain her LORazepam, omeprazole, levothyroxine, metoprolol succinate, rosuvastatin, sertraline, gabapentin, Vitamin D (Ergocalciferol), Blood Glucose Monitoring Suppl, BLOOD GLUCOSE TEST STRIPS, Lancet Device, Lancets Misc., lisinopril, amLODipine, hydrochlorothiazide, and doxycycline.  Allergies as of 05/08/2023       Reactions   Penicillins Anaphylaxis   Sulfa Antibiotics Rash        Medication List        Accurate as of May 08, 2023  9:57 PM. If you have any questions, ask your nurse or doctor.          amLODipine 10 MG tablet Commonly known as: NORVASC Take 1 tablet (10 mg total) by mouth daily.  Blood Glucose Monitoring Suppl Devi May substitute to any manufacturer covered by patient's insurance.   BLOOD GLUCOSE TEST STRIPS Strp Check BGs daily. E11.9. May substitute to any manufacturer covered by patient's insurance.   doxycycline 100 MG tablet Commonly known as: VIBRA-TABS Take 1 tablet (100 mg total) by mouth 2 (two) times daily for 10 days.   gabapentin 300 MG capsule Commonly known as: NEURONTIN Take 1 capsule (300 mg total) by mouth at bedtime for 7 days, THEN 1 capsule (300 mg total) 2 (two) times daily for 7 days, THEN 1 capsule (300 mg total) 3 (three) times daily. Start taking on: July 01, 2022   hydrochlorothiazide 25 MG tablet Commonly known as: HYDRODIURIL Take 0.5-1 tablets (12.5-25 mg total) by mouth daily as needed (swelling).   Lancet Device Misc Check BGs daily. E11.9. May substitute to any manufacturer covered by patient's insurance.   Lancets  Misc. Misc Check BGs daily. E11.9 May substitute to any manufacturer covered by patient's insurance.   levothyroxine 25 MCG tablet Commonly known as: SYNTHROID Take 1 tablet by mouth daily.   lisinopril 40 MG tablet Commonly known as: ZESTRIL Take 1 tablet (40 mg total) by mouth daily.   LORazepam 0.5 MG tablet Commonly known as: ATIVAN Take 0.5 mg by mouth daily as needed for anxiety.   metoprolol succinate 25 MG 24 hr tablet Commonly known as: TOPROL-XL Take 1 tablet (25 mg total) by mouth daily.   omeprazole 20 MG capsule Commonly known as: PRILOSEC TAKE 1 CAPSULE BY MOUTH TWICE DAILY BEFORE A MEAL What changed:  how much to take when to take this   rosuvastatin 5 MG tablet Commonly known as: Crestor Take 1 tablet (5 mg total) by mouth every other day.   sertraline 100 MG tablet Commonly known as: ZOLOFT TAKE 1 AND 1/2 TABLETS BY MOUTH DAILY.   Vitamin D (Ergocalciferol) 1.25 MG (50000 UNIT) Caps capsule Commonly known as: DRISDOL Take 1 capsule (50,000 Units total) by mouth every 7 (seven) days.         Follow-up: Return if symptoms worsen or fail to improve.  Mechele Claude, M.D.

## 2023-05-19 ENCOUNTER — Encounter: Payer: Self-pay | Admitting: *Deleted

## 2023-09-04 ENCOUNTER — Telehealth: Payer: Self-pay | Admitting: Family Medicine

## 2023-09-22 ENCOUNTER — Ambulatory Visit: Admitting: Family Medicine

## 2023-09-26 ENCOUNTER — Encounter: Payer: Self-pay | Admitting: Family Medicine

## 2023-09-26 ENCOUNTER — Ambulatory Visit: Payer: Self-pay | Admitting: Family Medicine

## 2023-09-26 VITALS — BP 140/91 | HR 107 | Temp 98.4°F | Ht 62.0 in | Wt 190.4 lb

## 2023-09-26 DIAGNOSIS — E119 Type 2 diabetes mellitus without complications: Secondary | ICD-10-CM

## 2023-09-26 DIAGNOSIS — M5416 Radiculopathy, lumbar region: Secondary | ICD-10-CM

## 2023-09-26 DIAGNOSIS — E785 Hyperlipidemia, unspecified: Secondary | ICD-10-CM

## 2023-09-26 DIAGNOSIS — E039 Hypothyroidism, unspecified: Secondary | ICD-10-CM

## 2023-09-26 DIAGNOSIS — I152 Hypertension secondary to endocrine disorders: Secondary | ICD-10-CM

## 2023-09-26 DIAGNOSIS — E559 Vitamin D deficiency, unspecified: Secondary | ICD-10-CM

## 2023-09-26 DIAGNOSIS — E1169 Type 2 diabetes mellitus with other specified complication: Secondary | ICD-10-CM

## 2023-09-26 DIAGNOSIS — E1159 Type 2 diabetes mellitus with other circulatory complications: Secondary | ICD-10-CM

## 2023-09-26 LAB — BAYER DCA HB A1C WAIVED: HB A1C (BAYER DCA - WAIVED): 6.4 % — ABNORMAL HIGH (ref 4.8–5.6)

## 2023-09-26 MED ORDER — ROSUVASTATIN CALCIUM 5 MG PO TABS: 5.0000 mg | ORAL_TABLET | ORAL | 3 refills | Status: DC

## 2023-09-26 MED ORDER — HYDROCHLOROTHIAZIDE 25 MG PO TABS: 12.5000 mg | ORAL_TABLET | Freq: Every day | ORAL | 3 refills | Status: AC | PRN

## 2023-09-26 MED ORDER — AMLODIPINE BESYLATE 10 MG PO TABS: 10.0000 mg | ORAL_TABLET | Freq: Every day | ORAL | 3 refills | Status: DC

## 2023-09-26 MED ORDER — METOPROLOL SUCCINATE ER 25 MG PO TB24: 25.0000 mg | ORAL_TABLET | Freq: Every day | ORAL | 3 refills | Status: DC

## 2023-09-26 MED ORDER — LEVOTHYROXINE SODIUM 25 MCG PO TABS: 25.0000 ug | ORAL_TABLET | Freq: Every day | ORAL | 3 refills | Status: DC

## 2023-09-26 MED ORDER — SERTRALINE HCL 100 MG PO TABS: ORAL_TABLET | Freq: Every day | ORAL | 3 refills | Status: DC

## 2023-09-26 MED ORDER — GABAPENTIN 300 MG PO CAPS: 300.0000 mg | ORAL_CAPSULE | Freq: Three times a day (TID) | ORAL | 3 refills | Status: DC

## 2023-09-26 MED ORDER — LISINOPRIL 40 MG PO TABS: 40.0000 mg | ORAL_TABLET | Freq: Every day | ORAL | 3 refills | Status: DC

## 2023-09-26 MED ORDER — OMEPRAZOLE 20 MG PO CPDR: DELAYED_RELEASE_CAPSULE | ORAL | 3 refills | Status: DC

## 2023-09-26 MED ORDER — VITAMIN D (ERGOCALCIFEROL) 1.25 MG (50000 UNIT) PO CAPS: 50000.0000 [IU] | ORAL_CAPSULE | ORAL | 3 refills | Status: DC

## 2023-09-26 NOTE — Progress Notes (Signed)
 Subjective: CC:DM PCP: Eliodoro Guerin, DO [name] is a 58 y.o. female presenting to clinic today for:  1. Type 2 Diabetes with hypertension, hyperlipidemia:  Has been diet controlled.  She has had steady weight gain.  She does not really eat much sugar or carbs but she does eat a lot of salt.  Blood pressure is typically controlled at home and she is only taking the lisinopril , Crestor  and metoprolol .  Has amlodipine  at home if needed but does not use.  Has not required HCTZ.  Diabetes Health Maintenance Due  Topic Date Due   OPHTHALMOLOGY EXAM  05/23/2015   HEMOGLOBIN A1C  06/14/2023   FOOT EXAM  07/01/2023    Last A1c:  Lab Results  Component Value Date   HGBA1C 6.2 (H) 12/12/2022    ROS: Denies dizziness, LOC, polyuria, polydipsia, unintended weight loss/gain, foot ulcerations, numbness or tingling in extremities, shortness of breath or chest pain.  2.  Hypothyroidism Diagnosed and started on Synthroid  25 mcg last year.  She reports that she has not been taking the medication since she was euthyroid on last lab draw.  Again she has had some issues with weight as above.  Depressive symptoms are stable with sertraline  150 mg.  She feels much better from mental health standpoint since she has stopped working couple of weeks ago  ROS: Per HPI  Allergies  Allergen Reactions   Penicillins Anaphylaxis   Sulfa  Antibiotics Rash   Past Medical History:  Diagnosis Date   Anxiety    Diabetes mellitus without complication (HCC)    Elevated heart rate and blood pressure    Esophageal reflux    Migraines     Current Outpatient Medications:    Blood Glucose Monitoring Suppl DEVI, May substitute to any manufacturer covered by patient's insurance., Disp: 1 each, Rfl: 0   Glucose Blood (BLOOD GLUCOSE TEST STRIPS) STRP, Check BGs daily. E11.9. May substitute to any manufacturer covered by patient's insurance., Disp: 100 strip, Rfl: PRN   Lancet Device MISC, Check  BGs daily. E11.9. May substitute to any manufacturer covered by patient's insurance., Disp: 1 each, Rfl: 0   Lancets Misc. MISC, Check BGs daily. E11.9 May substitute to any manufacturer covered by patient's insurance., Disp: 100 each, Rfl: PRN   amLODipine  (NORVASC ) 10 MG tablet, Take 1 tablet (10 mg total) by mouth daily., Disp: 90 tablet, Rfl: 3   gabapentin  (NEURONTIN ) 300 MG capsule, Take 1 capsule (300 mg total) by mouth 3 (three) times daily., Disp: 270 capsule, Rfl: 3   hydrochlorothiazide  (HYDRODIURIL ) 25 MG tablet, Take 0.5-1 tablets (12.5-25 mg total) by mouth daily as needed (swelling)., Disp: 90 tablet, Rfl: 3   levothyroxine  (SYNTHROID ) 25 MCG tablet, Take 1 tablet by mouth daily., Disp: 90 tablet, Rfl: 3   lisinopril  (ZESTRIL ) 40 MG tablet, Take 1 tablet (40 mg total) by mouth daily., Disp: 90 tablet, Rfl: 3   LORazepam (ATIVAN) 0.5 MG tablet, Take 0.5 mg by mouth daily as needed for anxiety. (Patient not taking: Reported on 09/26/2023), Disp: , Rfl:    metoprolol  succinate (TOPROL -XL) 25 MG 24 hr tablet, Take 1 tablet (25 mg total) by mouth daily., Disp: 90 tablet, Rfl: 3   omeprazole  (PRILOSEC) 20 MG capsule, TAKE 1 CAPSULE BY MOUTH TWICE DAILY BEFORE A MEAL, Disp: 180 capsule, Rfl: 3   rosuvastatin  (CRESTOR ) 5 MG tablet, Take 1 tablet (5 mg total) by mouth every other day., Disp: 90 tablet, Rfl: 3   sertraline  (ZOLOFT ) 100  MG tablet, TAKE 1 AND 1/2 TABLETS BY MOUTH DAILY., Disp: 135 tablet, Rfl: 3   Vitamin D , Ergocalciferol , (DRISDOL ) 1.25 MG (50000 UNIT) CAPS capsule, Take 1 capsule (50,000 Units total) by mouth every 7 (seven) days., Disp: 12 capsule, Rfl: 3 Social History   Socioeconomic History   Marital status: Widowed    Spouse name: Not on file   Number of children: Not on file   Years of education: Not on file   Highest education level: Not on file  Occupational History   Not on file  Tobacco Use   Smoking status: Every Day   Smokeless tobacco: Never  Vaping Use    Vaping status: Never Used  Substance and Sexual Activity   Alcohol use: Yes    Alcohol/week: 2.0 standard drinks of alcohol    Types: 2 Standard drinks or equivalent per week   Drug use: No   Sexual activity: Yes  Other Topics Concern   Not on file  Social History Narrative   Not on file   Social Drivers of Health   Financial Resource Strain: Not on file  Food Insecurity: Not on file  Transportation Needs: Not on file  Physical Activity: Not on file  Stress: Not on file  Social Connections: Not on file  Intimate Partner Violence: Not on file   Family History  Problem Relation Age of Onset   Breast cancer Mother    Cancer Mother        breast   Hypertension Mother    Diabetes Father    Diabetes Paternal Uncle    Hypertension Daughter    Hyperlipidemia Daughter     Objective: Office vital signs reviewed. BP (!) 140/91   Pulse (!) 107   Temp 98.4 F (36.9 C)   Ht 5\' 2"  (1.575 m)   Wt 190 lb 6.4 oz (86.4 kg)   SpO2 97%   BMI 34.82 kg/m   Physical Examination:  General: Awake, alert, obese, No acute distress HEENT: sclera white, MMM Cardio: regular rate and rhythm, S1S2 heard, no murmurs appreciated Pulm: clear to auscultation bilaterally, no wheezes, rhonchi or rales; normal work of breathing on room air GI: soft, non-tender, non-distended, bowel sounds present x4, no hepatomegaly, no splenomegaly, no masses   Assessment/ Plan: 58 y.o. female   Controlled type 2 diabetes mellitus without complication, without long-term current use of insulin (HCC) - Plan: Bayer DCA Hb A1c Waived, AMB Referral VBCI Care Management, CANCELED: Microalbumin / creatinine urine ratio  Hypertension associated with diabetes (HCC) - Plan: CMP14+EGFR, amLODipine  (NORVASC ) 10 MG tablet, hydrochlorothiazide  (HYDRODIURIL ) 25 MG tablet, metoprolol  succinate (TOPROL -XL) 25 MG 24 hr tablet, AMB Referral VBCI Care Management  Hyperlipidemia associated with type 2 diabetes mellitus (HCC) -  Plan: CMP14+EGFR, rosuvastatin  (CRESTOR ) 5 MG tablet, AMB Referral VBCI Care Management  Acquired hypothyroidism - Plan: TSH + free T4, levothyroxine  (SYNTHROID ) 25 MCG tablet  Lumbar back pain with radiculopathy affecting left lower extremity - Plan: gabapentin  (NEURONTIN ) 300 MG capsule  Vitamin D  insufficiency - Plan: Vitamin D , Ergocalciferol , (DRISDOL ) 1.25 MG (50000 UNIT) CAPS capsule  Sugar remains well-controlled with A1c of 6.4.  However, I think that she would benefit from cardiac protection that we can gain from use of Ozempic  given her multiple comorbidities and known obstructive sleep apnea.  I have referred her to clinical pharmacy to see if we might be of to get her some assistance since she is now unemployed with no insurance.  She was amenable to referral  I renewed all medications for her.  Her blood pressure was NOT well-controlled.  She may need to resume use of the amlodipine  at least 5 mg daily.  Did not obtain fasting lipid today due to lack of insurance so we obtained minimal labs today  Did not discuss back pain or vitamin D  insufficiency  Flem Enderle Bambi Bonine, DO [facility] [phone]

## 2023-09-27 ENCOUNTER — Ambulatory Visit: Payer: Self-pay | Admitting: Family Medicine

## 2023-09-27 LAB — CMP14+EGFR
ALT: 39 IU/L — ABNORMAL HIGH (ref 0–32)
AST: 42 IU/L — ABNORMAL HIGH (ref 0–40)
Albumin: 4.3 g/dL (ref 3.8–4.9)
Alkaline Phosphatase: 83 IU/L (ref 44–121)
BUN/Creatinine Ratio: 19 (ref 9–23)
BUN: 13 mg/dL (ref 6–24)
Bilirubin Total: 0.3 mg/dL (ref 0.0–1.2)
CO2: 24 mmol/L (ref 20–29)
Calcium: 10.9 mg/dL — ABNORMAL HIGH (ref 8.7–10.2)
Chloride: 100 mmol/L (ref 96–106)
Creatinine, Ser: 0.7 mg/dL (ref 0.57–1.00)
Globulin, Total: 2.6 g/dL (ref 1.5–4.5)
Glucose: 129 mg/dL — ABNORMAL HIGH (ref 70–99)
Potassium: 4 mmol/L (ref 3.5–5.2)
Sodium: 141 mmol/L (ref 134–144)
Total Protein: 6.9 g/dL (ref 6.0–8.5)
eGFR: 101 mL/min/{1.73_m2} (ref >59)

## 2023-09-27 LAB — TSH+FREE T4
Free T4: 0.91 ng/dL (ref 0.82–1.77)
TSH: 2.79 u[IU]/mL (ref 0.450–4.500)

## 2023-09-28 ENCOUNTER — Telehealth: Payer: Self-pay

## 2023-09-28 NOTE — Progress Notes (Signed)
 Care Guide Pharmacy Note  09/28/2023 Name: [name] MRN: [id number] DOB: Aug 10, 1965  Referred By: Eliodoro Guerin, DO Reason for referral: Complex Care Management (Outreach to schedule with Pharm d )   [name] is a 58 y.o. year old female who is a primary care patient of Eliodoro Guerin, DO.  [name] was referred to the pharmacist for assistance related to: DMII  Successful contact was made with the patient to discuss pharmacy services including being ready for the pharmacist to call at least 5 minutes before the scheduled appointment time and to have medication bottles and any blood pressure readings ready for review. The patient agreed to meet with the pharmacist via telephone visit on (date/time).10/26/2023  Lenton Rail , RMA     [place]  [facility] Guide  Direct Dial: [phone]  Website: [email or web address]

## 2023-10-26 ENCOUNTER — Other Ambulatory Visit: Payer: Self-pay

## 2023-10-26 ENCOUNTER — Telehealth: Payer: Self-pay | Admitting: Pharmacist

## 2023-10-26 DIAGNOSIS — Z7985 Long-term (current) use of injectable non-insulin antidiabetic drugs: Secondary | ICD-10-CM

## 2023-10-26 DIAGNOSIS — E119 Type 2 diabetes mellitus without complications: Secondary | ICD-10-CM

## 2023-10-26 NOTE — Telephone Encounter (Signed)
 Can you send me PAP for Ozempic  1mg  weekly New PAP Uninsured Thankyou!

## 2023-10-26 NOTE — Progress Notes (Signed)
 10/31/2023 Name: [name] MRN: [id number] DOB: 1965-08-29  Chief Complaint  Patient presents with   Diabetes   Medication Assistance    [name] is a 58 y.o. year old female who presented for a telephone visit.  I connected with  [name] on 10/26/23 by telephone and verified that I am speaking with the correct person using two identifiers. I discussed the limitations of evaluation and management by telemedicine. The patient expressed understanding and agreed to proceed.  Patient was located in her home and PharmD in PCP office during this visit.    They were referred to the pharmacist by their PCP for assistance in managing diabetes and medication access.    Subjective:  Care Team: Primary Care Provider: Jolinda Norene HERO, DO    Medication Access/Adherence  Current Pharmacy:  [facility] [place], [place] - [address] 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 Patient denies hyperglycemic symptoms including polyuria, polydipsia, polyphagia, nocturia, neuropathy, blurred vision.  Current meal patterns:  Discussed meal planning options and Plate method for healthy eating Avoid sugary drinks and desserts Incorporate balanced protein, non starchy veggies, 1 serving of carbohydrate with each meal Increase water intake Increase physical activity as able  Current physical  activity: encouraged as able, keeps active with her grandson  Current medication access support: will enroll in novo PAP   Objective:  Lab Results  Component Value Date   HGBA1C 6.4 (H) 09/26/2023    Lab Results  Component Value Date   CREATININE 0.70 09/26/2023   BUN 13 09/26/2023   NA 141 09/26/2023   K 4.0 09/26/2023   CL 100 09/26/2023   CO2 24 09/26/2023    Lab Results  Component Value Date   CHOL 243 (H) 07/01/2022   HDL 58 07/01/2022   LDLCALC 141 (H) 07/01/2022   TRIG 248 (H) 07/01/2022   CHOLHDL 4.2 07/01/2022    Medications Reviewed Today     Reviewed by Billee Mliss BIRCH, [facility] (Pharmacist) on 10/31/23 at 1325  Med List Status: <None>   Medication Order Taking? Sig Documenting Provider Last Dose Status Informant  amLODipine  (NORVASC ) 10 MG tablet [id number]  Take 1 tablet (10 mg total) by mouth daily. Jolinda Norene HERO, DO  Active   Blood Glucose Monitoring Suppl DEVI [id number] No May substitute to any manufacturer covered by patient's insurance. Jolinda Norene M, DO Taking Active   gabapentin  (NEURONTIN ) 300 MG capsule [id number]  Take 1 capsule (300 mg total) by mouth 3 (three) times daily. Jolinda Norene M, DO  Active   Glucose Blood (BLOOD GLUCOSE TEST STRIPS) STRP [id number] No Check BGs daily. E11.9. May substitute to any manufacturer covered by patient's insurance. Jolinda Norene M, DO Taking Active   hydrochlorothiazide  (HYDRODIURIL ) 25 MG tablet [id number]  Take 0.5-1 tablets (12.5-25 mg total) by mouth daily as needed (swelling). Jolinda Norene HERO, DO  Active   Lancet Device MISC [id number] No Check BGs daily. E11.9. May substitute to  any manufacturer covered by [employer]. Jolinda Norene HERO, DO Taking Active   Lancets Misc. MISC [id number] No Check BGs daily. E11.9 May substitute to any manufacturer covered by patient's insurance. Jolinda Norene M, DO Taking Active   levothyroxine  (SYNTHROID ) 25 MCG tablet [id number]  Take 1 tablet by  mouth daily. Jolinda Norene M, DO  Active   lisinopril  (ZESTRIL ) 40 MG tablet [id number]  Take 1 tablet (40 mg total) by mouth daily. Jolinda Norene M, DO  Active   LORazepam (ATIVAN) 0.5 MG tablet [id number] No Take 0.5 mg by mouth daily as needed for anxiety.  Patient not taking: Reported on 09/26/2023   [provider] Not Taking Active Self  metoprolol  succinate (TOPROL -XL) 25 MG 24 hr tablet [id number]  Take 1 tablet (25 mg total) by mouth daily. Jolinda Norene M, DO  Active   omeprazole  (PRILOSEC) 20 MG capsule [id number]  TAKE 1 CAPSULE BY MOUTH TWICE DAILY BEFORE A MEAL Gottschalk, Ashly M, DO  Active   rosuvastatin  (CRESTOR ) 5 MG tablet [id number]  Take 1 tablet (5 mg total) by mouth every other day. Jolinda Norene M, DO  Active   Semaglutide ,0.25 or 0.5MG /DOS, (OZEMPIC , 0.25 OR 0.5 MG/DOSE,) 2 MG/3ML SOPN [id number] Yes Inject 0.5 mg into the skin once a week. Novo nordisk patient assistance Jolinda Norene M, [place]  Active   sertraline  (ZOLOFT ) 100 MG tablet [id number]  TAKE 1 AND 1/2 TABLETS BY MOUTH DAILY. Jolinda Norene M, DO  Active   Vitamin D , Ergocalciferol , (DRISDOL ) 1.25 MG (50000 UNIT) CAPS capsule [id number]  Take 1 capsule (50,000 Units total) by mouth every 7 (seven) days. Jolinda Norene M, DO  Active               Assessment/Plan:   Diabetes: - Currently controlled-restart ozempic  0.5mg  weekly (patient was on in the past, but now uninsured and will enroll for PAP); sample given today.  Will titrate as needed - Reviewed long term cardiovascular and renal outcomes of uncontrolled blood sugar - Reviewed goal A1c, goal fasting, and goal 2 hour post prandial glucose - Patient denies personal or family history of multiple endocrine neoplasia type 2, medullary thyroid  cancer; personal history of pancreatitis or gallbladder disease. - Recommend to check glucose daily (fasting) or if symptomatic - Meets financial criteria for Ozempic  patient assistance  program through novo nordisk. Will collaborate with provider, CPhT, and patient to pursue assistance.     Follow Up Plan: 1 month  Mliss Tarry Griffin, PharmD, BCACP, CPP Clinical Pharmacist, [facility]

## 2023-10-27 ENCOUNTER — Telehealth: Payer: Self-pay

## 2023-10-27 NOTE — Progress Notes (Unsigned)
 Pharmacy Medication Assistance Program Note    10/27/2023  Patient ID: [name], female   DOB: Nov 20, 1965, 58 y.o.   MRN: [id number]     10/27/2023  Outreach Medication One  Initial Outreach Date (Medication One) 10/27/2023  Manufacturer Medication One Novo Nordisk  Nordisk Drugs Ozempic   Dose of Ozempic  1MG   Type of Electrical engineer

## 2023-10-27 NOTE — Telephone Encounter (Signed)
 PAP: Patient assistance application for Ozempic  through Novo Nordisk has been SENT TO JULIE PRUITT FOR FURTHER PROCESSING FOR PT

## 2023-10-31 MED ORDER — OZEMPIC (0.25 OR 0.5 MG/DOSE) 2 MG/3ML ~~LOC~~ SOPN: 0.5000 mg | PEN_INJECTOR | SUBCUTANEOUS | Status: AC

## 2023-11-13 NOTE — Progress Notes (Signed)
 Pharmacy Medication Assistance Program Note    11/14/2023  Patient ID: [name], female   DOB: 20-Aug-1965, 58 y.o.   MRN: [id number]     10/27/2023  Outreach Medication One  Initial Outreach Date (Medication One) 10/27/2023  Manufacturer Medication One Novo Nordisk  Nordisk Drugs Ozempic   Dose of Ozempic  1MG   Type of Sport and exercise psychologist  Date Application Received From Provider 11/09/2023  Date Application Submitted to Manufacturer 11/14/2023  Method Application Sent to Manufacturer Fax     NEW - submitted

## 2023-11-14 ENCOUNTER — Other Ambulatory Visit (HOSPITAL_COMMUNITY): Payer: Self-pay

## 2023-12-12 ENCOUNTER — Other Ambulatory Visit (HOSPITAL_COMMUNITY): Payer: Self-pay

## 2023-12-12 NOTE — Telephone Encounter (Addendum)
 Per automated system: missing info.  Most likely pending due to old application being submitted (new application went into effect 11/14/23).   Will attempt online.

## 2023-12-12 NOTE — Progress Notes (Addendum)
 Pharmacy Medication Assistance Program Note    12/12/2023  Patient ID: [name], female   DOB: Jun 26, 1965, 58 y.o.   MRN: [id number]     10/27/2023  Outreach Medication One  Initial Outreach Date (Medication One) 10/27/2023  Manufacturer Medication One [employer] Drugs Ozempic   Dose of Ozempic  1MG   Type of Sport and exercise psychologist  Date Application Submitted to [employer] 12/12/2023  Method Application Sent to Manufacturer Online     New - RESUBMITTED [employer] emailed to [employer]

## 2023-12-15 NOTE — Progress Notes (Signed)
 Pharmacy Medication Assistance Program Note    12/15/2023  Patient ID: [name], female   DOB: January 14, 1966, 58 y.o.   MRN: [id number]     10/27/2023  Outreach Medication One  Initial Outreach Date (Medication One) 10/27/2023  Manufacturer Medication One Novo Nordisk  Nordisk Drugs Ozempic   Dose of Ozempic  1MG   Type of Sport and exercise psychologist  Date Application Submitted to Manufacturer 12/12/2023  Method Application Sent to Manufacturer Online  Patient Assistance Determination Approved  Approval Start Date 12/14/2023  Approval End Date 12/07/2024     APPROVED

## 2024-01-09 ENCOUNTER — Ambulatory Visit: Admitting: Nurse Practitioner

## 2024-01-16 ENCOUNTER — Encounter: Payer: Self-pay | Admitting: Nurse Practitioner

## 2024-01-16 ENCOUNTER — Ambulatory Visit: Admitting: Nurse Practitioner

## 2024-01-16 VITALS — BP 138/90 | HR 83 | Temp 97.6°F

## 2024-01-16 DIAGNOSIS — Z1231 Encounter for screening mammogram for malignant neoplasm of breast: Secondary | ICD-10-CM

## 2024-01-16 DIAGNOSIS — Z789 Other specified health status: Secondary | ICD-10-CM

## 2024-01-16 NOTE — Progress Notes (Signed)
 [facility]ational [facility]- [facility]  Subjective:  Patient ID: [name], female    DOB: 1966/03/07  Age: 58 y.o. MRN: [id number]  CC: Wellness exam    HPI [name] presents for wellness exam visit for insurance benefit.  Patient has a PCP: Dr. Jolinda  PMH significant for: HTN, T2DM.  Last labs per PCP were completed: June 2025  Health Maintenance:  Colonoscopy: overdue for this Mammogram: overdue, interested in this. Mother was diagnosed with breast cancer at age 42.  Pap: hx of hysterectomy     Smoker: 1/2 pack a day for 40 year  Immunizations:  Shingrix-  interested in this.  Flu- yearly  Tdap- 2018    Lifestyle: Diet- no particular diet, decreased appetite.  Exercise- none.     Past Medical History:  Diagnosis Date   Anxiety    Diabetes mellitus without complication (HCC)    Elevated heart rate and blood pressure    Esophageal reflux    Migraines     Past Surgical History:  Procedure Laterality Date   LAPAROSCOPIC VAGINAL HYSTERECTOMY N/A 05 10 2012    Outpatient Medications Prior to Visit  Medication Sig Dispense Refill   amLODipine  (NORVASC ) 10 MG tablet Take 1 tablet (10 mg total) by mouth daily. 90 tablet 3   levothyroxine  (SYNTHROID ) 25 MCG tablet Take 1 tablet by mouth daily. 90 tablet 3   lisinopril  (ZESTRIL ) 40 MG tablet Take 1 tablet (40 mg total) by mouth daily. 90 tablet 3   metoprolol  succinate (TOPROL -XL) 25 MG 24 hr tablet Take 1 tablet (25 mg total) by mouth daily. 90 tablet 3   omeprazole  (PRILOSEC) 20 MG capsule TAKE 1 CAPSULE BY MOUTH TWICE DAILY BEFORE A MEAL 180 capsule 3   Semaglutide ,0.25 or 0.5MG /DOS, (OZEMPIC , 0.25 OR 0.5 MG/DOSE,) 2 MG/3ML SOPN Inject 0.5 mg into the skin once a week. Novo nordisk patient assistance     sertraline  (ZOLOFT ) 100 MG tablet TAKE 1 AND 1/2 TABLETS BY MOUTH DAILY. 135 tablet 3   Vitamin D , Ergocalciferol , (DRISDOL ) 1.25 MG (50000 UNIT) CAPS capsule Take 1 capsule (50,000 Units total) by  mouth every 7 (seven) days. 12 capsule 3   Blood Glucose Monitoring Suppl DEVI May substitute to any manufacturer covered by patient's insurance. 1 each 0   Glucose Blood (BLOOD GLUCOSE TEST STRIPS) STRP Check BGs daily. E11.9. May substitute to any manufacturer covered by patient's insurance. 100 strip PRN   hydrochlorothiazide  (HYDRODIURIL ) 25 MG tablet Take 0.5-1 tablets (12.5-25 mg total) by mouth daily as needed (swelling). (Patient not taking: Reported on 01/16/2024) 90 tablet 3   Lancet Device MISC Check BGs daily. E11.9. May substitute to any manufacturer covered by patient's insurance. 1 each 0   Lancets Misc. MISC Check BGs daily. E11.9 May substitute to any manufacturer covered by patient's insurance. 100 each PRN   LORazepam (ATIVAN) 0.5 MG tablet Take 0.5 mg by mouth daily as needed for anxiety. (Patient not taking: Reported on 01/16/2024)     rosuvastatin  (CRESTOR ) 5 MG tablet Take 1 tablet (5 mg total) by mouth every other day. (Patient not taking: Reported on 01/16/2024) 90 tablet 3   gabapentin  (NEURONTIN ) 300 MG capsule Take 1 capsule (300 mg total) by mouth 3 (three) times daily. (Patient not taking: Reported on 01/16/2024) 270 capsule 3   No facility-administered medications prior to visit.    ROS Review of Systems  HENT:  Negative for hearing loss.   Eyes:  Negative for visual disturbance (needs vision checked,  strained.).  Respiratory:  Negative for shortness of breath.   Cardiovascular:  Negative for chest pain and leg swelling.  Gastrointestinal:  Positive for nausea (r/t to ozempic ). Negative for constipation, diarrhea and vomiting.  Musculoskeletal:  Negative for arthralgias and back pain.  Neurological:  Negative for dizziness and headaches.    Objective:  BP (!) 138/90 (BP Location: Left Arm, Patient Position: Sitting, Cuff Size: Normal)   Pulse 83   Temp 97.6 F (36.4 C) (Temporal)   SpO2 96%   Physical Exam Constitutional:      General: She is not in acute  distress. HENT:     Head: Normocephalic.     Right Ear: Tympanic membrane, ear canal and external ear normal.     Left Ear: Tympanic membrane, ear canal and external ear normal.     Nose: Nose normal.     Mouth/Throat:     Mouth: Mucous membranes are moist.     Pharynx: Oropharynx is clear.  Eyes:     Pupils: Pupils are equal, round, and reactive to light.  Cardiovascular:     Rate and Rhythm: Normal rate and regular rhythm.     Heart sounds: Normal heart sounds.  Pulmonary:     Effort: Pulmonary effort is normal.     Breath sounds: Normal breath sounds.  Abdominal:     General: Bowel sounds are normal.     Palpations: Abdomen is soft.  Musculoskeletal:        General: Normal range of motion.     Right lower leg: No edema.     Left lower leg: No edema.  Neurological:     General: No focal deficit present.     Mental Status: She is alert and oriented to person, place, and time. Mental status is at baseline.  Psychiatric:        Mood and Affect: Mood normal.        Behavior: Behavior normal.      Assessment & Plan:    [name] was seen today for wellness exam .  Diagnoses and all orders for this visit:  Participant in health and wellness plan Adult wellness physical was conducted today. Importance of diet and exercise were discussed in detail.  We reviewed immunizations and gave recommendations regarding current immunization needed for age. Will reach out to me when she is ready to schedule shingles vaccine.  Preventative health exams needed: Colonoscopy and mammogram. Mammogram order in place.   Patient was advised yearly wellness exam.   Encounter for screening mammogram for breast cancer -     MM 3D SCREENING MAMMOGRAM BILATERAL BREAST; Future    Orders Placed This Encounter  Procedures   MM 3D SCREENING MAMMOGRAM BILATERAL BREAST    No orders of the defined types were placed in this encounter.   Follow-up: as needed.

## 2024-02-15 ENCOUNTER — Ambulatory Visit

## 2024-04-16 ENCOUNTER — Encounter: Payer: Self-pay | Admitting: Family Medicine

## 2024-04-16 ENCOUNTER — Ambulatory Visit (INDEPENDENT_AMBULATORY_CARE_PROVIDER_SITE_OTHER): Payer: Self-pay | Admitting: Family Medicine

## 2024-04-16 VITALS — BP 130/81 | HR 84 | Temp 97.9°F | Ht 62.0 in | Wt 181.5 lb

## 2024-04-16 DIAGNOSIS — E559 Vitamin D deficiency, unspecified: Secondary | ICD-10-CM

## 2024-04-16 DIAGNOSIS — I152 Hypertension secondary to endocrine disorders: Secondary | ICD-10-CM | POA: Diagnosis not present

## 2024-04-16 DIAGNOSIS — E1169 Type 2 diabetes mellitus with other specified complication: Secondary | ICD-10-CM | POA: Diagnosis not present

## 2024-04-16 DIAGNOSIS — E66811 Obesity, class 1: Secondary | ICD-10-CM | POA: Diagnosis not present

## 2024-04-16 DIAGNOSIS — Z7985 Long-term (current) use of injectable non-insulin antidiabetic drugs: Secondary | ICD-10-CM | POA: Diagnosis not present

## 2024-04-16 DIAGNOSIS — E1159 Type 2 diabetes mellitus with other circulatory complications: Secondary | ICD-10-CM | POA: Diagnosis not present

## 2024-04-16 DIAGNOSIS — E119 Type 2 diabetes mellitus without complications: Secondary | ICD-10-CM

## 2024-04-16 DIAGNOSIS — E785 Hyperlipidemia, unspecified: Secondary | ICD-10-CM

## 2024-04-16 DIAGNOSIS — E039 Hypothyroidism, unspecified: Secondary | ICD-10-CM | POA: Diagnosis not present

## 2024-04-16 DIAGNOSIS — Z0000 Encounter for general adult medical examination without abnormal findings: Secondary | ICD-10-CM

## 2024-04-16 DIAGNOSIS — Z1211 Encounter for screening for malignant neoplasm of colon: Secondary | ICD-10-CM

## 2024-04-16 MED ORDER — AMLODIPINE BESYLATE 10 MG PO TABS: 10.0000 mg | ORAL_TABLET | Freq: Every day | ORAL | 3 refills | Status: AC

## 2024-04-16 MED ORDER — VITAMIN D (ERGOCALCIFEROL) 1.25 MG (50000 UNIT) PO CAPS: 50000.0000 [IU] | ORAL_CAPSULE | ORAL | 3 refills | Status: AC

## 2024-04-16 MED ORDER — OMEPRAZOLE 20 MG PO CPDR: DELAYED_RELEASE_CAPSULE | ORAL | 3 refills | Status: AC

## 2024-04-16 MED ORDER — LISINOPRIL 40 MG PO TABS: 40.0000 mg | ORAL_TABLET | Freq: Every day | ORAL | 3 refills | Status: AC

## 2024-04-16 MED ORDER — SERTRALINE HCL 100 MG PO TABS: ORAL_TABLET | Freq: Every day | ORAL | 3 refills | Status: AC

## 2024-04-16 MED ORDER — ROSUVASTATIN CALCIUM 5 MG PO TABS: 5.0000 mg | ORAL_TABLET | ORAL | 3 refills | Status: AC

## 2024-04-16 MED ORDER — LEVOTHYROXINE SODIUM 25 MCG PO TABS: 25.0000 ug | ORAL_TABLET | Freq: Every day | ORAL | 3 refills | Status: AC

## 2024-04-16 MED ORDER — METOPROLOL SUCCINATE ER 25 MG PO TB24: 25.0000 mg | ORAL_TABLET | Freq: Every day | ORAL | 3 refills | Status: AC

## 2024-04-16 NOTE — Progress Notes (Signed)
 "  [name] is a 58 y.o. female presents to office today for annual physical exam examination.     Type 2 Diabetes with hypertension, hyperlipidemia:  Not monitoring blood sugars.  Compliant with Ozempic  0.5.  Stopped statin after her LFTs were elevated.  She does watch her diet and salt restricts.  No reports of structured physical exercise.  Drinks a couple of glasses a couple of days per week and smokes socially during those times.  Utilizes Goody powders as needed pain.   Last eye exam: Scheduled for January Last foot exam: Needs Last A1c:  Lab Results  Component Value Date   HGBA1C 6.4 (H) 09/26/2023   Nephropathy screen indicated?:  Needs Last flu, zoster and/or pneumovax:  Immunization History  Administered Date(s) Administered   Hepatitis B 10/15/1991, 11/12/1991   Influenza Split 01/29/2013   Influenza,inj,Quad PF,6+ Mos 01/20/2014, 01/27/2015, 01/25/2016, 01/18/2017, 01/30/2018, 02/04/2019   Influenza-Unspecified 02/11/2024   PFIZER(Purple Top)SARS-COV-2 Vaccination 12/12/2019, 01/17/2020   Td 08/11/2016   Tdap 05/11/2009    ROS: Forte some blurred vision which is why she has her appointment with the eye doctor.  No loss of vision or double vision.  No chest pain, shortness of breath, neuropathic changes or sores on the feet.  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 [place], Marital status: Widowed, Substance use: Social tobacco and alcohol use as above Health Maintenance Due  Topic Date Due   Diabetic kidney evaluation - Urine ACR  07/01/2023   HEMOGLOBIN A1C  03/27/2024    Immunization History  Administered Date(s) Administered   Hepatitis B 10/15/1991, 11/12/1991   Influenza Split 01/29/2013   Influenza,inj,Quad PF,6+ Mos 01/20/2014, 01/27/2015, 01/25/2016, 01/18/2017, 01/30/2018, 02/04/2019   Influenza-Unspecified 02/11/2024   PFIZER(Purple Top)SARS-COV-2 Vaccination 12/12/2019, 01/17/2020   Td 08/11/2016   Tdap 05/11/2009    Past Medical History:  Diagnosis Date   Anxiety    Diabetes mellitus without complication (HCC)    Elevated heart rate and blood pressure    Esophageal reflux    Migraines    Social History   Socioeconomic History   Marital status: Widowed    Spouse name: Not on file   Number of children: Not on file   Years of education: Not on file   Highest education level: Not on file  Occupational History   Not on file  Tobacco Use   Smoking status: Every Day   Smokeless tobacco: Never  Vaping Use   Vaping status: Never Used  Substance and Sexual Activity   Alcohol use: Yes    Alcohol/week: 2.0 standard drinks of alcohol    Types: 2 Standard drinks or equivalent per week   Drug use: No   Sexual activity: Yes  Other Topics Concern   Not on file  Social History Narrative   Not on file   Social Drivers of Health   Tobacco Use: High Risk (04/16/2024)   Patient History    Smoking Tobacco Use: Every Day    Smokeless Tobacco Use: Never    Passive Exposure: Not on file  Financial Resource Strain: Not on file  Food Insecurity: Not on file  Transportation Needs: Not on file  Physical Activity: Not on file  Stress: Not on file  Social Connections: Not on file  Intimate Partner Violence: Not on file  Depression (EYV7-0): Low Risk (09/26/2023)   Depression (PHQ2-9)    PHQ-2 Score: 0  Alcohol Screen: Not on file  Housing: Not on file  Utilities: Not  on file  Health Literacy: Not on file   Past Surgical History:  Procedure Laterality Date   LAPAROSCOPIC VAGINAL HYSTERECTOMY N/A 05 10 2012   Family History  Problem Relation Age of Onset   Breast cancer Mother    Cancer Mother        breast   Hypertension Mother    Diabetes Father    Diabetes Paternal Uncle    Hypertension Daughter    Hyperlipidemia Daughter    Current Medications[1]  Allergies[2]   ROS: Review of Systems Pertinent items noted in HPI and remainder of comprehensive ROS otherwise negative.     Physical exam BP 130/81   Pulse 84   Temp 97.9 F (36.6 C)   Ht 5' 2 (1.575 m)   SpO2 95%   BMI 34.82 kg/m  General appearance: alert, cooperative, appears stated age, no distress, and moderately obese Head: Normocephalic, without obvious abnormality, atraumatic Eyes: negative findings: lids and lashes normal, conjunctivae and sclerae normal, corneas clear, and pupils equal, round, reactive to light and accomodation Ears: normal TM's and external ear canals both ears Nose: Nares normal. Septum midline. Mucosa normal. No drainage or sinus tenderness. Throat: lips, mucosa, and tongue normal; teeth and gums normal Neck: no adenopathy, no carotid bruit, supple, symmetrical, trachea midline, and thyroid  not enlarged, symmetric, no tenderness/mass/nodules Back: symmetric, no curvature. ROM normal. No CVA tenderness. Lungs: clear to auscultation bilaterally Heart: regular rate and rhythm, S1, S2 normal, no murmur, click, rub or gallop Abdomen: Obese, soft and nontender Extremities: extremities normal, atraumatic, no cyanosis or edema Pulses: 2+ and symmetric Skin: Skin color, texture, turgor normal. No rashes or lesions.  Has several seborrheic keratoses, pigmented nevi throughout the trunk.  Couple skin tags also noted Lymph nodes: No supraclavicular or anterior cervical lymphadenopathy Neurologic: Alert and oriented X 3, normal strength and tone. Normal symmetric reflexes. Normal coordination and gait      09/26/2023    3:17 PM 12/12/2022    2:41 PM 07/01/2022   10:15 AM  Depression screen PHQ 2/9  Decreased Interest 0 0 0  Down, Depressed, Hopeless 0 0 0  PHQ - 2 Score 0 0 0  Altered sleeping 0 0 0  Tired, decreased energy 0 0 0  Change in appetite 0 0 0  Feeling bad or failure about yourself  0 0 0  Trouble concentrating 0 0 0  Moving slowly or fidgety/restless 0 0 0  Suicidal thoughts 0 0 0  PHQ-9 Score 0  0  0   Difficult doing work/chores Not difficult at all Not  difficult at all      Data saved with a previous flowsheet row definition      09/26/2023    3:17 PM 12/12/2022    2:41 PM 07/01/2022   10:15 AM 05/07/2021    2:52 PM  GAD 7 : Generalized Anxiety Score  Nervous, Anxious, on Edge 0 0 0 0  Control/stop worrying 0 0 0 0  Worry too much - different things 0 0 0 0  Trouble relaxing 0 0 0 0  Restless 0 0 0 0  Easily annoyed or irritable 0 0 0 0  Afraid - awful might happen 0 0 0 0  Total GAD 7 Score 0 0 0 0  Anxiety Difficulty Not difficult at all Not difficult at all Not difficult at all Not difficult at all    No results found for this or any previous visit (from the past 2160 hours).   Assessment/  Plan: Tillman JAYSON Eis here for annual physical exam.   Annual physical exam  Diabetes mellitus treated with injections of non-insulin medication (HCC) - Plan: Bayer DCA Hb A1c Waived, Microalbumin / creatinine urine ratio, CMP14+EGFR, CBC with Differential  Hypertension associated with diabetes (HCC) - Plan: amLODipine  (NORVASC ) 10 MG tablet, metoprolol  succinate (TOPROL -XL) 25 MG 24 hr tablet, CMP14+EGFR  Hyperlipidemia associated with type 2 diabetes mellitus (HCC) - Plan: rosuvastatin  (CRESTOR ) 5 MG tablet, CMP14+EGFR, Lipid Panel  Acquired hypothyroidism - Plan: levothyroxine  (SYNTHROID ) 25 MCG tablet, CMP14+EGFR, TSH + free T4  Vitamin D  insufficiency - Plan: Vitamin D , Ergocalciferol , (DRISDOL ) 1.25 MG (50000 UNIT) CAPS capsule, CMP14+EGFR, VITAMIN D  25 Hydroxy (Vit-D Deficiency, Fractures)  Obesity (BMI 30.0-34.9) - Plan: CMP14+EGFR, VITAMIN D  25 Hydroxy (Vit-D Deficiency, Fractures)  Screen for colon cancer - Plan: Cologuard   Fasting labs collected.  She declines vaccinations.  Foot exam performed.  Urine microalbumin collected.  Has diabetic eye exam scheduled.  Recommended colonoscopy though she is not planning on getting this done due to previous rectal fissure that occurred.  She understands the risks of tobacco use,  foregoing colon cancer screening etc.  Continue Ozempic  as prescribed.  Recommend resuming use of rosuvastatin .  Blood pressure well-controlled.  Continue all medications as prescribed  Clinically euthyroid.  Check thyroid  levels  Check vitamin D  given history of insufficiency and obesity.  Vitamin D  renewed  Counseled on healthy lifestyle choices, including diet (rich in fruits, vegetables and lean meats and low in salt and simple carbohydrates) and exercise (at least 30 minutes of moderate physical activity daily).  Patient to follow up 6 months for interval checkup, sooner if concerns arise  Korver Graybeal M. Agam Tuohy, DO        [1]  Current Outpatient Medications:    LORazepam (ATIVAN) 0.5 MG tablet, Take 0.5 mg by mouth daily as needed for anxiety., Disp: , Rfl:    Semaglutide ,0.25 or 0.5MG /DOS, (OZEMPIC , 0.25 OR 0.5 MG/DOSE,) 2 MG/3ML SOPN, Inject 0.5 mg into the skin once a week. Novo nordisk patient assistance, Disp: , Rfl:    amLODipine  (NORVASC ) 10 MG tablet, Take 1 tablet (10 mg total) by mouth daily., Disp: 90 tablet, Rfl: 3   Blood Glucose Monitoring Suppl DEVI, May substitute to any manufacturer covered by patient's insurance. (Patient not taking: Reported on 04/16/2024), Disp: 1 each, Rfl: 0   Glucose Blood (BLOOD GLUCOSE TEST STRIPS) STRP, Check BGs daily. E11.9. May substitute to any manufacturer covered by patient's insurance. (Patient not taking: Reported on 04/16/2024), Disp: 100 strip, Rfl: PRN   hydrochlorothiazide  (HYDRODIURIL ) 25 MG tablet, Take 0.5-1 tablets (12.5-25 mg total) by mouth daily as needed (swelling). (Patient not taking: Reported on 04/16/2024), Disp: 90 tablet, Rfl: 3   Lancet Device MISC, Check BGs daily. E11.9. May substitute to any manufacturer covered by patient's insurance. (Patient not taking: Reported on 04/16/2024), Disp: 1 each, Rfl: 0   Lancets Misc. MISC, Check BGs daily. E11.9 May substitute to any manufacturer covered by patient's insurance.  (Patient not taking: Reported on 04/16/2024), Disp: 100 each, Rfl: PRN   levothyroxine  (SYNTHROID ) 25 MCG tablet, Take 1 tablet by mouth daily., Disp: 90 tablet, Rfl: 3   lisinopril  (ZESTRIL ) 40 MG tablet, Take 1 tablet (40 mg total) by mouth daily., Disp: 90 tablet, Rfl: 3   metoprolol  succinate (TOPROL -XL) 25 MG 24 hr tablet, Take 1 tablet (25 mg total) by mouth daily., Disp: 90 tablet, Rfl: 3   omeprazole  (PRILOSEC) 20 MG capsule, TAKE 1 CAPSULE BY  MOUTH TWICE DAILY BEFORE A MEAL, Disp: 180 capsule, Rfl: 3   rosuvastatin  (CRESTOR ) 5 MG tablet, Take 1 tablet (5 mg total) by mouth every other day., Disp: 90 tablet, Rfl: 3   sertraline  (ZOLOFT ) 100 MG tablet, TAKE 1 AND 1/2 TABLETS BY MOUTH DAILY., Disp: 135 tablet, Rfl: 3   Vitamin D , Ergocalciferol , (DRISDOL ) 1.25 MG (50000 UNIT) CAPS capsule, Take 1 capsule (50,000 Units total) by mouth every 7 (seven) days., Disp: 12 capsule, Rfl: 3 [2]  Allergies Allergen Reactions   Penicillins Anaphylaxis   Sulfa  Antibiotics Rash   "

## 2024-04-16 NOTE — Patient Instructions (Signed)
 OVERDUE FOR: Eye exam Colonoscopy mammogram

## 2024-04-17 LAB — MICROALBUMIN / CREATININE URINE RATIO
Creatinine, Urine: 175.4 mg/dL
Microalb/Creat Ratio: 6 mg/g{creat} (ref 0–29)
Microalbumin, Urine: 10.3 ug/mL

## 2024-04-24 ENCOUNTER — Ambulatory Visit: Payer: Self-pay | Admitting: Family Medicine

## 2024-04-24 ENCOUNTER — Other Ambulatory Visit

## 2024-04-24 LAB — BAYER DCA HB A1C WAIVED: HB A1C (BAYER DCA - WAIVED): 5.8 % — ABNORMAL HIGH (ref 4.8–5.6)

## 2024-04-25 LAB — LIPID PANEL
Chol/HDL Ratio: 3.8 ratio (ref 0.0–4.4)
Cholesterol, Total: 175 mg/dL (ref 100–199)
HDL: 46 mg/dL
LDL Chol Calc (NIH): 100 mg/dL — ABNORMAL HIGH (ref 0–99)
Triglycerides: 166 mg/dL — ABNORMAL HIGH (ref 0–149)
VLDL Cholesterol Cal: 29 mg/dL (ref 5–40)

## 2024-04-25 LAB — CBC WITH DIFFERENTIAL/PLATELET
Basophils Absolute: 0.1 x10E3/uL (ref 0.0–0.2)
Basos: 1 %
EOS (ABSOLUTE): 0.2 x10E3/uL (ref 0.0–0.4)
Eos: 2 %
Hematocrit: 53.9 % — ABNORMAL HIGH (ref 34.0–46.6)
Hemoglobin: 17.7 g/dL — ABNORMAL HIGH (ref 11.1–15.9)
Immature Grans (Abs): 0.1 x10E3/uL (ref 0.0–0.1)
Immature Granulocytes: 1 %
Lymphocytes Absolute: 1.8 x10E3/uL (ref 0.7–3.1)
Lymphs: 17 %
MCH: 30.9 pg (ref 26.6–33.0)
MCHC: 32.8 g/dL (ref 31.5–35.7)
MCV: 94 fL (ref 79–97)
Monocytes Absolute: 0.6 x10E3/uL (ref 0.1–0.9)
Monocytes: 6 %
Neutrophils Absolute: 7.5 x10E3/uL — ABNORMAL HIGH (ref 1.4–7.0)
Neutrophils: 73 %
Platelets: 357 x10E3/uL (ref 150–450)
RBC: 5.72 x10E6/uL — ABNORMAL HIGH (ref 3.77–5.28)
RDW: 12.3 % (ref 11.7–15.4)
WBC: 10.2 x10E3/uL (ref 3.4–10.8)

## 2024-04-25 LAB — CMP14+EGFR
ALT: 19 IU/L (ref 0–32)
AST: 25 IU/L (ref 0–40)
Albumin: 4 g/dL (ref 3.8–4.9)
Alkaline Phosphatase: 108 IU/L (ref 49–135)
BUN/Creatinine Ratio: 16 (ref 9–23)
BUN: 10 mg/dL (ref 6–24)
Bilirubin Total: 0.7 mg/dL (ref 0.0–1.2)
CO2: 23 mmol/L (ref 20–29)
Calcium: 9.9 mg/dL (ref 8.7–10.2)
Chloride: 98 mmol/L (ref 96–106)
Creatinine, Ser: 0.64 mg/dL (ref 0.57–1.00)
Globulin, Total: 3 g/dL (ref 1.5–4.5)
Glucose: 125 mg/dL — ABNORMAL HIGH (ref 70–99)
Potassium: 4.3 mmol/L (ref 3.5–5.2)
Sodium: 137 mmol/L (ref 134–144)
Total Protein: 7 g/dL (ref 6.0–8.5)
eGFR: 103 mL/min/1.73

## 2024-04-25 LAB — VITAMIN D 25 HYDROXY (VIT D DEFICIENCY, FRACTURES): Vit D, 25-Hydroxy: 61.7 ng/mL (ref 30.0–100.0)

## 2024-04-25 LAB — TSH+FREE T4
Free T4: 1.05 ng/dL (ref 0.82–1.77)
TSH: 2.46 u[IU]/mL (ref 0.450–4.500)

## 2024-10-11 ENCOUNTER — Ambulatory Visit: Admitting: Family Medicine
